# Patient Record
Sex: Female | Born: 1950 | Race: White | Hispanic: No | Marital: Single | Smoking: Never smoker
Health system: Southern US, Community
[De-identification: ages and names within clinical notes are randomized; demographics above are authoritative.]

## PROBLEM LIST (undated history)

## (undated) DIAGNOSIS — I2510 Atherosclerotic heart disease of native coronary artery without angina pectoris: Secondary | ICD-10-CM

## (undated) DIAGNOSIS — E782 Mixed hyperlipidemia: Secondary | ICD-10-CM

## (undated) DIAGNOSIS — I469 Cardiac arrest, cause unspecified: Secondary | ICD-10-CM

## (undated) DIAGNOSIS — M1990 Unspecified osteoarthritis, unspecified site: Secondary | ICD-10-CM

## (undated) DIAGNOSIS — IMO0001 Reserved for inherently not codable concepts without codable children: Secondary | ICD-10-CM

## (undated) DIAGNOSIS — J449 Chronic obstructive pulmonary disease, unspecified: Secondary | ICD-10-CM

## (undated) DIAGNOSIS — E039 Hypothyroidism, unspecified: Secondary | ICD-10-CM

## (undated) DIAGNOSIS — I219 Acute myocardial infarction, unspecified: Secondary | ICD-10-CM

## (undated) DIAGNOSIS — R0600 Dyspnea, unspecified: Secondary | ICD-10-CM

## (undated) DIAGNOSIS — I700 Atherosclerosis of aorta: Secondary | ICD-10-CM

## (undated) DIAGNOSIS — I10 Essential (primary) hypertension: Secondary | ICD-10-CM

## (undated) DIAGNOSIS — H9190 Unspecified hearing loss, unspecified ear: Secondary | ICD-10-CM

## (undated) DIAGNOSIS — K219 Gastro-esophageal reflux disease without esophagitis: Secondary | ICD-10-CM

## (undated) DIAGNOSIS — F319 Bipolar disorder, unspecified: Secondary | ICD-10-CM

## (undated) HISTORY — DX: Mixed hyperlipidemia: E78.2

## (undated) HISTORY — PX: TONSILLECTOMY: SUR1361

## (undated) HISTORY — PX: BACK SURGERY: SHX140

## (undated) HISTORY — DX: Chronic obstructive pulmonary disease, unspecified: J44.9

## (undated) HISTORY — DX: Atherosclerosis of aorta: I70.0

## (undated) HISTORY — PX: CERVICAL SPINE SURGERY: SHX589

## (undated) HISTORY — PX: CORONARY ANGIOPLASTY WITH STENT PLACEMENT: SHX49

## (undated) HISTORY — DX: Dyspnea, unspecified: R06.00

## (undated) HISTORY — PX: TOTAL ABDOMINAL HYSTERECTOMY W/ BILATERAL SALPINGOOPHORECTOMY: SHX83

## (undated) HISTORY — DX: Gastro-esophageal reflux disease without esophagitis: K21.9

## (undated) HISTORY — DX: Bipolar disorder, unspecified: F31.9

## (undated) HISTORY — DX: Essential (primary) hypertension: I10

---

## 2004-09-01 DIAGNOSIS — I469 Cardiac arrest, cause unspecified: Secondary | ICD-10-CM | POA: Insufficient documentation

## 2004-09-01 DIAGNOSIS — I219 Acute myocardial infarction, unspecified: Secondary | ICD-10-CM | POA: Insufficient documentation

## 2004-09-01 HISTORY — DX: Cardiac arrest, cause unspecified: I46.9

## 2004-09-01 HISTORY — DX: Acute myocardial infarction, unspecified: I21.9

## 2014-11-23 ENCOUNTER — Other Ambulatory Visit (HOSPITAL_COMMUNITY): Payer: Self-pay | Admitting: Neurological Surgery

## 2014-11-23 DIAGNOSIS — M810 Age-related osteoporosis without current pathological fracture: Secondary | ICD-10-CM

## 2014-12-05 ENCOUNTER — Ambulatory Visit (HOSPITAL_COMMUNITY)
Admission: RE | Admit: 2014-12-05 | Discharge: 2014-12-05 | Disposition: A | Discharge status: Home / Self Care | Payer: Medicare Other | Source: Ambulatory Visit | Attending: Internal Medicine | Admitting: Internal Medicine

## 2014-12-05 DIAGNOSIS — M810 Age-related osteoporosis without current pathological fracture: Secondary | ICD-10-CM | POA: Insufficient documentation

## 2015-03-07 ENCOUNTER — Other Ambulatory Visit: Payer: Self-pay | Admitting: Internal Medicine

## 2015-03-07 ENCOUNTER — Other Ambulatory Visit: Payer: Medicare Other

## 2015-03-07 DIAGNOSIS — M546 Pain in thoracic spine: Secondary | ICD-10-CM

## 2017-05-29 ENCOUNTER — Other Ambulatory Visit: Payer: Self-pay

## 2017-05-29 ENCOUNTER — Emergency Department (HOSPITAL_COMMUNITY): Payer: Medicare Other

## 2017-05-29 ENCOUNTER — Encounter (HOSPITAL_COMMUNITY): Payer: Self-pay | Admitting: *Deleted

## 2017-05-29 ENCOUNTER — Inpatient Hospital Stay (HOSPITAL_COMMUNITY)
Admission: EM | Admit: 2017-05-29 | Discharge: 2017-06-02 | DRG: 251 | Disposition: A | Discharge status: Home / Self Care | Payer: Medicare Other | Attending: Cardiology | Admitting: Cardiology

## 2017-05-29 DIAGNOSIS — G8929 Other chronic pain: Secondary | ICD-10-CM

## 2017-05-29 DIAGNOSIS — Z955 Presence of coronary angioplasty implant and graft: Secondary | ICD-10-CM

## 2017-05-29 DIAGNOSIS — I2510 Atherosclerotic heart disease of native coronary artery without angina pectoris: Secondary | ICD-10-CM | POA: Diagnosis present

## 2017-05-29 DIAGNOSIS — Z79899 Other long term (current) drug therapy: Secondary | ICD-10-CM | POA: Diagnosis not present

## 2017-05-29 DIAGNOSIS — G894 Chronic pain syndrome: Secondary | ICD-10-CM | POA: Diagnosis not present

## 2017-05-29 DIAGNOSIS — Z23 Encounter for immunization: Secondary | ICD-10-CM | POA: Diagnosis not present

## 2017-05-29 DIAGNOSIS — E039 Hypothyroidism, unspecified: Secondary | ICD-10-CM | POA: Diagnosis not present

## 2017-05-29 DIAGNOSIS — Z888 Allergy status to other drugs, medicaments and biological substances status: Secondary | ICD-10-CM

## 2017-05-29 DIAGNOSIS — Z91018 Allergy to other foods: Secondary | ICD-10-CM | POA: Diagnosis not present

## 2017-05-29 DIAGNOSIS — F319 Bipolar disorder, unspecified: Secondary | ICD-10-CM | POA: Diagnosis present

## 2017-05-29 DIAGNOSIS — I252 Old myocardial infarction: Secondary | ICD-10-CM

## 2017-05-29 DIAGNOSIS — Z8249 Family history of ischemic heart disease and other diseases of the circulatory system: Secondary | ICD-10-CM

## 2017-05-29 DIAGNOSIS — I200 Unstable angina: Secondary | ICD-10-CM

## 2017-05-29 DIAGNOSIS — E785 Hyperlipidemia, unspecified: Secondary | ICD-10-CM | POA: Diagnosis not present

## 2017-05-29 DIAGNOSIS — E782 Mixed hyperlipidemia: Secondary | ICD-10-CM | POA: Diagnosis present

## 2017-05-29 DIAGNOSIS — I25110 Atherosclerotic heart disease of native coronary artery with unstable angina pectoris: Principal | ICD-10-CM | POA: Diagnosis present

## 2017-05-29 DIAGNOSIS — Z9861 Coronary angioplasty status: Secondary | ICD-10-CM

## 2017-05-29 HISTORY — DX: Cardiac arrest, cause unspecified: I46.9

## 2017-05-29 HISTORY — DX: Hypothyroidism, unspecified: E03.9

## 2017-05-29 HISTORY — DX: Acute myocardial infarction, unspecified: I21.9

## 2017-05-29 HISTORY — DX: Reserved for inherently not codable concepts without codable children: IMO0001

## 2017-05-29 HISTORY — DX: Atherosclerotic heart disease of native coronary artery without angina pectoris: I25.10

## 2017-05-29 HISTORY — DX: Unspecified hearing loss, unspecified ear: H91.90

## 2017-05-29 LAB — I-STAT TROPONIN, ED: TROPONIN I, POC: 0 ng/mL (ref 0.00–0.08)

## 2017-05-29 LAB — BASIC METABOLIC PANEL
ANION GAP: 7 (ref 5–15)
BUN: 21 mg/dL — ABNORMAL HIGH (ref 6–20)
CO2: 25 mmol/L (ref 22–32)
Calcium: 9.2 mg/dL (ref 8.9–10.3)
Chloride: 104 mmol/L (ref 101–111)
Creatinine, Ser: 0.64 mg/dL (ref 0.44–1.00)
Glucose, Bld: 82 mg/dL (ref 65–99)
POTASSIUM: 3.9 mmol/L (ref 3.5–5.1)
SODIUM: 136 mmol/L (ref 135–145)

## 2017-05-29 LAB — CBC
HEMATOCRIT: 39.9 % (ref 36.0–46.0)
HEMOGLOBIN: 13 g/dL (ref 12.0–15.0)
MCH: 31.9 pg (ref 26.0–34.0)
MCHC: 32.6 g/dL (ref 30.0–36.0)
MCV: 98 fL (ref 78.0–100.0)
Platelets: 174 10*3/uL (ref 150–400)
RBC: 4.07 MIL/uL (ref 3.87–5.11)
RDW: 12.7 % (ref 11.5–15.5)
WBC: 3.8 10*3/uL — AB (ref 4.0–10.5)

## 2017-05-29 MED ORDER — ASPIRIN 81 MG PO CHEW: 324.0000 mg | CHEWABLE_TABLET | Freq: Once | ORAL | Status: DC

## 2017-05-29 MED ORDER — METHOCARBAMOL 750 MG PO TABS
375.0000 mg | ORAL_TABLET | Freq: Every day | ORAL | Status: DC
  Administered 2017-05-30 – 2017-06-02 (×21): 375 mg via ORAL
  Filled 2017-05-29 (×10): qty 1
  Filled 2017-05-29: qty 0.5
  Filled 2017-05-29 (×10): qty 1
  Filled 2017-05-29: qty 0.5
  Filled 2017-05-29 (×2): qty 1

## 2017-05-29 MED ORDER — ASPIRIN EC 81 MG PO TBEC
81.0000 mg | DELAYED_RELEASE_TABLET | Freq: Every day | ORAL | Status: DC
  Administered 2017-05-30 – 2017-06-02 (×4): 81 mg via ORAL
  Filled 2017-05-29 (×4): qty 1

## 2017-05-29 MED ORDER — CARVEDILOL 6.25 MG PO TABS
6.2500 mg | ORAL_TABLET | Freq: Two times a day (BID) | ORAL | Status: DC
  Administered 2017-05-30 – 2017-06-02 (×7): 6.25 mg via ORAL
  Filled 2017-05-29 (×7): qty 1

## 2017-05-29 MED ORDER — ESTRADIOL 1 MG PO TABS
0.5000 mg | ORAL_TABLET | Freq: Every morning | ORAL | Status: DC
  Administered 2017-05-30 – 2017-06-02 (×4): 0.5 mg via ORAL
  Filled 2017-05-29 (×4): qty 0.5

## 2017-05-29 MED ORDER — ONDANSETRON HCL 4 MG/2ML IJ SOLN: 4.0000 mg | Freq: Four times a day (QID) | INTRAMUSCULAR | Status: DC | PRN

## 2017-05-29 MED ORDER — HEPARIN (PORCINE) IN NACL 100-0.45 UNIT/ML-% IJ SOLN
850.0000 [IU]/h | INTRAMUSCULAR | Status: DC
  Administered 2017-05-29 – 2017-06-01 (×3): 850 [IU]/h via INTRAVENOUS
  Filled 2017-05-29 (×3): qty 250

## 2017-05-29 MED ORDER — NITROGLYCERIN IN D5W 200-5 MCG/ML-% IV SOLN
0.0000 ug/min | Freq: Once | INTRAVENOUS | Status: AC
Start: 2017-05-29 — End: 2017-05-29
  Administered 2017-05-29: 5 ug/min via INTRAVENOUS
  Filled 2017-05-29: qty 250

## 2017-05-29 MED ORDER — NITROGLYCERIN 0.4 MG SL SUBL
0.4000 mg | SUBLINGUAL_TABLET | SUBLINGUAL | Status: DC | PRN
  Administered 2017-05-31: 0.4 mg via SUBLINGUAL
  Filled 2017-05-29: qty 1

## 2017-05-29 MED ORDER — ATORVASTATIN CALCIUM 40 MG PO TABS
40.0000 mg | ORAL_TABLET | Freq: Every day | ORAL | Status: DC
  Administered 2017-05-30 – 2017-06-01 (×3): 40 mg via ORAL
  Filled 2017-05-29 (×2): qty 1

## 2017-05-29 MED ORDER — PNEUMOCOCCAL VAC POLYVALENT 25 MCG/0.5ML IJ INJ
0.5000 mL | INJECTION | INTRAMUSCULAR | Status: AC
  Administered 2017-05-30: 0.5 mL via INTRAMUSCULAR
  Filled 2017-05-29: qty 0.5

## 2017-05-29 MED ORDER — ACETAMINOPHEN 325 MG PO TABS
650.0000 mg | ORAL_TABLET | ORAL | Status: DC | PRN
  Administered 2017-05-29 – 2017-05-31 (×2): 650 mg via ORAL
  Filled 2017-05-29 (×2): qty 2

## 2017-05-29 MED ORDER — INFLUENZA VAC SPLIT HIGH-DOSE 0.5 ML IM SUSY
0.5000 mL | PREFILLED_SYRINGE | INTRAMUSCULAR | Status: AC
  Administered 2017-05-30: 0.5 mL via INTRAMUSCULAR
  Filled 2017-05-29: qty 0.5

## 2017-05-29 MED ORDER — HYDROCODONE-ACETAMINOPHEN 10-325 MG PO TABS: 1.0000 | ORAL_TABLET | ORAL | Status: DC | PRN

## 2017-05-29 MED ORDER — SERTRALINE HCL 100 MG PO TABS
100.0000 mg | ORAL_TABLET | Freq: Two times a day (BID) | ORAL | Status: DC
  Administered 2017-05-30 – 2017-06-02 (×7): 100 mg via ORAL
  Filled 2017-05-29 (×7): qty 1

## 2017-05-29 MED ORDER — DEXTROAMPHETAMINE SULFATE 5 MG PO TABS
10.0000 mg | ORAL_TABLET | Freq: Three times a day (TID) | ORAL | Status: DC
  Administered 2017-05-30 – 2017-06-02 (×10): 10 mg via ORAL
  Filled 2017-05-29 (×10): qty 2

## 2017-05-29 MED ORDER — HEPARIN BOLUS VIA INFUSION
4000.0000 [IU] | Freq: Once | INTRAVENOUS | Status: AC
  Administered 2017-05-29: 4000 [IU] via INTRAVENOUS
  Filled 2017-05-29: qty 4000
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
## 2017-05-30 ENCOUNTER — Encounter (HOSPITAL_COMMUNITY): Payer: Self-pay | Admitting: Cardiology

## 2017-05-30 DIAGNOSIS — E039 Hypothyroidism, unspecified: Secondary | ICD-10-CM | POA: Diagnosis present

## 2017-05-30 DIAGNOSIS — E782 Mixed hyperlipidemia: Secondary | ICD-10-CM | POA: Diagnosis present

## 2017-05-30 DIAGNOSIS — I2510 Atherosclerotic heart disease of native coronary artery without angina pectoris: Secondary | ICD-10-CM | POA: Insufficient documentation

## 2017-05-30 DIAGNOSIS — F319 Bipolar disorder, unspecified: Secondary | ICD-10-CM | POA: Diagnosis present

## 2017-05-30 DIAGNOSIS — E785 Hyperlipidemia, unspecified: Secondary | ICD-10-CM | POA: Diagnosis present

## 2017-05-30 HISTORY — DX: Atherosclerotic heart disease of native coronary artery without angina pectoris: I25.10

## 2017-05-30 LAB — CBC
HCT: 42 % (ref 36.0–46.0)
HEMATOCRIT: 39.8 % (ref 36.0–46.0)
HEMOGLOBIN: 13.2 g/dL (ref 12.0–15.0)
Hemoglobin: 13.8 g/dL (ref 12.0–15.0)
MCH: 32.7 pg (ref 26.0–34.0)
MCH: 32.2 pg (ref 26.0–34.0)
MCHC: 33.2 g/dL (ref 30.0–36.0)
MCHC: 32.9 g/dL (ref 30.0–36.0)
MCV: 98.5 fL (ref 78.0–100.0)
MCV: 98.1 fL (ref 78.0–100.0)
PLATELETS: 170 10*3/uL (ref 150–400)
Platelets: 150 10*3/uL (ref 150–400)
RBC: 4.04 MIL/uL (ref 3.87–5.11)
RBC: 4.28 MIL/uL (ref 3.87–5.11)
RDW: 12.5 % (ref 11.5–15.5)
RDW: 12.6 % (ref 11.5–15.5)
WBC: 3.9 10*3/uL — ABNORMAL LOW (ref 4.0–10.5)
WBC: 3.9 10*3/uL — ABNORMAL LOW (ref 4.0–10.5)

## 2017-05-30 LAB — BASIC METABOLIC PANEL
ANION GAP: 10 (ref 5–15)
BUN: 18 mg/dL (ref 6–20)
CHLORIDE: 104 mmol/L (ref 101–111)
CO2: 24 mmol/L (ref 22–32)
CREATININE: 0.6 mg/dL (ref 0.44–1.00)
Calcium: 8.9 mg/dL (ref 8.9–10.3)
GFR calc non Af Amer: >60 mL/min (ref >60)
Glucose, Bld: 101 mg/dL — ABNORMAL HIGH (ref 65–99)
Potassium: 3.9 mmol/L (ref 3.5–5.1)
SODIUM: 138 mmol/L (ref 135–145)

## 2017-05-30 LAB — TROPONIN I
Troponin I: <0.03 ng/mL (ref <0.03)
Troponin I: <0.03 ng/mL (ref <0.03)

## 2017-05-30 LAB — LIPID PANEL
Cholesterol: 195 mg/dL (ref 0–200)
HDL: 62 mg/dL (ref >40)
LDL Cholesterol: 123 mg/dL — ABNORMAL HIGH (ref 0–99)
Total CHOL/HDL Ratio: 3.1 RATIO
Triglycerides: 49 mg/dL (ref <150)
VLDL: 10 mg/dL (ref 0–40)

## 2017-05-30 LAB — PROTIME-INR
INR: 1.05
PROTHROMBIN TIME: 13.6 s (ref 11.4–15.2)

## 2017-05-30 LAB — HEPARIN LEVEL (UNFRACTIONATED)
HEPARIN UNFRACTIONATED: 0.62 [IU]/mL (ref 0.30–0.70)
Heparin Unfractionated: 0.52 IU/mL (ref 0.30–0.70)

## 2017-05-30 LAB — MRSA PCR SCREENING: MRSA by PCR: NEGATIVE

## 2017-05-30 MED ORDER — GABAPENTIN 100 MG PO CAPS
100.0000 mg | ORAL_CAPSULE | Freq: Three times a day (TID) | ORAL | Status: DC
  Administered 2017-05-30 – 2017-06-02 (×10): 100 mg via ORAL
  Filled 2017-05-30 (×11): qty 1

## 2017-05-30 MED ORDER — VALACYCLOVIR HCL 500 MG PO TABS
500.0000 mg | ORAL_TABLET | Freq: Two times a day (BID) | ORAL | Status: DC
  Administered 2017-05-30 – 2017-06-02 (×7): 500 mg via ORAL
  Filled 2017-05-30 (×8): qty 1

## 2017-05-30 MED ORDER — DIPHENHYDRAMINE HCL 25 MG PO CAPS
25.0000 mg | ORAL_CAPSULE | Freq: Once | ORAL | Status: AC
  Administered 2017-05-30: 25 mg via ORAL
  Filled 2017-05-30: qty 1

## 2017-05-30 NOTE — Progress Notes (Signed)
Md notified pt c/o itching at shot location on right arm.  Raised area.  Pt requesting Benadryl.  Will continue to monitor. Karena Addison T

## 2017-05-30 NOTE — Progress Notes (Signed)
Notified Md about pt's home medications that is not ordered.  Will continue to monitor Deborah [name]

## 2017-05-30 NOTE — Progress Notes (Signed)
Subjective:  Comes in yesterday with complaints of unstable angina.  She has a chronic pain syndrome and is very difficult to evaluate normally.  I have seen her sporadically in the office.  Currently pain-free but feels as if it might be starting back up again.  Objective:  Vital Signs in the last 24 hours: BP 121/71 (BP Location: Left Arm)   Pulse 73   Temp 97.8 F (36.6 C) (Oral)   Resp 19   Ht  (1.626 m)   Wt 63.9 kg (140 lb 14.4 oz)   SpO2 95%   BMI 24.19 kg/m   Physical Exam: Talkative white female lying in bed in no acute distress Lungs:  Clear Cardiac:  Regular rhythm, normal S1 and S2, no S3 Extremities:  No edema present, radial pulse strong on the right  Intake/Output from previous day: 09/28 0701 - 09/29 0700 In: 328.3 [P.O.:240; I.V.:88.3] Out: 402 [Urine:401; Stool:1]  Weight Filed Weights   05/29/17 1759 05/29/17 2254 05/30/17 0653  Weight: 65.3 kg (144 lb) 64.8 kg (142 lb 13.7 oz) 63.9 kg (140 lb 14.4 oz)    Lab Results: Basic Metabolic Panel:  Recent Labs  40/98/11 1420 05/30/17 0621  NA 136 138  K 3.9 3.9  CL 104 104  CO2 25 24  GLUCOSE 82 101*  BUN 21* 18  CREATININE 0.64 0.60   CBC:  Recent Labs  05/30/17 0236 05/30/17 0621  WBC 3.9* 3.9*  HGB 13.2 13.8  HCT 39.8 42.0  MCV 98.5 98.1  PLT 150 170   Cardiac Enzymes: Troponin (Point of Care Test)  Recent Labs  05/29/17 1448  TROPIPOC 0.00   Cardiac Panel (last 3 results)  Recent Labs  05/30/17 0024 05/30/17 0621 05/30/17 1014  TROPONINI <0.03 <0.03 <0.03    Telemetry: Personally reviewed, sinus rhythm  Assessment/Plan:  1. Chest pain suggestive of unstable angina 2.  Prior history of anterior infarction treated with drug-eluting stent to the LAD in 2006 in the setting of ventricular fibrillation 3. Bipolar disorder 4.  Chronic pain syndrome  Recommendations:  With or suggestive history of think she needs to have a repeat catheterization.  She had a normal  myocardial perfusion scan in March 2017 with no ischemia and EF of 62%.     Darden Palmer  MD [facility] Cardiology  05/30/2017, 5:18 PM

## 2017-05-30 NOTE — Progress Notes (Signed)
ANTICOAGULATION CONSULT NOTE  Pharmacy Consult for heparin Indication: chest pain/ACS  Allergies  Allergen Reactions  . Chocolate Anaphylaxis, Itching and Swelling  . Meloxicam Swelling    Face swells and flushes it also  . Demerol [Meperidine] Nausea Only  . Ibuprofen Other (See Comments)    Extreme heartburn results if not accompanied by an antacid    Patient Measurements: Height:  (162.6 cm) Weight: 142 lb 13.7 oz (64.8 kg) IBW/kg (Calculated) : 54.7 Heparin Dosing Weight: 65.3 kg  Vital Signs: Temp: 98.2 F (36.8 C) (09/29 0300) Temp Source: Oral (09/29 0300) BP: 137/84 (09/29 0300) Pulse Rate: 69 (09/29 0300)  Labs:  Recent Labs  05/29/17 1420 05/30/17 0024 05/30/17 0236  HGB 13.0  --  13.2  HCT 39.9  --  39.8  PLT 174  --  150  HEPARINUNFRC  --   --  0.52  CREATININE 0.64  --   --   TROPONINI  --  <0.03  --     Estimated Creatinine Clearance: 58.1 mL/min (by C-G formula based on SCr of 0.64 mg/dL).  Assessment: 66 y.o. female with chest pain for heparin   Goal of Therapy:  Heparin level 0.3-0.7 units/ml Monitor platelets by anticoagulation protocol: Yes     Plan:  Continue Heparin at current rate   Deborah [name], Deborah [name] 05/30/2017,3:55 AM

## 2017-05-30 NOTE — Progress Notes (Signed)
ANTICOAGULATION CONSULT NOTE  Pharmacy Consult for heparin Indication: chest pain/ACS  Allergies  Allergen Reactions  . Chocolate Anaphylaxis, Itching and Swelling  . Meloxicam Swelling    Face swells and flushes it also  . Demerol [Meperidine] Nausea Only  . Ibuprofen Other (See Comments)    Extreme heartburn results if not accompanied by an antacid    Patient Measurements: Height:  (162.6 cm) Weight: 140 lb 14.4 oz (63.9 kg) IBW/kg (Calculated) : 54.7 Heparin Dosing Weight: 65.3 kg  Vital Signs: Temp: 97.6 F (36.4 C) (09/29 0800) Temp Source: Axillary (09/29 0800) BP: 105/85 (09/29 0600) Pulse Rate: 68 (09/29 0600)  Labs:  Recent Labs  05/29/17 1420 05/30/17 0024 05/30/17 0236 05/30/17 0621 05/30/17 1014  HGB 13.0  --  13.2 13.8  --   HCT 39.9  --  39.8 42.0  --   PLT 174  --  150 170  --   LABPROT  --   --   --  13.6  --   INR  --   --   --  1.05  --   HEPARINUNFRC  --   --  0.52  --  0.62  CREATININE 0.64  --   --  0.60  --   TROPONINI  --  <0.03  --  <0.03 <0.03    Estimated Creatinine Clearance: 58.1 mL/min (by C-G formula based on SCr of 0.6 mg/dL).  Assessment: 66 y.o. female with chest pain for heparin.  Heparin level continues to be at goal this morning at 0.6. CBC stable, no bleeding noted.  Goal of Therapy:  Heparin level 0.3-0.7 units/ml Monitor platelets by anticoagulation protocol: Yes     Plan:  Continue Heparin at current rate   Sheppard Coil PharmD., BCPS Clinical Pharmacist Pager [phone] 05/30/2017 11:59 AM

## 2017-05-31 LAB — CBC
HEMATOCRIT: 41 % (ref 36.0–46.0)
HEMOGLOBIN: 13.3 g/dL (ref 12.0–15.0)
MCH: 31.8 pg (ref 26.0–34.0)
MCHC: 32.4 g/dL (ref 30.0–36.0)
MCV: 98.1 fL (ref 78.0–100.0)
Platelets: 154 10*3/uL (ref 150–400)
RBC: 4.18 MIL/uL (ref 3.87–5.11)
RDW: 12.6 % (ref 11.5–15.5)
WBC: 6.5 10*3/uL (ref 4.0–10.5)

## 2017-05-31 LAB — HEPARIN LEVEL (UNFRACTIONATED): HEPARIN UNFRACTIONATED: 0.57 [IU]/mL (ref 0.30–0.70)

## 2017-05-31 MED ORDER — SODIUM CHLORIDE 0.9 % WEIGHT BASED INFUSION
3.0000 mL/kg/h | INTRAVENOUS | Status: DC
  Administered 2017-06-01: 3 mL/kg/h via INTRAVENOUS

## 2017-05-31 MED ORDER — SODIUM CHLORIDE 0.9 % WEIGHT BASED INFUSION: 1.0000 mL/kg/h | INTRAVENOUS | Status: DC

## 2017-05-31 MED ORDER — ASPIRIN 81 MG PO CHEW
81.0000 mg | CHEWABLE_TABLET | ORAL | Status: AC
  Administered 2017-06-01: 81 mg via ORAL
  Filled 2017-05-31: qty 1

## 2017-05-31 MED ORDER — SODIUM CHLORIDE 0.9% FLUSH
3.0000 mL | Freq: Two times a day (BID) | INTRAVENOUS | Status: DC
  Administered 2017-05-31: 3 mL via INTRAVENOUS

## 2017-05-31 MED ORDER — SODIUM CHLORIDE 0.9 % IV SOLN: 250.0000 mL | INTRAVENOUS | Status: DC | PRN

## 2017-05-31 MED ORDER — SODIUM CHLORIDE 0.9% FLUSH: 3.0000 mL | INTRAVENOUS | Status: DC | PRN

## 2017-05-31 NOTE — Progress Notes (Signed)
ANTICOAGULATION CONSULT NOTE  Pharmacy Consult for heparin Indication: chest pain/ACS  Allergies  Allergen Reactions  . Chocolate Anaphylaxis, Itching and Swelling  . Meloxicam Swelling    Face swells and flushes it also  . Demerol [Meperidine] Nausea Only  . Ibuprofen Other (See Comments)    Extreme heartburn results if not accompanied by an antacid    Patient Measurements: Height:  (162.6 cm) Weight: 140 lb 14.4 oz (63.9 kg) IBW/kg (Calculated) : 54.7 Heparin Dosing Weight: 65.3 kg  Vital Signs: Temp: 98.4 F (36.9 C) (09/30 0814) Temp Source: Oral (09/30 0814) BP: 134/84 (09/30 0321) Pulse Rate: 87 (09/30 0321)  Labs:  Recent Labs  05/29/17 1420 05/30/17 0024 05/30/17 0236 05/30/17 0621 05/30/17 1014 05/31/17 0302  HGB 13.0  --  13.2 13.8  --  13.3  HCT 39.9  --  39.8 42.0  --  41.0  PLT 174  --  150 170  --  154  LABPROT  --   --   --  13.6  --   --   INR  --   --   --  1.05  --   --   HEPARINUNFRC  --   --  0.52  --  0.62 0.57  CREATININE 0.64  --   --  0.60  --   --   TROPONINI  --  <0.03  --  <0.03 <0.03  --     Estimated Creatinine Clearance: 58.1 mL/min (by C-G formula based on SCr of 0.6 mg/dL).  Assessment: 66 y.o. female with chest pain for heparin.  Heparin level continues to be at goal this morning at 0.5. CBC stable, no bleeding noted.  Goal of Therapy:  Heparin level 0.3-0.7 units/ml Monitor platelets by anticoagulation protocol: Yes     Plan:  Continue Heparin at current rate   Sheppard Coil PharmD., BCPS Clinical Pharmacist Pager [phone] 05/31/2017 9:29 AM

## 2017-05-31 NOTE — Progress Notes (Signed)
Subjective:  On heparin and IV nitroglycerin.  Had some chest pain earlier with left arm numbness that has resolved.  Awaiting catheterization tomorrow.  Objective:  Vital Signs in the last 24 hours: BP 134/84 (BP Location: Left Arm)   Pulse 87   Temp (!) 97.5 F (36.4 C) (Oral)   Resp 18   Ht  (1.626 m)   Wt 63.9 kg (140 lb 14.4 oz)   SpO2 99%   BMI 24.19 kg/m   Physical Exam: Talkative white female lying in bed in no acute distress Lungs:  Clear Cardiac:  Regular rhythm, normal S1 and S2, no S3 Extremities:  No edema present, radial pulse strong on the right  Intake/Output from previous day: 09/29 0701 - 09/30 0700 In: 675.5 [P.O.:480; I.V.:195.5] Out: 1200 [Urine:1200]  Weight Filed Weights   05/29/17 1759 05/29/17 2254 05/30/17 0653  Weight: 65.3 kg (144 lb) 64.8 kg (142 lb 13.7 oz) 63.9 kg (140 lb 14.4 oz)    Lab Results: Basic Metabolic Panel:  Recent Labs  16/10/96 1420 05/30/17 0621  NA 136 138  K 3.9 3.9  CL 104 104  CO2 25 24  GLUCOSE 82 101*  BUN 21* 18  CREATININE 0.64 0.60   CBC:  Recent Labs  05/30/17 0621 05/31/17 0302  WBC 3.9* 6.5  HGB 13.8 13.3  HCT 42.0 41.0  MCV 98.1 98.1  PLT 170 154   Cardiac Enzymes: Troponin (Point of Care Test)  Recent Labs  05/29/17 1448  TROPIPOC 0.00   Cardiac Panel (last 3 results)  Recent Labs  05/30/17 0024 05/30/17 0621 05/30/17 1014  TROPONINI <0.03 <0.03 <0.03    Telemetry: Personally reviewed, sinus rhythm  Assessment/Plan:  1. Chest pain suggestive of unstable angina 2.  Prior history of anterior infarction treated with drug-eluting stent to the LAD in 2006 in the setting of ventricular fibrillation 3. Bipolar disorder 4.  Chronic pain syndrome  Recommendations:  Cardiac catheterization was discussed with the patient fully including risks of myocardial infarction, death, stroke, bleeding, arrhythmia, dye allergy, renal insufficiency or bleeding.  The patient understands  and is willing to proceed.  Possibility of intervention at the same time also discussed with patient and she understands and is agreeable to proceed    W. Ashley Royalty  MD [facility]  05/31/2017, 12:54 PM

## 2017-06-01 ENCOUNTER — Other Ambulatory Visit: Payer: Self-pay

## 2017-06-01 ENCOUNTER — Encounter (HOSPITAL_COMMUNITY)
Admission: EM | Disposition: A | Discharge status: Home / Self Care | Payer: Self-pay | Source: Home / Self Care | Attending: Cardiology

## 2017-06-01 HISTORY — PX: CORONARY BALLOON ANGIOPLASTY: CATH118233

## 2017-06-01 HISTORY — PX: LEFT HEART CATH AND CORONARY ANGIOGRAPHY: CATH118249

## 2017-06-01 LAB — POCT ACTIVATED CLOTTING TIME: ACTIVATED CLOTTING TIME: 466 s

## 2017-06-01 LAB — CBC
HCT: 41.4 % (ref 36.0–46.0)
Hemoglobin: 13.6 g/dL (ref 12.0–15.0)
MCH: 32.3 pg (ref 26.0–34.0)
MCHC: 32.9 g/dL (ref 30.0–36.0)
MCV: 98.3 fL (ref 78.0–100.0)
Platelets: 166 10*3/uL (ref 150–400)
RBC: 4.21 MIL/uL (ref 3.87–5.11)
RDW: 12.7 % (ref 11.5–15.5)
WBC: 5.1 10*3/uL (ref 4.0–10.5)

## 2017-06-01 LAB — HEPARIN LEVEL (UNFRACTIONATED): HEPARIN UNFRACTIONATED: 0.47 [IU]/mL (ref 0.30–0.70)

## 2017-06-01 SURGERY — LEFT HEART CATH AND CORONARY ANGIOGRAPHY
Anesthesia: LOCAL

## 2017-06-01 MED ORDER — SODIUM CHLORIDE 0.9 % IV SOLN: 250.0000 mL | INTRAVENOUS | Status: DC | PRN

## 2017-06-01 MED ORDER — FENTANYL CITRATE (PF) 100 MCG/2ML IJ SOLN
INTRAMUSCULAR | Status: DC | PRN
  Administered 2017-06-01: 25 ug via INTRAVENOUS

## 2017-06-01 MED ORDER — FENTANYL CITRATE (PF) 100 MCG/2ML IJ SOLN
INTRAMUSCULAR | Status: AC
  Filled 2017-06-01: qty 2

## 2017-06-01 MED ORDER — HEPARIN (PORCINE) IN NACL 2-0.9 UNIT/ML-% IJ SOLN
INTRAMUSCULAR | Status: DC | PRN
  Administered 2017-06-01: 10 mL via INTRA_ARTERIAL

## 2017-06-01 MED ORDER — VERAPAMIL HCL 2.5 MG/ML IV SOLN
INTRAVENOUS | Status: AC
  Filled 2017-06-01: qty 2

## 2017-06-01 MED ORDER — ONDANSETRON HCL 4 MG/2ML IJ SOLN: 4.0000 mg | Freq: Four times a day (QID) | INTRAMUSCULAR | Status: DC | PRN

## 2017-06-01 MED ORDER — HEPARIN SODIUM (PORCINE) 1000 UNIT/ML IJ SOLN
INTRAMUSCULAR | Status: AC
  Filled 2017-06-01: qty 1

## 2017-06-01 MED ORDER — ACETAMINOPHEN 325 MG PO TABS
650.0000 mg | ORAL_TABLET | ORAL | Status: DC | PRN
Start: 2017-06-01 — End: 2017-06-02
  Administered 2017-06-01: 650 mg via ORAL
  Filled 2017-06-01: qty 2

## 2017-06-01 MED ORDER — BIVALIRUDIN BOLUS VIA INFUSION - CUPID
INTRAVENOUS | Status: DC | PRN
  Administered 2017-06-01: 46.95 mg via INTRAVENOUS

## 2017-06-01 MED ORDER — LABETALOL HCL 5 MG/ML IV SOLN: 10.0000 mg | INTRAVENOUS | Status: AC | PRN

## 2017-06-01 MED ORDER — DIAZEPAM 5 MG PO TABS: 5.0000 mg | ORAL_TABLET | Freq: Four times a day (QID) | ORAL | Status: DC | PRN

## 2017-06-01 MED ORDER — CLOPIDOGREL BISULFATE 75 MG PO TABS
75.0000 mg | ORAL_TABLET | Freq: Every day | ORAL | Status: DC
  Administered 2017-06-02: 75 mg via ORAL
  Filled 2017-06-01: qty 1

## 2017-06-01 MED ORDER — SODIUM CHLORIDE 0.9 % IV SOLN
INTRAVENOUS | Status: AC | PRN
  Administered 2017-06-01: 100 mL/h via INTRAVENOUS

## 2017-06-01 MED ORDER — SODIUM CHLORIDE 0.9 % IV SOLN
INTRAVENOUS | Status: DC
  Administered 2017-06-01: 18:00:00 via INTRAVENOUS

## 2017-06-01 MED ORDER — NITROGLYCERIN 1 MG/10 ML FOR IR/CATH LAB
INTRA_ARTERIAL | Status: AC
  Filled 2017-06-01: qty 10

## 2017-06-01 MED ORDER — SODIUM CHLORIDE 0.9% FLUSH: 3.0000 mL | INTRAVENOUS | Status: DC | PRN

## 2017-06-01 MED ORDER — IOPAMIDOL (ISOVUE-370) INJECTION 76%
INTRAVENOUS | Status: AC
  Filled 2017-06-01: qty 100

## 2017-06-01 MED ORDER — MIDAZOLAM HCL 2 MG/2ML IJ SOLN
INTRAMUSCULAR | Status: DC | PRN
  Administered 2017-06-01: 1 mg via INTRAVENOUS

## 2017-06-01 MED ORDER — SODIUM CHLORIDE 0.9% FLUSH
3.0000 mL | Freq: Two times a day (BID) | INTRAVENOUS | Status: DC
  Administered 2017-06-01: 3 mL via INTRAVENOUS

## 2017-06-01 MED ORDER — LIDOCAINE HCL 2 % IJ SOLN
INTRAMUSCULAR | Status: AC
  Filled 2017-06-01: qty 10

## 2017-06-01 MED ORDER — MIDAZOLAM HCL 2 MG/2ML IJ SOLN
INTRAMUSCULAR | Status: AC
  Filled 2017-06-01: qty 2

## 2017-06-01 MED ORDER — NITROGLYCERIN 1 MG/10 ML FOR IR/CATH LAB
INTRA_ARTERIAL | Status: DC | PRN
  Administered 2017-06-01 (×2): 200 ug via INTRACORONARY

## 2017-06-01 MED ORDER — BIVALIRUDIN TRIFLUOROACETATE 250 MG IV SOLR
INTRAVENOUS | Status: AC
  Filled 2017-06-01: qty 250

## 2017-06-01 MED ORDER — CLOPIDOGREL BISULFATE 300 MG PO TABS
ORAL_TABLET | ORAL | Status: AC
  Filled 2017-06-01: qty 2

## 2017-06-01 MED ORDER — HEPARIN (PORCINE) IN NACL 2-0.9 UNIT/ML-% IJ SOLN
INTRAMUSCULAR | Status: AC
  Filled 2017-06-01: qty 1000

## 2017-06-01 MED ORDER — CLOPIDOGREL BISULFATE 300 MG PO TABS
ORAL_TABLET | ORAL | Status: DC | PRN
  Administered 2017-06-01: 600 mg via ORAL

## 2017-06-01 MED ORDER — LIDOCAINE HCL (PF) 1 % IJ SOLN
INTRAMUSCULAR | Status: DC | PRN
  Administered 2017-06-01: 2 mL

## 2017-06-01 MED ORDER — HYDRALAZINE HCL 20 MG/ML IJ SOLN: 5.0000 mg | INTRAMUSCULAR | Status: AC | PRN

## 2017-06-01 MED ORDER — SODIUM CHLORIDE 0.9 % IV SOLN
INTRAVENOUS | Status: AC | PRN
  Administered 2017-06-01: 1.75 mg/kg/h via INTRAVENOUS

## 2017-06-01 MED ORDER — HEPARIN SODIUM (PORCINE) 1000 UNIT/ML IJ SOLN
INTRAMUSCULAR | Status: DC | PRN
  Administered 2017-06-01: 3000 [IU] via INTRAVENOUS
  Administered 2017-06-01: 1000 [IU] via INTRAVENOUS

## 2017-06-01 MED ORDER — ASPIRIN 81 MG PO CHEW: 81.0000 mg | CHEWABLE_TABLET | Freq: Every day | ORAL | Status: DC

## 2017-06-01 SURGICAL SUPPLY — 18 items
BALLN WOLVERINE 2.50X10 (BALLOONS) ×2
BALLN WOLVERINE 3.00X10 (BALLOONS) ×2
BALLOON WOLVERINE 2.50X10 (BALLOONS) ×1 IMPLANT
BALLOON WOLVERINE 3.00X10 (BALLOONS) ×1 IMPLANT
CATH INFINITI 5FR ANG PIGTAIL (CATHETERS) ×2 IMPLANT
CATH OPTITORQUE TIG 4.0 5F (CATHETERS) ×2 IMPLANT
CATH VISTA GUIDE 6FR XBLAD3.5 (CATHETERS) ×2 IMPLANT
DEVICE RAD COMP TR BAND LRG (VASCULAR PRODUCTS) ×2 IMPLANT
DEVICE RAD TR BAND REGULAR (VASCULAR PRODUCTS) ×2 IMPLANT
GLIDESHEATH SLEND SS 6F .021 (SHEATH) ×2 IMPLANT
GUIDEWIRE INQWIRE 1.5J.035X260 (WIRE) ×1 IMPLANT
INQWIRE 1.5J .035X260CM (WIRE) ×2
KIT ENCORE 26 ADVANTAGE (KITS) ×2 IMPLANT
KIT HEART LEFT (KITS) ×2 IMPLANT
PACK CARDIAC CATHETERIZATION (CUSTOM PROCEDURE TRAY) ×2 IMPLANT
TRANSDUCER W/STOPCOCK (MISCELLANEOUS) ×2 IMPLANT
TUBING CIL FLEX 10 FLL-RA (TUBING) ×2 IMPLANT
WIRE MARVEL STR TIP 190CM (WIRE) ×2 IMPLANT

## 2017-06-01 NOTE — Progress Notes (Addendum)
ANTICOAGULATION CONSULT NOTE  Pharmacy Consult for heparin Indication: chest pain/ACS  Allergies  Allergen Reactions  . Chocolate Anaphylaxis, Itching and Swelling  . Meloxicam Swelling    Face swells and flushes it also  . Demerol [Meperidine] Nausea Only  . Ibuprofen Other (See Comments)    Extreme heartburn results if not accompanied by an antacid    Patient Measurements: Height:  (162.6 cm) Weight: 137 lb 14.4 oz (62.6 kg) IBW/kg (Calculated) : 54.7 Heparin Dosing Weight: 65.3 kg  Vital Signs: Temp: 98.3 F (36.8 C) (10/01 0715) Temp Source: Oral (10/01 0715) BP: 115/77 (10/01 0715) Pulse Rate: 86 (10/01 0715)  Labs:  Recent Labs  05/29/17 1420 05/30/17 0024  05/30/17 0621 05/30/17 1014 05/31/17 0302 06/01/17 0139 06/01/17 0307  HGB 13.0  --   < > 13.8  --  13.3  --  13.6  HCT 39.9  --   < > 42.0  --  41.0  --  41.4  PLT 174  --   < > 170  --  154  --  166  LABPROT  --   --   --  13.6  --   --   --   --   INR  --   --   --  1.05  --   --   --   --   HEPARINUNFRC  --   --   < >  --  0.62 0.57 0.47  --   CREATININE 0.64  --   --  0.60  --   --   --   --   TROPONINI  --  <0.03  --  <0.03 <0.03  --   --   --   < > = values in this interval not displayed.  Estimated Creatinine Clearance: 58.1 mL/min (by C-G formula based on SCr of 0.6 mg/dL).  Assessment: 66 y.o. female with chest pain for heparin.  Heparin level remains therapeutic this am at 0.47. CBC stable. Noted plans for cath today.   Goal of Therapy:  Heparin level 0.3-0.7 units/ml Monitor platelets by anticoagulation protocol: Yes     Plan:  1. Continue heparin infusion at 850 units/hr  2. Daily heparin level  3. Noted plans for cath today   Pollyann Samples, PharmD, BCPS 06/01/2017, 8:05 AM

## 2017-06-02 ENCOUNTER — Encounter (HOSPITAL_COMMUNITY): Payer: Self-pay | Admitting: Cardiovascular Disease

## 2017-06-02 DIAGNOSIS — G894 Chronic pain syndrome: Secondary | ICD-10-CM

## 2017-06-02 HISTORY — DX: Chronic pain syndrome: G89.4

## 2017-06-02 LAB — CBC
HCT: 39.6 % (ref 36.0–46.0)
HEMOGLOBIN: 12.9 g/dL (ref 12.0–15.0)
MCH: 32 pg (ref 26.0–34.0)
MCHC: 32.6 g/dL (ref 30.0–36.0)
MCV: 98.3 fL (ref 78.0–100.0)
PLATELETS: 146 10*3/uL — AB (ref 150–400)
RBC: 4.03 MIL/uL (ref 3.87–5.11)
RDW: 12.6 % (ref 11.5–15.5)
WBC: 4.9 10*3/uL (ref 4.0–10.5)

## 2017-06-02 LAB — BASIC METABOLIC PANEL
ANION GAP: 5 (ref 5–15)
BUN: 16 mg/dL (ref 6–20)
CALCIUM: 8.8 mg/dL — AB (ref 8.9–10.3)
CHLORIDE: 107 mmol/L (ref 101–111)
CO2: 24 mmol/L (ref 22–32)
Creatinine, Ser: 0.6 mg/dL (ref 0.44–1.00)
GFR calc Af Amer: >60 mL/min (ref >60)
Glucose, Bld: 94 mg/dL (ref 65–99)
POTASSIUM: 4 mmol/L (ref 3.5–5.1)
Sodium: 136 mmol/L (ref 135–145)

## 2017-06-02 MED ORDER — ATORVASTATIN CALCIUM 40 MG PO TABS: 40.0000 mg | ORAL_TABLET | Freq: Every day | ORAL | 12 refills | Status: DC

## 2017-06-02 MED ORDER — ASPIRIN 81 MG PO TBEC: 81.0000 mg | DELAYED_RELEASE_TABLET | Freq: Every day | ORAL | Status: DC

## 2017-06-02 MED ORDER — CLOPIDOGREL BISULFATE 75 MG PO TABS: 75.0000 mg | ORAL_TABLET | Freq: Every day | ORAL | 12 refills | Status: DC

## 2017-06-02 MED ORDER — NITROGLYCERIN 0.4 MG SL SUBL: 0.4000 mg | SUBLINGUAL_TABLET | SUBLINGUAL | 12 refills | Status: DC | PRN

## 2017-06-02 MED FILL — Heparin Sodium (Porcine) 2 Unit/ML in Sodium Chloride 0.9%: INTRAMUSCULAR | Qty: 500 | Status: AC

## 2017-06-02 MED FILL — Lidocaine HCl Local Inj 2%: INTRAMUSCULAR | Qty: 10 | Status: AC

## 2017-06-02 NOTE — Progress Notes (Signed)
Pt discharged to home, all discharge instructions provided to patient with no questions at this time.

## 2017-06-02 NOTE — Progress Notes (Signed)
CARDIAC REHAB PHASE I   PRE:  Rate/Rhythm: 87 SR  BP:  Sitting: 108/75        SaO2: 95 RA  MODE:  Ambulation: 340 ft   POST:  Rate/Rhythm: 88 SR  BP:  Sitting: 120/74         SaO2: 95 RA  Pt ambulated 340 ft on RA, rollator, assist x1, steady gait, tolerated well.  Pt c/o of "feeling a little hot in her chest," denies any other complaints, declined rest stop, symptoms resolved with rest. Completed PCI education. Reviewed risk factors, anti-platelet therapy, activity restrictions, ntg, exercise, heart healthy diet and phase 2 cardiac rehab. Pt verbalized understanding. Pt agrees to phase 2 cardiac rehab referral, will send to [facility]. Pt to bed per pt request after walk, call bell within reach. Will follow.     [id number] Joylene Grapes, RN, BSN 06/02/2017 11:15 AM

## 2017-06-02 NOTE — Care Management Note (Signed)
Case Management Note  Patient Details  Name: Konya Fauble MRN: [id number] Date of Birth: 1951-07-23  Subjective/Objective:        Pt admitted with angina - s/p cath            Action/Plan:  PTA from home independent alone - pt has both rollator and cane in the home.  Pts friend Merdis Delay will transport pt back home today.  Pt has PCP and denied barriers to obtaining/paying for medications as prescribed.     Expected Discharge Date:  06/02/17               Expected Discharge Plan:  Home/Self Care  In-House Referral:     Discharge planning Services  CM Consult  Post Acute Care Choice:    Choice offered to:     DME Arranged:    DME Agency:     HH Arranged:    HH Agency:     Status of Service:  Completed, signed off  If discussed at [employer] of Stay Meetings, dates discussed:    Additional Comments:  Cherylann Parr, RN 06/02/2017, 12:17 PM
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
## 2017-06-03 ENCOUNTER — Telehealth (HOSPITAL_COMMUNITY): Payer: Self-pay

## 2017-06-03 NOTE — Telephone Encounter (Signed)
Patient insurance is active and benefits verified. Patient insurance is Medicare A/B - no co-payment, deductible $183.00/$183.00 has been met, 20% co-insurance, no out of pocket and no pre-authorization. Passport/reference [id number].

## 2017-06-10 ENCOUNTER — Telehealth (HOSPITAL_COMMUNITY): Payer: Self-pay

## 2017-06-10 NOTE — Telephone Encounter (Signed)
I called patient to discuss scheduling for cardiac rehab. Patient is very interested, patient is getting ready to go to [place] with family and is not sure of the dates. Patient stated she was going to contact her daughter and call me back to schedule. Patient given my contact information to return call.

## 2017-07-06 ENCOUNTER — Telehealth (HOSPITAL_COMMUNITY): Payer: Self-pay

## 2017-07-06 ENCOUNTER — Encounter (HOSPITAL_COMMUNITY): Payer: Self-pay

## 2017-07-06 NOTE — Telephone Encounter (Signed)
Attempted to call patient in regards to Cardiac Rehab - Lm on Vm. Will send letter.

## 2017-07-07 NOTE — Telephone Encounter (Signed)
Patient returned phone call this morning in regards to Cardiac Rehab - Called patient back and she is interested in the program. Scheduled orientation on 07/16/2017 at 1:30pm. Patient will be attending the 1:15pm exc class.

## 2017-07-14 ENCOUNTER — Telehealth (HOSPITAL_COMMUNITY): Payer: Self-pay | Admitting: Pharmacy Technician

## 2017-07-14 NOTE — Telephone Encounter (Signed)
Cardiac Rehab Medication Review by a Pharmacist  Does the patient  feel that his/her medications are working for him/her?  yes  Has the patient been experiencing any side effects to the medications prescribed?  no  Does the patient measure his/her own blood pressure or blood glucose at home?  no   Does the patient have any problems obtaining medications due to transportation or finances?   no  Understanding of regimen: good Understanding of indications: good Potential of compliance: good    Pharmacist comments: Patient has no complaints or changes with her medication regimen at this time, and was able to go through her medications without any prompting.     Dion BodyOriet, Parisa Pinela M 07/14/2017 6:13 PM

## 2017-07-16 ENCOUNTER — Ambulatory Visit (HOSPITAL_COMMUNITY): Payer: Medicare Other

## 2017-07-20 ENCOUNTER — Ambulatory Visit (HOSPITAL_COMMUNITY): Payer: Medicare Other

## 2017-07-22 ENCOUNTER — Ambulatory Visit (HOSPITAL_COMMUNITY): Payer: Medicare Other

## 2017-07-27 ENCOUNTER — Ambulatory Visit (HOSPITAL_COMMUNITY): Payer: Medicare Other

## 2017-07-29 ENCOUNTER — Ambulatory Visit (HOSPITAL_COMMUNITY): Payer: Medicare Other

## 2017-07-31 ENCOUNTER — Ambulatory Visit (HOSPITAL_COMMUNITY): Payer: Medicare Other

## 2017-08-03 ENCOUNTER — Ambulatory Visit (HOSPITAL_COMMUNITY): Payer: Medicare Other

## 2017-08-05 ENCOUNTER — Ambulatory Visit (HOSPITAL_COMMUNITY): Payer: Medicare Other

## 2017-08-07 ENCOUNTER — Ambulatory Visit (HOSPITAL_COMMUNITY): Payer: Medicare Other

## 2017-08-10 ENCOUNTER — Ambulatory Visit (HOSPITAL_COMMUNITY): Payer: Medicare Other

## 2017-08-12 ENCOUNTER — Ambulatory Visit (HOSPITAL_COMMUNITY): Payer: Medicare Other

## 2017-08-14 ENCOUNTER — Ambulatory Visit (HOSPITAL_COMMUNITY): Payer: Medicare Other

## 2017-08-17 ENCOUNTER — Ambulatory Visit (HOSPITAL_COMMUNITY): Payer: Medicare Other

## 2017-08-19 ENCOUNTER — Ambulatory Visit (HOSPITAL_COMMUNITY): Payer: Medicare Other

## 2017-08-21 ENCOUNTER — Ambulatory Visit (HOSPITAL_COMMUNITY): Payer: Medicare Other

## 2017-08-26 ENCOUNTER — Ambulatory Visit (HOSPITAL_COMMUNITY): Payer: Medicare Other

## 2017-08-28 ENCOUNTER — Ambulatory Visit (HOSPITAL_COMMUNITY): Payer: Medicare Other

## 2017-08-31 ENCOUNTER — Ambulatory Visit (HOSPITAL_COMMUNITY): Payer: Medicare Other

## 2017-09-02 ENCOUNTER — Ambulatory Visit (HOSPITAL_COMMUNITY): Payer: Medicare Other

## 2017-09-04 ENCOUNTER — Ambulatory Visit (HOSPITAL_COMMUNITY): Payer: Medicare Other

## 2017-09-07 ENCOUNTER — Ambulatory Visit (HOSPITAL_COMMUNITY): Payer: Medicare Other

## 2017-09-09 ENCOUNTER — Ambulatory Visit (HOSPITAL_COMMUNITY): Payer: Medicare Other

## 2017-09-11 ENCOUNTER — Ambulatory Visit (HOSPITAL_COMMUNITY): Payer: Medicare Other

## 2017-09-14 ENCOUNTER — Ambulatory Visit (HOSPITAL_COMMUNITY): Payer: Medicare Other

## 2017-09-16 ENCOUNTER — Ambulatory Visit (HOSPITAL_COMMUNITY): Payer: Medicare Other

## 2017-09-18 ENCOUNTER — Ambulatory Visit (HOSPITAL_COMMUNITY): Payer: Medicare Other

## 2017-09-21 ENCOUNTER — Ambulatory Visit (HOSPITAL_COMMUNITY): Payer: Medicare Other

## 2017-09-23 ENCOUNTER — Ambulatory Visit (HOSPITAL_COMMUNITY): Payer: Medicare Other

## 2017-09-25 ENCOUNTER — Ambulatory Visit (HOSPITAL_COMMUNITY): Payer: Medicare Other

## 2017-09-28 ENCOUNTER — Telehealth (HOSPITAL_COMMUNITY): Payer: Self-pay

## 2017-09-28 ENCOUNTER — Ambulatory Visit (HOSPITAL_COMMUNITY): Payer: Medicare Other

## 2017-09-28 NOTE — Telephone Encounter (Signed)
Attempted to call patient to reschedule orientation - lm on vm °

## 2017-09-29 ENCOUNTER — Ambulatory Visit (HOSPITAL_COMMUNITY): Payer: Medicare Other

## 2017-09-29 ENCOUNTER — Telehealth (HOSPITAL_COMMUNITY): Payer: Self-pay

## 2017-09-29 NOTE — Telephone Encounter (Signed)
Patient returned phone call to reschedule orientation. Orientation is now on 11/10/2017 at 1:30pm. Patient will attend the 11:15am exc class.

## 2017-09-30 ENCOUNTER — Ambulatory Visit (HOSPITAL_COMMUNITY): Payer: Medicare Other

## 2017-10-02 ENCOUNTER — Ambulatory Visit (HOSPITAL_COMMUNITY): Payer: Medicare Other

## 2017-10-05 ENCOUNTER — Ambulatory Visit (HOSPITAL_COMMUNITY): Payer: Medicare Other

## 2017-10-07 ENCOUNTER — Ambulatory Visit (HOSPITAL_COMMUNITY): Payer: Medicare Other

## 2017-10-09 ENCOUNTER — Ambulatory Visit (HOSPITAL_COMMUNITY): Payer: Medicare Other

## 2017-10-12 ENCOUNTER — Ambulatory Visit (HOSPITAL_COMMUNITY): Payer: Medicare Other

## 2017-10-14 ENCOUNTER — Ambulatory Visit (HOSPITAL_COMMUNITY): Payer: Medicare Other

## 2017-10-16 ENCOUNTER — Ambulatory Visit (HOSPITAL_COMMUNITY): Payer: Medicare Other

## 2017-10-19 ENCOUNTER — Ambulatory Visit (HOSPITAL_COMMUNITY): Payer: Medicare Other

## 2017-10-21 ENCOUNTER — Ambulatory Visit (HOSPITAL_COMMUNITY): Payer: Medicare Other

## 2017-10-23 ENCOUNTER — Ambulatory Visit (HOSPITAL_COMMUNITY): Payer: Medicare Other

## 2017-10-26 ENCOUNTER — Ambulatory Visit (HOSPITAL_COMMUNITY): Payer: Medicare Other

## 2017-10-28 ENCOUNTER — Ambulatory Visit (HOSPITAL_COMMUNITY): Payer: Medicare Other

## 2017-10-30 ENCOUNTER — Ambulatory Visit (HOSPITAL_COMMUNITY): Payer: Medicare Other

## 2017-11-02 ENCOUNTER — Ambulatory Visit (HOSPITAL_COMMUNITY): Payer: Medicare Other

## 2017-11-04 ENCOUNTER — Ambulatory Visit (HOSPITAL_COMMUNITY): Payer: Medicare Other

## 2017-11-06 ENCOUNTER — Ambulatory Visit (HOSPITAL_COMMUNITY): Payer: Medicare Other

## 2017-11-06 ENCOUNTER — Telehealth (HOSPITAL_COMMUNITY): Payer: Self-pay

## 2017-11-09 ENCOUNTER — Ambulatory Visit (HOSPITAL_COMMUNITY): Payer: Medicare Other

## 2017-11-09 NOTE — Telephone Encounter (Signed)
Unable to reach patient to conduction mediation reconciliation prior to cardiac rehab visit.   Deborah PotterSabrina Marilea [name], PharmD Pharmacy Resident Pager: [phone]

## 2017-11-10 ENCOUNTER — Encounter (HOSPITAL_COMMUNITY): Payer: Self-pay

## 2017-11-10 ENCOUNTER — Encounter (HOSPITAL_COMMUNITY)
Admission: RE | Admit: 2017-11-10 | Discharge: 2017-11-10 | Disposition: A | Discharge status: Home / Self Care | Payer: Medicare Other | Source: Ambulatory Visit | Attending: Cardiology | Admitting: Cardiology

## 2017-11-10 VITALS — Ht 62.5 in | Wt 144.2 lb

## 2017-11-10 DIAGNOSIS — Z9861 Coronary angioplasty status: Secondary | ICD-10-CM | POA: Diagnosis present

## 2017-11-10 DIAGNOSIS — Z48812 Encounter for surgical aftercare following surgery on the circulatory system: Secondary | ICD-10-CM | POA: Diagnosis not present
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
## 2017-11-10 NOTE — Progress Notes (Signed)
Cardiac Rehab Medication Review by a Nurse  Does the patient  feel that his/her medications are working for him/her?  yes  Has the patient been experiencing any side effects to the medications prescribed?  no  Does the patient measure his/her own blood pressure or blood glucose at home?  yes   Does the patient have any problems obtaining medications due to transportation or finances?   yes  Understanding of regimen: excellent Understanding of indications: excellent Potential of compliance: excellent    Nurse comments: Deborah AddisonKatie is taking her medications as prescribed.Deborah LighterMaria Arnette Driggs, RN,BSN 11/11/2017 5:14 PM    Deborah BruceMaria Walden Kushal [name] 11/10/2017 3:32 PM

## 2017-11-10 NOTE — Progress Notes (Signed)
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 Telemetry-sinus rhythm.  Asymptomatic.Deveron Furlong, RN, BSN Cardiac Pulmonary Rehab 11/10/17 4:29 PM

## 2017-11-11 ENCOUNTER — Ambulatory Visit (HOSPITAL_COMMUNITY): Payer: Medicare Other

## 2017-11-13 ENCOUNTER — Ambulatory Visit (HOSPITAL_COMMUNITY): Payer: Medicare Other

## 2017-11-16 ENCOUNTER — Encounter (HOSPITAL_COMMUNITY): Payer: Self-pay

## 2017-11-16 ENCOUNTER — Ambulatory Visit (HOSPITAL_COMMUNITY): Payer: Medicare Other

## 2017-11-16 ENCOUNTER — Encounter (HOSPITAL_COMMUNITY)
Admission: RE | Admit: 2017-11-16 | Discharge: 2017-11-16 | Disposition: A | Discharge status: Home / Self Care | Payer: Medicare Other | Source: Ambulatory Visit | Attending: Cardiology | Admitting: Cardiology

## 2017-11-16 DIAGNOSIS — Z9861 Coronary angioplasty status: Secondary | ICD-10-CM

## 2017-11-16 DIAGNOSIS — Z48812 Encounter for surgical aftercare following surgery on the circulatory system: Secondary | ICD-10-CM | POA: Diagnosis not present

## 2017-11-16 NOTE — Progress Notes (Signed)
Daily Session Note  Patient Details  Name: Deborah [name] MRN: [id number] Date of Birth: [birth year] Referring Provider:     CARDIAC REHAB PHASE II ORIENTATION from 11/10/2017 in Day  Referring Provider  Tollie Eth MD      Encounter Date: 11/16/2017  Check In: Session Check In - 11/16/17 1153      Check-In   Location  [facility]    Staff Present  Amber Fair, MS, ACSM RCEP, Exercise Physiologist;Tyara Nevels, MS,ACSM CEP, Exercise Physiologist;Other;Maria Venetia Maxon, RN, BSN    Supervising physician immediately available to respond to emergencies  [facility] immediately available    Physician(s)  Dr. Wendee Beavers     Medication changes reported      No    Fall or balance concerns reported     No    Tobacco Cessation  No Change    Warm-up and Cool-down  Performed as group-led instruction    Resistance Training Performed  Yes    VAD Patient?  No      Pain Assessment   Currently in Pain?  No/denies    Multiple Pain Sites  No       Capillary Blood Glucose: No results found for this or any previous visit (from the past 24 hour(s)).    Social History   Tobacco Use  Smoking Status Never Smoker  Smokeless Tobacco Never Used    Goals Met:  Exercise tolerated well  Goals Unmet:  Not Applicable  Comments: Pt started cardiac rehab today.  Pt tolerated light exercise without difficulty. VSS, telemetry-SR, asymptomatic.  Medication list reconciled. Pt denies barriers to medicaiton compliance.  PSYCHOSOCIAL ASSESSMENT:  PHQ-0. Pt exhibits positive coping skills, hopeful outlook with supportive family. No psychosocial needs identified at this time, no psychosocial interventions necessary.   Pt oriented to exercise equipment and routine.    Understanding verbalized.    Dr. Fransico Him is Medical Director for Cardiac Rehab at [facility].>

## 2017-11-17 NOTE — Progress Notes (Addendum)
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 goal;Weight Loss: Understanding of general recommendations for a balanced deficit meal plan, which promotes 1-2 lb weight loss per week and includes a negative energy balance of [phone] kcal/d;Understanding recommendations for meals to include 15-35% energy as protein, 25-35% energy from fat, 35-60% energy from carbohydrates, less than 200mg  of dietary cholesterol, 20-35 gm of total fiber daily    Hypertension  Yes    Intervention  Provide education on lifestyle modifcations including regular physical activity/exercise, weight management, moderate sodium restriction and increased consumption of fresh fruit, vegetables, and low fat dairy, alcohol moderation, and smoking cessation.;Monitor prescription use compliance.    Expected Outcomes  Short Term:  Continued assessment and intervention until BP is < 140/80mm HG in hypertensive participants. < 130/68mm HG in hypertensive participants with diabetes, heart failure or chronic kidney disease.;Long Term: Maintenance of blood pressure at goal levels.    Lipids  Yes    Intervention  Provide education and support for participant on nutrition & aerobic/resistive exercise along with prescribed medications to achieve LDL 70mg , HDL >40mg .    Expected Outcomes  Short Term: Participant states understanding of desired cholesterol values and is compliant with medications prescribed. Participant is following exercise prescription and nutrition guidelines.;Long Term: Cholesterol controlled with medications  as prescribed, with individualized exercise RX and with personalized nutrition plan. Value goals: LDL < 70mg , HDL > 40 mg.    Stress  Yes    Intervention  Offer individual and/or small group education and counseling on adjustment to heart disease, stress management and health-related lifestyle change. Teach and support self-help strategies.;Refer participants experiencing significant psychosocial distress to appropriate mental health specialists for further evaluation and treatment. When possible, include family members and significant others in education/counseling sessions.    Expected Outcomes  Short Term: Participant demonstrates changes in health-related behavior, relaxation and other stress management skills, ability to obtain effective social support, and compliance with psychotropic medications if prescribed.;Long Term: Emotional wellbeing is indicated by absence of clinically significant psychosocial distress or social isolation.       Core Components/Risk Factors/Patient Goals Review:  Goals and Risk Factor Review    Row Name 11/16/17 1638             Core Components/Risk Factors/Patient Goals Review   Personal Goals Review  Hypertension;Stress;Lipids       Review  Pt with multiple CAD RF.  Pt is  eager to participate in CR exercises.        Expected Outcomes  Pt will participate in CR exercises and educational opportunities including nutrition, lifestyle modifications, and stress education.           Core Components/Risk Factors/Patient Goals at Discharge (Final Review):  Goals and Risk Factor Review - 11/16/17 1638      Core Components/Risk Factors/Patient Goals Review   Personal Goals Review  Hypertension;Stress;Lipids    Review  Pt with multiple CAD RF.  Pt is eager to participate in CR exercises.     Expected Outcomes  Pt will participate in CR exercises and educational opportunities including nutrition, lifestyle modifications, and stress education.        ITP Comments: ITP Comments    Row Name 11/10/17 1416 11/17/17 1220         ITP Comments  Medical Director:  Dr. Armanda Magic  30 Day ITP review. Patient off to a good start to exercise after completing her first day of exercise.         Comments: .See ITP Comments.Gladstone Lighter, RN,BSN 11/19/2017 11:37 AM

## 2017-11-18 ENCOUNTER — Ambulatory Visit (HOSPITAL_COMMUNITY): Payer: Medicare Other

## 2017-11-18 ENCOUNTER — Encounter (HOSPITAL_COMMUNITY)
Admission: RE | Admit: 2017-11-18 | Discharge: 2017-11-18 | Disposition: A | Discharge status: Home / Self Care | Payer: Medicare Other | Source: Ambulatory Visit | Attending: Cardiology | Admitting: Cardiology

## 2017-11-18 DIAGNOSIS — Z9861 Coronary angioplasty status: Secondary | ICD-10-CM

## 2017-11-18 DIAGNOSIS — Z48812 Encounter for surgical aftercare following surgery on the circulatory system: Secondary | ICD-10-CM | POA: Diagnosis not present

## 2017-11-20 ENCOUNTER — Ambulatory Visit (HOSPITAL_COMMUNITY): Payer: Medicare Other

## 2017-11-20 ENCOUNTER — Encounter (HOSPITAL_COMMUNITY)
Admission: RE | Admit: 2017-11-20 | Discharge: 2017-11-20 | Disposition: A | Discharge status: Home / Self Care | Payer: Medicare Other | Source: Ambulatory Visit | Attending: Cardiology | Admitting: Cardiology

## 2017-11-20 DIAGNOSIS — Z9861 Coronary angioplasty status: Secondary | ICD-10-CM

## 2017-11-20 DIAGNOSIS — Z48812 Encounter for surgical aftercare following surgery on the circulatory system: Secondary | ICD-10-CM | POA: Diagnosis not present

## 2017-11-23 ENCOUNTER — Encounter (HOSPITAL_COMMUNITY)
Admission: RE | Admit: 2017-11-23 | Discharge: 2017-11-23 | Disposition: A | Discharge status: Home / Self Care | Payer: Medicare Other | Source: Ambulatory Visit | Attending: Cardiology | Admitting: Cardiology

## 2017-11-23 ENCOUNTER — Ambulatory Visit (HOSPITAL_COMMUNITY): Payer: Medicare Other

## 2017-11-23 DIAGNOSIS — Z48812 Encounter for surgical aftercare following surgery on the circulatory system: Secondary | ICD-10-CM | POA: Diagnosis not present

## 2017-11-23 DIAGNOSIS — Z9861 Coronary angioplasty status: Secondary | ICD-10-CM

## 2017-11-25 ENCOUNTER — Encounter (HOSPITAL_COMMUNITY)
Admission: RE | Admit: 2017-11-25 | Discharge: 2017-11-25 | Disposition: A | Discharge status: Home / Self Care | Payer: Medicare Other | Source: Ambulatory Visit | Attending: Cardiology | Admitting: Cardiology

## 2017-11-25 ENCOUNTER — Ambulatory Visit (HOSPITAL_COMMUNITY): Payer: Medicare Other

## 2017-11-25 DIAGNOSIS — Z48812 Encounter for surgical aftercare following surgery on the circulatory system: Secondary | ICD-10-CM | POA: Diagnosis not present

## 2017-11-27 ENCOUNTER — Encounter (HOSPITAL_COMMUNITY)
Admission: RE | Admit: 2017-11-27 | Discharge: 2017-11-27 | Disposition: A | Discharge status: Home / Self Care | Payer: Medicare Other | Source: Ambulatory Visit | Attending: Cardiology | Admitting: Cardiology

## 2017-11-27 ENCOUNTER — Telehealth (HOSPITAL_COMMUNITY): Payer: Self-pay | Admitting: *Deleted

## 2017-11-27 ENCOUNTER — Ambulatory Visit (HOSPITAL_COMMUNITY): Payer: Medicare Other

## 2017-11-27 DIAGNOSIS — Z48812 Encounter for surgical aftercare following surgery on the circulatory system: Secondary | ICD-10-CM | POA: Diagnosis not present

## 2017-11-27 DIAGNOSIS — Z9861 Coronary angioplasty status: Secondary | ICD-10-CM

## 2017-11-27 NOTE — Telephone Encounter (Signed)
Dr. York Spanielilley's office called regarding pt's self medication changes.  Talked with Dr. York Spanielilley's nurse.  After cardiac rehab on 3/27, the pt decided to not take her evening dose 6.25 mg of carvedilol d/t "feeling week."  The pt did not take her 3/28 morning dose of Carvedilol as well.  Her 3/28 PM dose and 3/29 AM dose she only took a 3.125 mg (0.5 tab) dose.  No complaints during exercise session today. VSS.  Will continue to monitor the patient throughout  the program.

## 2017-11-30 ENCOUNTER — Ambulatory Visit (HOSPITAL_COMMUNITY): Payer: Medicare Other

## 2017-11-30 ENCOUNTER — Encounter (HOSPITAL_COMMUNITY)
Admission: RE | Admit: 2017-11-30 | Discharge: 2017-11-30 | Disposition: A | Discharge status: Home / Self Care | Payer: Medicare Other | Source: Ambulatory Visit | Attending: Cardiology | Admitting: Cardiology

## 2017-11-30 DIAGNOSIS — Z48812 Encounter for surgical aftercare following surgery on the circulatory system: Secondary | ICD-10-CM | POA: Insufficient documentation

## 2017-11-30 DIAGNOSIS — Z9861 Coronary angioplasty status: Secondary | ICD-10-CM | POA: Diagnosis present

## 2017-12-02 ENCOUNTER — Encounter (HOSPITAL_COMMUNITY): Payer: Medicare Other

## 2017-12-02 ENCOUNTER — Ambulatory Visit (HOSPITAL_COMMUNITY): Payer: Medicare Other

## 2017-12-04 ENCOUNTER — Ambulatory Visit (HOSPITAL_COMMUNITY): Payer: Medicare Other

## 2017-12-04 ENCOUNTER — Encounter (HOSPITAL_COMMUNITY): Payer: Medicare Other

## 2017-12-04 ENCOUNTER — Telehealth (HOSPITAL_COMMUNITY): Payer: Self-pay

## 2017-12-04 NOTE — Telephone Encounter (Signed)
Patient called and stated she was running late and by the time she got here class would be over. She stated her body is hurting all over due to the weather. Patient will not be attending class today.

## 2017-12-07 ENCOUNTER — Encounter (HOSPITAL_COMMUNITY)
Admission: RE | Admit: 2017-12-07 | Discharge: 2017-12-07 | Disposition: A | Discharge status: Home / Self Care | Payer: Medicare Other | Source: Ambulatory Visit | Attending: Cardiology | Admitting: Cardiology

## 2017-12-07 ENCOUNTER — Ambulatory Visit (HOSPITAL_COMMUNITY): Payer: Medicare Other

## 2017-12-07 DIAGNOSIS — Z48812 Encounter for surgical aftercare following surgery on the circulatory system: Secondary | ICD-10-CM | POA: Diagnosis not present

## 2017-12-07 DIAGNOSIS — Z9861 Coronary angioplasty status: Secondary | ICD-10-CM

## 2017-12-07 NOTE — Progress Notes (Signed)
I have reviewed a Home Exercise Prescription with Deborah JesterMary K Lewis [name] . Deborah [name] is currently exercising at home.  The patient was advised to continue to walk 2-3 days a week for 15-30 minutes.  [name] and I discussed how to progress their exercise prescription. The patient stated that their goal was to build stamina to be able to attend [employer] classes again. The patient stated that they understand the exercise prescription. We reviewed exercise guidelines, target heart rate during exercise, weather, endpoints for exercise, and goals. Patient is encouraged to come to me with any questions. I will continue to follow up with the patient to assist them with progression and safety.    York Ceriseyara R Swan [place] MS, ACSM CEP 12/07/2017 2:02 PM

## 2017-12-09 ENCOUNTER — Encounter (HOSPITAL_COMMUNITY)
Admission: RE | Admit: 2017-12-09 | Discharge: 2017-12-09 | Disposition: A | Discharge status: Home / Self Care | Payer: Medicare Other | Source: Ambulatory Visit | Attending: Cardiology | Admitting: Cardiology

## 2017-12-09 ENCOUNTER — Ambulatory Visit (HOSPITAL_COMMUNITY): Payer: Medicare Other

## 2017-12-09 DIAGNOSIS — Z48812 Encounter for surgical aftercare following surgery on the circulatory system: Secondary | ICD-10-CM | POA: Diagnosis not present

## 2017-12-09 DIAGNOSIS — Z9861 Coronary angioplasty status: Secondary | ICD-10-CM

## 2017-12-11 ENCOUNTER — Ambulatory Visit (HOSPITAL_COMMUNITY): Payer: Medicare Other

## 2017-12-11 ENCOUNTER — Encounter (HOSPITAL_COMMUNITY): Payer: Medicare Other

## 2017-12-14 ENCOUNTER — Encounter (HOSPITAL_COMMUNITY): Payer: Medicare Other

## 2017-12-14 ENCOUNTER — Ambulatory Visit (HOSPITAL_COMMUNITY): Payer: Medicare Other

## 2017-12-14 ENCOUNTER — Telehealth (HOSPITAL_COMMUNITY): Payer: Self-pay | Admitting: Internal Medicine

## 2017-12-16 ENCOUNTER — Ambulatory Visit (HOSPITAL_COMMUNITY): Payer: Medicare Other

## 2017-12-16 ENCOUNTER — Encounter (HOSPITAL_COMMUNITY): Payer: Self-pay

## 2017-12-16 ENCOUNTER — Encounter (HOSPITAL_COMMUNITY)
Admission: RE | Admit: 2017-12-16 | Discharge: 2017-12-16 | Disposition: A | Discharge status: Home / Self Care | Payer: Medicare Other | Source: Ambulatory Visit | Attending: Cardiology | Admitting: Cardiology

## 2017-12-16 DIAGNOSIS — Z9861 Coronary angioplasty status: Secondary | ICD-10-CM

## 2017-12-16 DIAGNOSIS — Z48812 Encounter for surgical aftercare following surgery on the circulatory system: Secondary | ICD-10-CM | POA: Diagnosis not present

## 2017-12-16 NOTE — Progress Notes (Signed)
Deborah JesterMary K Lewis [name] 67 y.o. 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 Nutrition plan and survey reviewed with pt. Pt is following Step 1 of the Therapeutic Lifestyle Changes diet. Pt wants to lose 5 lbs. Wt loss tips reviewed.  Pt expressed understanding of the information reviewed. Pt aware of nutrition education classes offered.  Nutrition Diagnosis ? Food-and nutrition-related knowledge deficit related to lack of exposure to information as related to diagnosis of: ? CVD   Nutrition Intervention ? Pt's individual nutrition plan reviewed with pt. ? Benefits of adopting Heart Healthy diet discussed when [employer] reviewed.    Nutrition Goal(s):  ? Pt to identify and limit food sources of saturated fat, trans fat, and sodium ? Pt to identify food quantities necessary to achieve weight loss of 5 lb at graduation from cardiac rehab. Goal wt of 138 lb desired.   Plan:  Pt to attend nutrition classes ? Nutrition I ? Nutrition II ? Portion Distortion  Will provide client-centered nutrition education as part of interdisciplinary care.   Monitor and evaluate progress toward nutrition goal with team.  Deborah PlumbEdna Praneel [name], M.Ed, RD, LDN, CDE 12/16/2017 12:14 PM

## 2017-12-17 NOTE — Progress Notes (Signed)
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 signs, symptoms, and treatment of hypoglycemia and hyperglycemia, and foot care basics.   Diabetes Blitz:  -Group instruction provided by PowerPoint slides, verbal discussion, and written materials to support subject matter. The instructor gives an explanation and review of the physiology behind type 1 and type 2 diabetes, diabetes medications and rational behind using different medications, pre- and post-prandial blood glucose recommendations and Hemoglobin A1c goals, diabetes diet, and exercise including blood glucose guidelines for exercising safely.    Portion Distortion:  -  Group instruction provided by PowerPoint slides, verbal discussion, written materials, and food models to support subject matter. The instructor gives an explanation of serving  size versus portion size, changes in portions sizes over the last 20 years, and what consists of a serving from each food group.   Stress Management:  -Group instruction provided by verbal instruction, video, and written materials to support subject matter.  Instructors review role of stress in heart disease and how to cope with stress positively.     Exercising on Your Own:  -Group instruction provided by verbal instruction, power point, and written materials to support subject.  Instructors discuss benefits of exercise, components of exercise, frequency and intensity of exercise, and end points for exercise.  Also discuss use of nitroglycerin and activating EMS.  Review options of places to exercise outside of rehab.  Review guidelines for sex with heart disease.   Cardiac Drugs I:  -Group instruction provided by verbal instruction and written materials to support subject.  Instructor reviews cardiac drug classes: antiplatelets, anticoagulants, beta blockers, and statins.  Instructor discusses reasons, side effects, and lifestyle considerations for each drug class.   Cardiac Drugs II:  -Group instruction provided by verbal instruction and written materials to support subject.  Instructor reviews cardiac drug classes: angiotensin converting enzyme inhibitors (ACE-I), angiotensin II receptor blockers (ARBs), nitrates, and calcium channel blockers.  Instructor discusses reasons, side effects, and lifestyle considerations for each drug class.   Anatomy and Physiology of the Circulatory System:  Group verbal and written instruction and models provide basic cardiac anatomy and physiology, with the coronary electrical and arterial systems. Review of: AMI, Angina, Valve disease, Heart Failure, Peripheral Artery Disease, Cardiac Arrhythmia, Pacemakers, and the ICD.   Other Education:  -Group or individual verbal, written, or video instructions that support the educational goals of the cardiac rehab  program.   Holiday Eating Survival Tips:  -Group instruction provided by PowerPoint slides, verbal discussion, and written materials to support subject matter. The instructor gives patients tips, tricks, and techniques to help them not only survive but enjoy the holidays despite the onslaught of food that accompanies the holidays.   Knowledge Questionnaire Score: Knowledge Questionnaire Score - 11/10/17 1417      Knowledge Questionnaire Score   Pre Score  18/24        Core Components/Risk Factors/Patient Goals at Admission: Personal Goals and Risk Factors at Admission - 11/10/17 1522      Core Components/Risk Factors/Patient Goals on Admission   Admit Weight  144 lb 2.9 oz (65.4 kg)    Goal Weight: Short Term  140 lb (63.5 kg)    Goal Weight: Long Term  136 lb (61.7 kg)    Expected Outcomes  Short Term: Continue to assess and modify interventions until short term weight is achieved;Long Term: Adherence to nutrition and physical activity/exercise program aimed toward attainment of established weight goal;Weight Loss: Understanding of general recommendations for a balanced deficit meal plan, which promotes 1-2 lb weight loss per week and includes a negative energy balance of [phone] kcal/d;Understanding recommendations for meals to include 15-35% energy as protein, 25-35% energy from fat, 35-60% energy from carbohydrates, less than 200mg  of dietary cholesterol, 20-35 gm of total fiber daily    Hypertension  Yes    Intervention  Provide education on lifestyle modifcations including regular physical activity/exercise, weight management, moderate sodium restriction and increased consumption of fresh fruit, vegetables, and low fat dairy, alcohol moderation, and smoking cessation.;Monitor prescription use compliance.  Expected Outcomes  Short Term: Continued assessment and intervention until BP is < 140/90mm HG in hypertensive participants. < 130/7mm HG in hypertensive participants with  diabetes, heart failure or chronic kidney disease.;Long Term: Maintenance of blood pressure at goal levels.    Lipids  Yes    Intervention  Provide education and support for participant on nutrition & aerobic/resistive exercise along with prescribed medications to achieve LDL 70mg , HDL >40mg .    Expected Outcomes  Short Term: Participant states understanding of desired cholesterol values and is compliant with medications prescribed. Participant is following exercise prescription and nutrition guidelines.;Long Term: Cholesterol controlled with medications as prescribed, with individualized exercise RX and with personalized nutrition plan. Value goals: LDL < 70mg , HDL > 40 mg.    Stress  Yes    Intervention  Offer individual and/or small group education and counseling on adjustment to heart disease, stress management and health-related lifestyle change. Teach and support self-help strategies.;Refer participants experiencing significant psychosocial distress to appropriate mental health specialists for further evaluation and treatment. When possible, include family members and significant others in education/counseling sessions.    Expected Outcomes  Short Term: Participant demonstrates changes in health-related behavior, relaxation and other stress management skills, ability to obtain effective social support, and compliance with psychotropic medications if prescribed.;Long Term: Emotional wellbeing is indicated by absence of clinically significant psychosocial distress or social isolation.       Core Components/Risk Factors/Patient Goals Review:  Goals and Risk Factor Review    Row Name 11/16/17 1638 12/16/17 1717           Core Components/Risk Factors/Patient Goals Review   Personal Goals Review  Hypertension;Stress;Lipids  Hypertension;Stress;Lipids      Review  Pt with multiple CAD RF.  Pt is eager to participate in CR exercises.   Pt with multiple CAD RF.  Pt is eager to participate in CR  exercises. Pt is going to attempt to have a more regular attendance.       Expected Outcomes  Pt will participate in CR exercises and educational opportunities including nutrition, lifestyle modifications, and stress education.   Pt will participate in CR exercises and educational opportunities including nutrition, lifestyle modifications, and stress education.          Core Components/Risk Factors/Patient Goals at Discharge (Final Review):  Goals and Risk Factor Review - 12/16/17 1717      Core Components/Risk Factors/Patient Goals Review   Personal Goals Review  Hypertension;Stress;Lipids    Review  Pt with multiple CAD RF.  Pt is eager to participate in CR exercises. Pt is going to attempt to have a more regular attendance.     Expected Outcomes  Pt will participate in CR exercises and educational opportunities including nutrition, lifestyle modifications, and stress education.        ITP Comments: ITP Comments    Row Name 11/10/17 1416 11/17/17 1220 12/16/17 1716       ITP Comments  Medical Director:  Dr. Armanda Magic  30 Day ITP review. Patient off to a good start to exercise after completing her first day of exercise.  30 Day ITP review. Pt willing to attend CR Program despite inconsistent attendance.  Scattered absences d/t illness, chronic back pain, and time management.  Pt is frequently late when attending cardiac rehab.         Comments: See ITP comments.

## 2017-12-18 ENCOUNTER — Ambulatory Visit (HOSPITAL_COMMUNITY): Payer: Medicare Other

## 2017-12-18 ENCOUNTER — Encounter (HOSPITAL_COMMUNITY)
Admission: RE | Admit: 2017-12-18 | Discharge: 2017-12-18 | Disposition: A | Discharge status: Home / Self Care | Payer: Medicare Other | Source: Ambulatory Visit | Attending: Cardiology | Admitting: Cardiology

## 2017-12-18 DIAGNOSIS — Z9861 Coronary angioplasty status: Secondary | ICD-10-CM

## 2017-12-18 DIAGNOSIS — Z48812 Encounter for surgical aftercare following surgery on the circulatory system: Secondary | ICD-10-CM | POA: Diagnosis not present

## 2017-12-21 ENCOUNTER — Encounter (HOSPITAL_COMMUNITY)
Admission: RE | Admit: 2017-12-21 | Discharge: 2017-12-21 | Disposition: A | Discharge status: Home / Self Care | Payer: Medicare Other | Source: Ambulatory Visit | Attending: Cardiology | Admitting: Cardiology

## 2017-12-21 ENCOUNTER — Ambulatory Visit (HOSPITAL_COMMUNITY): Payer: Medicare Other

## 2017-12-21 DIAGNOSIS — Z48812 Encounter for surgical aftercare following surgery on the circulatory system: Secondary | ICD-10-CM | POA: Diagnosis not present

## 2017-12-21 DIAGNOSIS — Z9861 Coronary angioplasty status: Secondary | ICD-10-CM

## 2017-12-23 ENCOUNTER — Ambulatory Visit (HOSPITAL_COMMUNITY): Payer: Medicare Other

## 2017-12-23 ENCOUNTER — Encounter (HOSPITAL_COMMUNITY)
Admission: RE | Admit: 2017-12-23 | Discharge: 2017-12-23 | Disposition: A | Discharge status: Home / Self Care | Payer: Medicare Other | Source: Ambulatory Visit | Attending: Cardiology | Admitting: Cardiology

## 2017-12-23 DIAGNOSIS — Z9861 Coronary angioplasty status: Secondary | ICD-10-CM

## 2017-12-23 DIAGNOSIS — Z48812 Encounter for surgical aftercare following surgery on the circulatory system: Secondary | ICD-10-CM | POA: Diagnosis not present

## 2017-12-25 ENCOUNTER — Ambulatory Visit (HOSPITAL_COMMUNITY): Payer: Medicare Other

## 2017-12-25 ENCOUNTER — Encounter (HOSPITAL_COMMUNITY)
Admission: RE | Admit: 2017-12-25 | Discharge: 2017-12-25 | Disposition: A | Discharge status: Home / Self Care | Payer: Medicare Other | Source: Ambulatory Visit | Attending: Cardiology | Admitting: Cardiology

## 2017-12-25 DIAGNOSIS — Z9861 Coronary angioplasty status: Secondary | ICD-10-CM

## 2017-12-25 DIAGNOSIS — Z48812 Encounter for surgical aftercare following surgery on the circulatory system: Secondary | ICD-10-CM | POA: Diagnosis not present

## 2017-12-28 ENCOUNTER — Encounter (HOSPITAL_COMMUNITY)
Admission: RE | Admit: 2017-12-28 | Discharge: 2017-12-28 | Disposition: A | Discharge status: Home / Self Care | Payer: Medicare Other | Source: Ambulatory Visit | Attending: Cardiology | Admitting: Cardiology

## 2017-12-28 ENCOUNTER — Encounter (HOSPITAL_COMMUNITY): Payer: Medicare Other

## 2017-12-28 DIAGNOSIS — Z48812 Encounter for surgical aftercare following surgery on the circulatory system: Secondary | ICD-10-CM | POA: Diagnosis not present

## 2017-12-28 DIAGNOSIS — Z9861 Coronary angioplasty status: Secondary | ICD-10-CM

## 2017-12-30 ENCOUNTER — Encounter (HOSPITAL_COMMUNITY)
Admission: RE | Admit: 2017-12-30 | Discharge: 2017-12-30 | Disposition: A | Discharge status: Home / Self Care | Payer: Medicare Other | Source: Ambulatory Visit | Attending: Cardiology | Admitting: Cardiology

## 2017-12-30 DIAGNOSIS — Z9861 Coronary angioplasty status: Secondary | ICD-10-CM

## 2017-12-30 DIAGNOSIS — Z48812 Encounter for surgical aftercare following surgery on the circulatory system: Secondary | ICD-10-CM | POA: Diagnosis not present

## 2018-01-01 ENCOUNTER — Encounter (HOSPITAL_COMMUNITY): Payer: Medicare Other

## 2018-01-04 ENCOUNTER — Encounter (HOSPITAL_COMMUNITY)
Admission: RE | Admit: 2018-01-04 | Discharge: 2018-01-04 | Disposition: A | Discharge status: Home / Self Care | Payer: Medicare Other | Source: Ambulatory Visit | Attending: Cardiology | Admitting: Cardiology

## 2018-01-04 DIAGNOSIS — Z48812 Encounter for surgical aftercare following surgery on the circulatory system: Secondary | ICD-10-CM | POA: Diagnosis not present

## 2018-01-04 DIAGNOSIS — Z9861 Coronary angioplasty status: Secondary | ICD-10-CM

## 2018-01-06 ENCOUNTER — Encounter (HOSPITAL_COMMUNITY): Payer: Medicare Other

## 2018-01-08 ENCOUNTER — Encounter (HOSPITAL_COMMUNITY): Payer: Medicare Other

## 2018-01-11 ENCOUNTER — Encounter (HOSPITAL_COMMUNITY)
Admission: RE | Admit: 2018-01-11 | Discharge: 2018-01-11 | Disposition: A | Discharge status: Home / Self Care | Payer: Medicare Other | Source: Ambulatory Visit | Attending: Cardiology | Admitting: Cardiology

## 2018-01-11 DIAGNOSIS — Z9861 Coronary angioplasty status: Secondary | ICD-10-CM

## 2018-01-11 DIAGNOSIS — Z48812 Encounter for surgical aftercare following surgery on the circulatory system: Secondary | ICD-10-CM | POA: Diagnosis not present

## 2018-01-13 ENCOUNTER — Encounter (HOSPITAL_COMMUNITY)
Admission: RE | Admit: 2018-01-13 | Discharge: 2018-01-13 | Disposition: A | Discharge status: Home / Self Care | Payer: Medicare Other | Source: Ambulatory Visit | Attending: Cardiology | Admitting: Cardiology

## 2018-01-13 DIAGNOSIS — Z48812 Encounter for surgical aftercare following surgery on the circulatory system: Secondary | ICD-10-CM | POA: Diagnosis not present

## 2018-01-13 DIAGNOSIS — Z9861 Coronary angioplasty status: Secondary | ICD-10-CM

## 2018-01-14 NOTE — Progress Notes (Signed)
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  EP  Instruction Review Code  2- Demonstrated Understanding      Meditation and Mindfulness:  -Group instruction provided by verbal instruction, patient participation, and written materials to support subject.  Instructor addresses importance of mindfulness and meditation practice to help reduce stress and improve awareness.  Instructor also leads participants through a meditation exercise.    CARDIAC REHAB PHASE II EXERCISE from 01/13/2018 in [facility] CARDIAC REHAB  Date  01/13/18  Educator  Theda Belfast  Instruction Review Code  2- Demonstrated Understanding      Stretching for Flexibility and Mobility:  -Group instruction provided by verbal instruction, patient participation, and written materials to support subject.  Instructors lead  participants through series of stretches that are designed to increase flexibility thus improving mobility.  These stretches are additional exercise for major muscle groups that are typically performed during regular warm up and cool down.   Hands Only CPR:  -Group verbal, video, and participation provides a basic overview of AHA guidelines for community CPR. Role-play of emergencies allow participants the opportunity to practice calling for help and chest compression technique with discussion of AED use.   Hypertension: -Group verbal and written instruction that provides a basic overview of hypertension including the most recent diagnostic guidelines, risk factor reduction with self-care instructions and medication management.    Nutrition I class: Heart Healthy Eating:  -Group instruction provided by PowerPoint slides, verbal discussion, and written materials to support subject matter. The instructor gives an explanation and review of the Therapeutic Lifestyle Changes diet recommendations, which includes a discussion on lipid goals, dietary fat, sodium, fiber, plant stanol/sterol esters, sugar, and the components of a well-balanced, healthy diet.   Nutrition II class: Lifestyle Skills:  -Group instruction provided by PowerPoint slides, verbal discussion, and written materials to support subject matter. The instructor gives an explanation and review of label reading, grocery shopping for heart health, heart healthy recipe modifications, and ways to make healthier choices when eating out.   Diabetes Question & Answer:  -Group instruction provided by PowerPoint slides, verbal discussion, and written materials to support subject matter. The instructor gives an explanation and review of diabetes co-morbidities, pre- and post-prandial blood glucose goals, pre-exercise blood glucose goals, signs, symptoms, and treatment of hypoglycemia and hyperglycemia, and foot care basics.   Diabetes Blitz:   -Group instruction provided by PowerPoint slides, verbal discussion, and written materials to support subject matter. The instructor gives an explanation and review of the physiology behind type 1 and type 2 diabetes, diabetes medications and rational behind using different medications, pre- and post-prandial blood glucose recommendations and Hemoglobin A1c goals, diabetes diet, and exercise including blood glucose guidelines for exercising safely.    Portion Distortion:  -Group instruction provided by PowerPoint slides, verbal discussion, written materials, and food models to support subject matter. The instructor gives an explanation of serving size versus portion size, changes in portions sizes over the last 20 years, and what consists of a serving from each food group.   Stress Management:  -Group instruction provided by verbal instruction, video, and written materials to support subject matter.  Instructors review role of stress in heart disease and how to cope with stress positively.     Exercising on Your Own:  -Group instruction provided by verbal instruction, power point, and written materials to support subject.  Instructors discuss benefits of exercise, components of exercise, frequency and intensity of exercise, and end points for exercise.  Also discuss use of nitroglycerin and activating EMS.  Review options of places to exercise outside of rehab.  Review guidelines for sex with heart disease.   Cardiac Drugs I:  -Group instruction provided by verbal instruction and written materials to support subject.  Instructor reviews cardiac drug classes: antiplatelets, anticoagulants, beta blockers, and statins.  Instructor discusses reasons, side effects, and lifestyle considerations for each drug class.   Cardiac Drugs II:  -Group instruction provided by verbal instruction and written materials to support subject.  Instructor reviews cardiac drug classes: angiotensin converting enzyme  inhibitors (ACE-I), angiotensin II receptor blockers (ARBs), nitrates, and calcium channel blockers.  Instructor discusses reasons, side effects, and lifestyle considerations for each drug class.   Anatomy and Physiology of the Circulatory System:  Group verbal and written instruction and models provide basic cardiac anatomy and physiology, with the coronary electrical and arterial systems. Review of: AMI, Angina, Valve disease, Heart Failure, Peripheral Artery Disease, Cardiac Arrhythmia, Pacemakers, and the ICD.   Other Education:  -Group or individual verbal, written, or video instructions that support the educational goals of the cardiac rehab program.   Holiday Eating Survival Tips:  -Group instruction provided by PowerPoint slides, verbal discussion, and written materials to support subject matter. The instructor gives patients tips, tricks, and techniques to help them not only survive but enjoy the holidays despite the onslaught of food that accompanies the holidays.   Knowledge Questionnaire Score: Knowledge Questionnaire Score - 11/10/17 1417      Knowledge Questionnaire Score   Pre Score  18/24        Core Components/Risk Factors/Patient Goals at Admission: Personal Goals and Risk Factors at Admission - 11/10/17 1522      Core Components/Risk Factors/Patient Goals on Admission   Admit Weight  144 lb 2.9 oz (65.4 kg)    Goal Weight: Short Term  140 lb (63.5 kg)    Goal Weight: Long Term  136 lb (61.7 kg)    Expected Outcomes  Short Term: Continue to assess and modify interventions until short term weight is achieved;Long Term: Adherence to nutrition and physical activity/exercise program aimed toward attainment of established weight goal;Weight Loss: Understanding of general recommendations for a balanced deficit meal plan, which promotes 1-2 lb weight loss per week and includes a negative energy balance of [phone] kcal/d;Understanding recommendations for meals to include  15-35% energy as protein, 25-35% energy from fat, 35-60% energy from carbohydrates, less than 200mg  of dietary cholesterol, 20-35 gm of total fiber daily    Hypertension  Yes    Intervention  Provide education on lifestyle modifcations including regular physical activity/exercise, weight management, moderate sodium restriction and increased consumption of fresh fruit, vegetables, and low fat dairy, alcohol moderation, and smoking cessation.;Monitor prescription use compliance.    Expected Outcomes  Short Term: Continued assessment and intervention until BP is < 140/66mm HG in hypertensive participants. < 130/33mm HG in hypertensive participants with diabetes, heart failure or chronic kidney disease.;Long Term: Maintenance of blood pressure at goal levels.    Lipids  Yes    Intervention  Provide education and support for participant on nutrition & aerobic/resistive exercise along with prescribed medications to achieve LDL 70mg , HDL >40mg .    Expected Outcomes  Short Term: Participant states understanding of desired cholesterol values and is compliant with medications prescribed. Participant is following exercise prescription and nutrition guidelines.;Long Term: Cholesterol controlled with medications as prescribed, with individualized exercise RX and with personalized nutrition plan. Value goals: LDL < 70mg , HDL > 40  mg.    Stress  Yes    Intervention  Offer individual and/or small group education and counseling on adjustment to heart disease, stress management and health-related lifestyle change. Teach and support self-help strategies.;Refer participants experiencing significant psychosocial distress to appropriate mental health specialists for further evaluation and treatment. When possible, include family members and significant others in education/counseling sessions.    Expected Outcomes  Short Term: Participant demonstrates changes in health-related behavior, relaxation and other stress management  skills, ability to obtain effective social support, and compliance with psychotropic medications if prescribed.;Long Term: Emotional wellbeing is indicated by absence of clinically significant psychosocial distress or social isolation.       Core Components/Risk Factors/Patient Goals Review:  Goals and Risk Factor Review    Row Name 11/16/17 1638 12/16/17 1717 01/14/18 1230         Core Components/Risk Factors/Patient Goals Review   Personal Goals Review  Hypertension;Stress;Lipids  Hypertension;Stress;Lipids  Hypertension;Stress;Lipids     Review  Pt with multiple CAD RF.  Pt is eager to participate in CR exercises.   Pt with multiple CAD RF.  Pt is eager to participate in CR exercises. Pt is going to attempt to have a more regular attendance.   Pt with multiple CAD RF.  Pt is eager to participate in CR exercises. Deborah [name] still struggles with attendance.  She has attended a later session a couple of times though at this time she does not want to switch permanently.  Deborah [name] does report increasing her walking at home due to her participation in exercise.     Expected Outcomes  Pt will participate in CR exercises and educational opportunities including nutrition, lifestyle modifications, and stress education.   Pt will participate in CR exercises and educational opportunities including nutrition, lifestyle modifications, and stress education.   Pt will participate in CR exercises and educational opportunities including nutrition, lifestyle modifications, and stress education.         Core Components/Risk Factors/Patient Goals at Discharge (Final Review):  Goals and Risk Factor Review - 01/14/18 1230      Core Components/Risk Factors/Patient Goals Review   Personal Goals Review  Hypertension;Stress;Lipids    Review  Pt with multiple CAD RF.  Pt is eager to participate in CR exercises. Deborah [name] still struggles with attendance.  She has attended a later session a couple of times though at this time she  does not want to switch permanently.  Deborah [name] does report increasing her walking at home due to her participation in exercise.    Expected Outcomes  Pt will participate in CR exercises and educational opportunities including nutrition, lifestyle modifications, and stress education.        ITP Comments: ITP Comments    Row Name 11/10/17 1416 11/17/17 1220 12/16/17 1716 01/14/18 1446     ITP Comments  Medical Director:  Dr. Armanda Magic  30 Day ITP review. Patient off to a good start to exercise after completing her first day of exercise.  30 Day ITP review. Pt willing to attend CR Program despite inconsistent attendance.  Scattered absences d/t illness, chronic back pain, and time management.  Pt is frequently late when attending cardiac rehab.   30 Day ITP Review. Pt continues to participate in exercise.  She has increased her walking at home and enjoys the socialization that she gets from the CR Program.       Comments: See ITP Comments.

## 2018-01-15 ENCOUNTER — Encounter (HOSPITAL_COMMUNITY): Admission: RE | Admit: 2018-01-15 | Payer: Medicare Other | Source: Ambulatory Visit

## 2018-01-18 ENCOUNTER — Telehealth (HOSPITAL_COMMUNITY): Payer: Self-pay | Admitting: *Deleted

## 2018-01-18 ENCOUNTER — Encounter (HOSPITAL_COMMUNITY)
Admission: RE | Admit: 2018-01-18 | Discharge: 2018-01-18 | Disposition: A | Discharge status: Home / Self Care | Payer: Medicare Other | Source: Ambulatory Visit | Attending: Cardiology | Admitting: Cardiology

## 2018-01-18 DIAGNOSIS — Z9861 Coronary angioplasty status: Secondary | ICD-10-CM

## 2018-01-18 DIAGNOSIS — Z48812 Encounter for surgical aftercare following surgery on the circulatory system: Secondary | ICD-10-CM | POA: Diagnosis not present

## 2018-01-20 ENCOUNTER — Encounter (HOSPITAL_COMMUNITY)
Admission: RE | Admit: 2018-01-20 | Discharge: 2018-01-20 | Disposition: A | Discharge status: Home / Self Care | Payer: Medicare Other | Source: Ambulatory Visit | Attending: Cardiology | Admitting: Cardiology

## 2018-01-20 DIAGNOSIS — Z9861 Coronary angioplasty status: Secondary | ICD-10-CM

## 2018-01-20 DIAGNOSIS — Z48812 Encounter for surgical aftercare following surgery on the circulatory system: Secondary | ICD-10-CM | POA: Diagnosis not present

## 2018-01-22 ENCOUNTER — Encounter (HOSPITAL_COMMUNITY): Payer: Medicare Other

## 2018-01-27 ENCOUNTER — Encounter (HOSPITAL_COMMUNITY): Payer: Medicare Other

## 2018-01-29 ENCOUNTER — Encounter (HOSPITAL_COMMUNITY): Payer: Medicare Other

## 2018-02-01 ENCOUNTER — Telehealth (HOSPITAL_COMMUNITY): Payer: Self-pay | Admitting: Internal Medicine

## 2018-02-01 ENCOUNTER — Encounter (HOSPITAL_COMMUNITY): Payer: Medicare Other

## 2018-02-03 ENCOUNTER — Encounter (HOSPITAL_COMMUNITY): Payer: Medicare Other

## 2018-02-05 ENCOUNTER — Encounter (HOSPITAL_COMMUNITY): Payer: Medicare Other

## 2018-02-05 DIAGNOSIS — Z9861 Coronary angioplasty status: Secondary | ICD-10-CM | POA: Insufficient documentation

## 2018-02-05 DIAGNOSIS — Z48812 Encounter for surgical aftercare following surgery on the circulatory system: Secondary | ICD-10-CM | POA: Insufficient documentation

## 2018-02-08 ENCOUNTER — Encounter (HOSPITAL_COMMUNITY)
Admission: RE | Admit: 2018-02-08 | Discharge: 2018-02-08 | Disposition: A | Discharge status: Home / Self Care | Payer: Medicare Other | Source: Ambulatory Visit | Attending: Cardiology | Admitting: Cardiology

## 2018-02-08 DIAGNOSIS — Z48812 Encounter for surgical aftercare following surgery on the circulatory system: Secondary | ICD-10-CM | POA: Diagnosis not present

## 2018-02-08 DIAGNOSIS — Z9861 Coronary angioplasty status: Secondary | ICD-10-CM | POA: Diagnosis present

## 2018-02-10 ENCOUNTER — Encounter (HOSPITAL_COMMUNITY): Payer: Medicare Other

## 2018-02-11 ENCOUNTER — Encounter (HOSPITAL_COMMUNITY): Payer: Self-pay

## 2018-02-11 NOTE — Progress Notes (Signed)
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 vocational rehab assistance to qualifying candidates.   Vocational Rehab Evaluation & Intervention: Vocational Rehab - 11/10/17 1418      Initial Vocational Rehab Evaluation & Intervention   Assessment shows need for Vocational Rehabilitation  No retired        Education: Education Goals: Education classes will be provided on a weekly basis, covering required topics. Participant will state understanding/return demonstration of topics presented.  Learning Barriers/Preferences: Learning Barriers/Preferences - 11/10/17 1459      Learning Barriers/Preferences   Learning Barriers  Hearing;Sight    Learning Preferences  Verbal Instruction;Skilled Demonstration;Individual Instruction;Group Instruction;Written Material       Education Topics: Count Your Pulse:   -Group instruction provided by verbal instruction, demonstration, patient participation and written materials to support subject.  Instructors address importance of being able to find your pulse and how to count your pulse when at home without a heart monitor.  Patients get hands on experience counting their pulse with staff help and individually.   Heart Attack, Angina, and Risk Factor Modification:  -Group instruction provided by verbal instruction, video, and written materials to support subject.  Instructors address signs and symptoms of angina and heart attacks.    Also discuss risk factors for heart disease and how to make changes to improve heart health risk factors.   Functional Fitness:  -Group instruction provided by verbal instruction, demonstration, patient participation, and written materials to support subject.  Instructors address safety measures for doing things around the house.  Discuss how to get up and down off the floor, how to pick things up properly, how to safely get out of a chair without assistance, and balance training.   CARDIAC REHAB PHASE II EXERCISE from 01/13/2018 in [facility] CARDIAC REHAB  Date  12/25/17  Educator  EP  Instruction Review Code  2- Demonstrated Understanding      Meditation and Mindfulness:  -Group instruction provided by verbal instruction, patient participation, and written materials to support subject.  Instructor addresses importance of mindfulness and meditation practice to help reduce stress and improve awareness.  Instructor also leads participants through a meditation exercise.    CARDIAC REHAB PHASE II EXERCISE from 01/13/2018 in [facility] CARDIAC [facility]  Date  01/13/18  Educator  Theda Belfast  Instruction Review Code  2- Demonstrated Understanding      Stretching for Flexibility and Mobility:  -Group instruction provided by verbal instruction, patient participation, and written materials to  support subject.  Instructors lead participants through series of stretches that are designed to increase flexibility thus improving mobility.  These stretches are additional exercise for major muscle groups that are typically performed during regular warm up and cool down.   Hands Only CPR:  -Group verbal, video, and participation provides a basic overview of AHA guidelines for community CPR. Role-play of emergencies allow participants the opportunity to practice calling for help and chest compression technique with discussion of AED use.   Hypertension: -Group verbal and written instruction that provides a basic overview of hypertension including the most recent diagnostic guidelines, risk factor reduction with self-care instructions and medication management.    Nutrition I class: Heart Healthy Eating:  -Group instruction provided by PowerPoint slides, verbal discussion, and written materials to support subject matter. The instructor gives an explanation and review of the Therapeutic Lifestyle Changes diet recommendations, which includes a discussion on lipid goals, dietary fat, sodium, fiber, plant stanol/sterol esters, sugar, and the components of a well-balanced, healthy diet.   Nutrition II class: Lifestyle Skills:  -Group instruction provided by PowerPoint slides, verbal discussion, and written materials to support subject matter. The instructor gives an explanation and review of label reading, grocery shopping for heart health, heart healthy recipe modifications, and ways to make healthier choices when eating out.   Diabetes Question & Answer:  -Group instruction provided by PowerPoint slides, verbal discussion, and written materials to support subject matter. The instructor gives an explanation and review of diabetes co-morbidities, pre- and post-prandial blood glucose goals, pre-exercise blood glucose goals, signs, symptoms, and treatment of hypoglycemia and hyperglycemia, and foot  care basics.   Diabetes Blitz:  -Group instruction provided by PowerPoint slides, verbal discussion, and written materials to support subject matter. The instructor gives an explanation and review of the physiology behind type 1 and type 2 diabetes, diabetes medications and rational behind using different medications, pre- and post-prandial blood glucose recommendations and Hemoglobin A1c goals, diabetes diet, and exercise including blood glucose guidelines for exercising safely.    Portion Distortion:  -Group instruction provided by PowerPoint slides, verbal discussion, written materials, and food models to support subject matter. The instructor gives an explanation of serving size versus portion size, changes in portions sizes over the last 20 years, and what consists of a serving from each food group.   Stress Management:  -Group instruction provided by verbal instruction, video, and written materials to support subject matter.  Instructors review role of stress in heart disease and how to cope with stress positively.     Exercising on Your Own:  -Group instruction provided by verbal instruction, power point, and written materials to support subject.  Instructors discuss benefits of exercise, components of exercise, frequency and intensity of exercise, and end points for exercise.  Also discuss use of nitroglycerin and activating EMS.  Review options of places to exercise outside of rehab.  Review guidelines for sex with heart disease.   Cardiac Drugs I:  -Group instruction provided by verbal instruction and written materials to support subject.  Instructor reviews cardiac drug classes: antiplatelets, anticoagulants, beta blockers, and statins.  Instructor discusses reasons, side effects, and lifestyle considerations for each drug class.   Cardiac Drugs II:  -Group instruction provided by verbal instruction and written  materials to support subject.  Instructor reviews cardiac drug classes:  angiotensin converting enzyme inhibitors (ACE-I), angiotensin II receptor blockers (ARBs), nitrates, and calcium channel blockers.  Instructor discusses reasons, side effects, and lifestyle considerations for each drug class.   Anatomy and Physiology of the Circulatory System:  Group verbal and written instruction and models provide basic cardiac anatomy and physiology, with the coronary electrical and arterial systems. Review of: AMI, Angina, Valve disease, Heart Failure, Peripheral Artery Disease, Cardiac Arrhythmia, Pacemakers, and the ICD.   Other Education:  -Group or individual verbal, written, or video instructions that support the educational goals of the cardiac rehab program.   Holiday Eating Survival Tips:  -Group instruction provided by PowerPoint slides, verbal discussion, and written materials to support subject matter. The instructor gives patients tips, tricks, and techniques to help them not only survive but enjoy the holidays despite the onslaught of food that accompanies the holidays.   Knowledge Questionnaire Score: Knowledge Questionnaire Score - 11/10/17 1417      Knowledge Questionnaire Score   Pre Score  18/24        Core Components/Risk Factors/Patient Goals at Admission: Personal Goals and Risk Factors at Admission - 11/10/17 1522      Core Components/Risk Factors/Patient Goals on Admission   Admit Weight  144 lb 2.9 oz (65.4 kg)    Goal Weight: Short Term  140 lb (63.5 kg)    Goal Weight: Long Term  136 lb (61.7 kg)    Expected Outcomes  Short Term: Continue to assess and modify interventions until short term weight is achieved;Long Term: Adherence to nutrition and physical activity/exercise program aimed toward attainment of established weight goal;Weight Loss: Understanding of general recommendations for a balanced deficit meal plan, which promotes 1-2 lb weight loss per week and includes a negative energy balance of [phone] kcal/d;Understanding  recommendations for meals to include 15-35% energy as protein, 25-35% energy from fat, 35-60% energy from carbohydrates, less than 200mg  of dietary cholesterol, 20-35 gm of total fiber daily    Hypertension  Yes    Intervention  Provide education on lifestyle modifcations including regular physical activity/exercise, weight management, moderate sodium restriction and increased consumption of fresh fruit, vegetables, and low fat dairy, alcohol moderation, and smoking cessation.;Monitor prescription use compliance.    Expected Outcomes  Short Term: Continued assessment and intervention until BP is < 140/42mm HG in hypertensive participants. < 130/2mm HG in hypertensive participants with diabetes, heart failure or chronic kidney disease.;Long Term: Maintenance of blood pressure at goal levels.    Lipids  Yes    Intervention  Provide education and support for participant on nutrition & aerobic/resistive exercise along with prescribed medications to achieve LDL 70mg , HDL >40mg .    Expected Outcomes  Short Term: Participant states understanding of desired cholesterol values and is compliant with medications prescribed. Participant is following exercise prescription and nutrition guidelines.;Long Term: Cholesterol controlled with medications as prescribed, with individualized exercise RX and with personalized nutrition plan. Value goals: LDL < 70mg , HDL > 40 mg.    Stress  Yes    Intervention  Offer individual and/or small group education and counseling on adjustment to heart disease, stress management and health-related lifestyle change. Teach and support self-help strategies.;Refer participants experiencing significant psychosocial distress to appropriate mental health specialists for further evaluation and treatment. When possible, include family members and significant others in education/counseling sessions.    Expected Outcomes  Short Term: Participant demonstrates changes in health-related behavior,  relaxation and other stress management skills,  ability to obtain effective social support, and compliance with psychotropic medications if prescribed.;Long Term: Emotional wellbeing is indicated by absence of clinically significant psychosocial distress or social isolation.       Core Components/Risk Factors/Patient Goals Review:  Goals and Risk Factor Review    Row Name 11/16/17 1638 12/16/17 1717 01/14/18 1230 02/11/18 1119       Core Components/Risk Factors/Patient Goals Review   Personal Goals Review  Hypertension;Stress;Lipids  Hypertension;Stress;Lipids  Hypertension;Stress;Lipids  Hypertension;Stress;Lipids    Review  Pt with multiple CAD RF.  Pt is eager to participate in CR exercises.   Pt with multiple CAD RF.  Pt is eager to participate in CR exercises. Pt is going to attempt to have a more regular attendance.   Pt with multiple CAD RF.  Pt is eager to participate in CR exercises. Florentina Addison still struggles with attendance.  She has attended a later session a couple of times though at this time she does not want to switch permanently.  Florentina Addison does report increasing her walking at home due to her participation in exercise.  Pt with multiple CAD RF.  Florentina Addison still struggles with attendance due to prior obligations or pain.  Encouraged pt to come to remaining sessions or if pain is in issue in the morning to come to later sessions.    Expected Outcomes  Pt will participate in CR exercises and educational opportunities including nutrition, lifestyle modifications, and stress education.   Pt will participate in CR exercises and educational opportunities including nutrition, lifestyle modifications, and stress education.   Pt will participate in CR exercises and educational opportunities including nutrition, lifestyle modifications, and stress education.   Pt will participate in CR exercises and educational opportunities including nutrition, lifestyle modifications, and stress education.        Core  Components/Risk Factors/Patient Goals at Discharge (Final Review):  Goals and Risk Factor Review - 02/11/18 1119      Core Components/Risk Factors/Patient Goals Review   Personal Goals Review  Hypertension;Stress;Lipids    Review  Pt with multiple CAD RF.  Florentina Addison still struggles with attendance due to prior obligations or pain.  Encouraged pt to come to remaining sessions or if pain is in issue in the morning to come to later sessions.    Expected Outcomes  Pt will participate in CR exercises and educational opportunities including nutrition, lifestyle modifications, and stress education.        ITP Comments: ITP Comments    Row Name 11/10/17 1416 11/17/17 1220 12/16/17 1716 01/14/18 1446 02/11/18 1118   ITP Comments  Medical Director:  Dr. Armanda Magic  30 Day ITP review. Patient off to a good start to exercise after completing her first day of exercise.  30 Day ITP review. Pt willing to attend CR Program despite inconsistent attendance.  Scattered absences d/t illness, chronic back pain, and time management.  Pt is frequently late when attending cardiac rehab.   30 Day ITP Review. Pt continues to participate in exercise.  She has increased her walking at home and enjoys the socialization that she gets from the CR Program.  30 Day ITP Review. Pt continues to have poor attendance due to prior obligations or pain. Offered patient option of later class times as her pain is worse in the morning.      Comments: See ITP Comments

## 2018-02-12 ENCOUNTER — Encounter (HOSPITAL_COMMUNITY): Payer: Medicare Other

## 2018-02-15 ENCOUNTER — Encounter (HOSPITAL_COMMUNITY)
Admission: RE | Admit: 2018-02-15 | Discharge: 2018-02-15 | Disposition: A | Discharge status: Home / Self Care | Payer: Medicare Other | Source: Ambulatory Visit | Attending: Cardiology | Admitting: Cardiology

## 2018-02-15 ENCOUNTER — Encounter (HOSPITAL_COMMUNITY): Payer: Medicare Other

## 2018-02-15 VITALS — Ht 62.5 in | Wt 144.2 lb

## 2018-02-15 DIAGNOSIS — Z9861 Coronary angioplasty status: Secondary | ICD-10-CM

## 2018-02-15 DIAGNOSIS — Z48812 Encounter for surgical aftercare following surgery on the circulatory system: Secondary | ICD-10-CM | POA: Diagnosis not present

## 2018-02-17 ENCOUNTER — Encounter (HOSPITAL_COMMUNITY): Payer: Medicare Other

## 2018-02-22 ENCOUNTER — Encounter (HOSPITAL_COMMUNITY)
Admission: RE | Admit: 2018-02-22 | Discharge: 2018-02-22 | Disposition: A | Discharge status: Home / Self Care | Payer: Medicare Other | Source: Ambulatory Visit | Attending: Cardiology | Admitting: Cardiology

## 2018-02-22 DIAGNOSIS — Z9861 Coronary angioplasty status: Secondary | ICD-10-CM

## 2018-02-22 DIAGNOSIS — Z48812 Encounter for surgical aftercare following surgery on the circulatory system: Secondary | ICD-10-CM | POA: Diagnosis not present

## 2018-02-24 ENCOUNTER — Encounter (HOSPITAL_COMMUNITY)
Admission: RE | Admit: 2018-02-24 | Discharge: 2018-02-24 | Disposition: A | Discharge status: Home / Self Care | Payer: Medicare Other | Source: Ambulatory Visit | Attending: Cardiology | Admitting: Cardiology

## 2018-02-24 DIAGNOSIS — Z48812 Encounter for surgical aftercare following surgery on the circulatory system: Secondary | ICD-10-CM | POA: Diagnosis not present

## 2018-02-24 DIAGNOSIS — Z9861 Coronary angioplasty status: Secondary | ICD-10-CM

## 2018-02-26 ENCOUNTER — Encounter (HOSPITAL_COMMUNITY): Payer: Self-pay

## 2018-02-26 ENCOUNTER — Encounter (HOSPITAL_COMMUNITY)
Admission: RE | Admit: 2018-02-26 | Discharge: 2018-02-26 | Disposition: A | Discharge status: Home / Self Care | Payer: Medicare Other | Source: Ambulatory Visit | Attending: Cardiology | Admitting: Cardiology

## 2018-02-26 DIAGNOSIS — Z9861 Coronary angioplasty status: Secondary | ICD-10-CM

## 2018-02-26 DIAGNOSIS — Z48812 Encounter for surgical aftercare following surgery on the circulatory system: Secondary | ICD-10-CM | POA: Diagnosis not present

## 2018-03-05 NOTE — Progress Notes (Signed)
Discharge Progress Report  Patient Details  Name: Deborah [name] MRN: [id number] Date of Birth: 08/18/1951 Referring Provider:     CARDIAC REHAB PHASE II ORIENTATION from 11/10/2017 in [facility] CARDI[facility]  Referring Provider  Viann Fish MD       Number of Visits: 27  Reason for Discharge:  Patient reached a stable level of exercise. Patient independent in their exercise.  Smoking History:  Social History   Tobacco Use  Smoking Status Never Smoker  Smokeless Tobacco Never Used    Diagnosis:  S/P PTCA (percutaneous transluminal coronary angioplasty)  ADL UCSD:   Initial Exercise Prescription: Initial Exercise Prescription - 11/10/17 1500      Date of Initial Exercise RX and Referring Provider   Date  11/10/17    Referring Provider  Viann Fish MD      Recumbant Bike   Level  2    Minutes  10    METs  1.7      NuStep   Level  2    SPM  70    Minutes  10    METs  1.5      Track   Laps  8    Minutes  10    METs  2.39      Prescription Details   Frequency (times per week)  3    Duration  Progress to 30 minutes of continuous aerobic without signs/symptoms of physical distress      Intensity   THRR 40-80% of Max Heartrate  61-122    Ratings of Perceived Exertion  11-13    Perceived Dyspnea  0-4      Progression   Progression  Continue to progress workloads to maintain intensity without signs/symptoms of physical distress.      Resistance Training   Training Prescription  Yes    Weight  2lbs    Reps  10-15       Discharge Exercise Prescription (Final Exercise Prescription Changes): Exercise Prescription Changes - 02/26/18 1410      Response to Exercise   Blood Pressure (Admit)  112/72    Blood Pressure (Exercise)  128/68    Blood Pressure (Exit)  102/68    Heart Rate (Admit)  87 bpm    Heart Rate (Exercise)  92 bpm    Heart Rate (Exit)  77 bpm    Rating of Perceived Exertion (Exercise)  14    Perceived  Dyspnea (Exercise)  0    Symptoms  None     Duration  Continue with 30 min of aerobic exercise without signs/symptoms of physical distress.    Intensity  THRR unchanged      Progression   Progression  Continue to progress workloads to maintain intensity without signs/symptoms of physical distress.    Average METs  2.6      Resistance Training   Training Prescription  Yes    Weight  3lbs    Reps  10-15    Time  10 Minutes      Interval Training   Interval Training  No      Recumbant Bike   Level  2.2    Minutes  10    METs  2.2      NuStep   Level  2    SPM  95    Minutes  10    METs  2.3      Arm Ergometer   Level  1.5    Watts  27    Minutes  10    METs  3      Home Exercise Plan   Plans to continue exercise at  Home (comment) Walking     Frequency  Add 2 additional days to program exercise sessions.    Initial Home Exercises Provided  12/07/17       Functional Capacity: 6 Minute Walk    Row Name 11/10/17 1500 02/15/18 1636 02/15/18 1637     6 Minute Walk   Phase  Initial  -  Discharge   Distance  1483 feet  16 feet  1619 feet   Distance % Change  -  -  9.17 %   Distance Feet Change  -  -  136 ft   Walk Time  6 minutes  -  6 minutes   # of Rest Breaks  0  -  0   MPH  2.8  -  3.1   METS  3.47  -  3.74   RPE  11  -  11   Perceived Dyspnea   -  -  0   VO2 Peak  12.15  -  13.1   Symptoms  No  -  No   Resting HR  68 bpm  -  92 bpm   Resting BP  122/68  -  102/60   Resting Oxygen Saturation   98 %  -  -   Exercise Oxygen Saturation  during 6 min walk  100 %  -  -   Max Ex. HR  113 bpm  -  118 bpm   Max Ex. BP  134/64  -  132/70   2 Minute Post BP  -  -  112/69      Psychological, QOL, Others - Outcomes: PHQ 2/9: Depression screen [facility] 2/9 02/26/2018 11/16/2017  Decreased Interest 0 0  Down, Depressed, Hopeless 0 0  PHQ - 2 Score 0 0    Quality of Life: Quality of Life - 03/01/18 1409      Quality of Life   Select  Quality of Life      Quality  of Life Scores   Health/Function Pre  25.97 %    Health/Function Post  24.54 %    Health/Function % Change  -5.51 %    Socioeconomic Pre  27.56 %    Socioeconomic Post  24.71 %    Socioeconomic % Change   -10.34 %    Psych/Spiritual Pre  30 %    Psych/Spiritual Post  25.25 %    Psych/Spiritual % Change  -15.83 %    Family Pre  30 %    Family Post  27 %    Family % Change  -10 %    GLOBAL Pre  27.6 %    GLOBAL Post  25.07 %    GLOBAL % Change  -9.17 %       Personal Goals: Goals established at orientation with interventions provided to work toward goal. Personal Goals and Risk Factors at Admission - 11/10/17 1522      Core Components/Risk Factors/Patient Goals on Admission   Admit Weight  144 lb 2.9 oz (65.4 kg)    Goal Weight: Short Term  140 lb (63.5 kg)    Goal Weight: Long Term  136 lb (61.7 kg)    Expected Outcomes  Short Term: Continue to assess and modify interventions until short term weight is achieved;Long Term: Adherence to nutrition and physical activity/exercise program aimed  toward attainment of established weight goal;Weight Loss: Understanding of general recommendations for a balanced deficit meal plan, which promotes 1-2 lb weight loss per week and includes a negative energy balance of [phone] kcal/d;Understanding recommendations for meals to include 15-35% energy as protein, 25-35% energy from fat, 35-60% energy from carbohydrates, less than 200mg  of dietary cholesterol, 20-35 gm of total fiber daily    Hypertension  Yes    Intervention  Provide education on lifestyle modifcations including regular physical activity/exercise, weight management, moderate sodium restriction and increased consumption of fresh fruit, vegetables, and low fat dairy, alcohol moderation, and smoking cessation.;Monitor prescription use compliance.    Expected Outcomes  Short Term: Continued assessment and intervention until BP is < 140/79mm HG in hypertensive participants. < 130/33mm HG in  hypertensive participants with diabetes, heart failure or chronic kidney disease.;Long Term: Maintenance of blood pressure at goal levels.    Lipids  Yes    Intervention  Provide education and support for participant on nutrition & aerobic/resistive exercise along with prescribed medications to achieve LDL 70mg , HDL >40mg .    Expected Outcomes  Short Term: Participant states understanding of desired cholesterol values and is compliant with medications prescribed. Participant is following exercise prescription and nutrition guidelines.;Long Term: Cholesterol controlled with medications as prescribed, with individualized exercise RX and with personalized nutrition plan. Value goals: LDL < 70mg , HDL > 40 mg.    Stress  Yes    Intervention  Offer individual and/or small group education and counseling on adjustment to heart disease, stress management and health-related lifestyle change. Teach and support self-help strategies.;Refer participants experiencing significant psychosocial distress to appropriate mental health specialists for further evaluation and treatment. When possible, include family members and significant others in education/counseling sessions.    Expected Outcomes  Short Term: Participant demonstrates changes in health-related behavior, relaxation and other stress management skills, ability to obtain effective social support, and compliance with psychotropic medications if prescribed.;Long Term: Emotional wellbeing is indicated by absence of clinically significant psychosocial distress or social isolation.        Personal Goals Discharge: Goals and Risk Factor Review    Row Name 11/16/17 1638 12/16/17 1717 01/14/18 1230 02/11/18 1119 03/01/18 1635     Core Components/Risk Factors/Patient Goals Review   Personal Goals Review  Hypertension;Stress;Lipids  Hypertension;Stress;Lipids  Hypertension;Stress;Lipids  Hypertension;Stress;Lipids  Hypertension;Stress;Lipids   Review  Pt with  multiple CAD RF.  Pt is eager to participate in CR exercises.   Pt with multiple CAD RF.  Pt is eager to participate in CR exercises. Pt is going to attempt to have a more regular attendance.   Pt with multiple CAD RF.  Pt is eager to participate in CR exercises. Florentina Addison still struggles with attendance.  She has attended a later session a couple of times though at this time she does not want to switch permanently.  Florentina Addison does report increasing her walking at home due to her participation in exercise.  Pt with multiple CAD RF.  Florentina Addison still struggles with attendance due to prior obligations or pain.  Encouraged pt to come to remaining sessions or if pain is in issue in the morning to come to later sessions.  Pt with multiple CAD RF has completed CR Program with 27 sessions. [name]'s vitals remained stable.  Her attendance continued to be a source of struggle for Florentina Addison.    Expected Outcomes  Pt will participate in CR exercises and educational opportunities including nutrition, lifestyle modifications, and stress education.   Pt will participate  in CR exercises and educational opportunities including nutrition, lifestyle modifications, and stress education.   Pt will participate in CR exercises and educational opportunities including nutrition, lifestyle modifications, and stress education.   Pt will participate in CR exercises and educational opportunities including nutrition, lifestyle modifications, and stress education.   Jae Dire will continue to participate in CR exercise, nutrition, and lifestlye modification opportunities.  [facility] [employer] and classes.  She is going to continue to walk.       Exercise Goals and Review: Exercise Goals    Row Name 11/10/17 1518             Exercise Goals   Increase Physical Activity  Yes       Intervention  Provide advice, education, support and counseling about physical activity/exercise needs.;Develop an  individualized exercise prescription for aerobic and resistive training based on initial evaluation findings, risk stratification, comorbidities and participant's personal goals.       Expected Outcomes  Short Term: Attend rehab on a regular basis to increase amount of physical activity.;Long Term: Add in home exercise to make exercise part of routine and to increase amount of physical activity.;Long Term: Exercising regularly at least 3-5 days a week.       Increase Strength and Stamina  Yes       Intervention  Provide advice, education, support and counseling about physical activity/exercise needs.;Develop an individualized exercise prescription for aerobic and resistive training based on initial evaluation findings, risk stratification, comorbidities and participant's personal goals.       Expected Outcomes  Short Term: Increase workloads from initial exercise prescription for resistance, speed, and METs.;Short Term: Perform resistance training exercises routinely during rehab and add in resistance training at home;Long Term: Improve cardiorespiratory fitness, muscular endurance and strength as measured by increased METs and functional capacity ( )       Able to understand and use rate of perceived exertion (RPE) scale  Yes       Intervention  Provide education and explanation on how to use RPE scale       Expected Outcomes  Short Term: Able to use RPE daily in rehab to express subjective intensity level;Long Term:  Able to use RPE to guide intensity level when exercising independently       Knowledge and understanding of Target Heart Rate Range (THRR)  Yes       Intervention  Provide education and explanation of THRR including how the numbers were predicted and where they are located for reference       Expected Outcomes  Short Term: Able to state/look up THRR;Long Term: Able to use THRR to govern intensity when exercising independently;Short Term: Able to use daily as guideline for intensity in  rehab       Able to check pulse independently  Yes       Intervention  Provide education and demonstration on how to check pulse in carotid and radial arteries.;Review the importance of being able to check your own pulse for safety during independent exercise       Expected Outcomes  Short Term: Able to explain why pulse checking is important during independent exercise;Long Term: Able to check pulse independently and accurately       Understanding of Exercise Prescription  Yes       Intervention  Provide education, explanation, and written materials on patient's individual exercise prescription       Expected Outcomes  Short  Term: Able to explain program exercise prescription;Long Term: Able to explain home exercise prescription to exercise independently          Nutrition & Weight - Outcomes: Pre Biometrics - 11/10/17 1522      Pre Biometrics   Height  5' 2.5" (1.588 m)    Weight  144 lb 2.9 oz (65.4 kg)    Waist Circumference  34 inches    Hip Circumference  37.75 inches    Waist to Hip Ratio  0.9 %    BMI (Calculated)  25.93    Triceps Skinfold  18 mm    % Body Fat  35.6 %    Grip Strength  27 kg    Flexibility  0 in    Single Leg Stand  20 seconds      Post Biometrics - 02/15/18 1640       Post  Biometrics   Height  5' 2.5" (1.588 m)    Weight  144 lb 2.9 oz (65.4 kg)    Waist Circumference  34 inches    Hip Circumference  38 inches    Waist to Hip Ratio  0.89 %    BMI (Calculated)  25.93    Triceps Skinfold  18 mm    % Body Fat  35.6 %    Grip Strength  25 kg    Flexibility  0 in    Single Leg Stand  27.87 seconds       Nutrition: Nutrition Therapy & Goals - 11/10/17 1518      Nutrition Therapy   Diet  Heart Healthy      Personal Nutrition Goals   Nutrition Goal  Pt to identify and limit food sources of saturated fat, trans fat, and sodium.    Personal Goal #2  Wt loss of 1-2 lb/week to a wt loss goal of 5 lb at graduation from Cardiac Rehab. Goal wt of 138  lb desired.       Intervention Plan   Intervention  Prescribe, educate and counsel regarding individualized specific dietary modifications aiming towards targeted core components such as weight, hypertension, lipid management, diabetes, heart failure and other comorbidities.    Expected Outcomes  Short Term Goal: Understand basic principles of dietary content, such as calories, fat, sodium, cholesterol and nutrients.;Long Term Goal: Adherence to prescribed nutrition plan.       Nutrition Discharge: Nutrition Assessments - 02/26/18 1543      MEDFICTS Scores   Pre Score  54    Post Score  33    Score Difference  -21       Education Questionnaire Score: Knowledge Questionnaire Score - 03/01/18 1408      Knowledge Questionnaire Score   Pre Score  18/24     Post Score  24/24       Goals reviewed with patient; copy given to patient.

## 2018-06-11 ENCOUNTER — Telehealth: Payer: Self-pay

## 2018-06-11 ENCOUNTER — Other Ambulatory Visit: Payer: Self-pay | Admitting: Cardiology

## 2018-06-11 MED ORDER — CLOPIDOGREL BISULFATE 75 MG PO TABS: 75.0000 mg | ORAL_TABLET | Freq: Every day | ORAL | 0 refills | Status: DC

## 2018-06-11 MED ORDER — ATORVASTATIN CALCIUM 40 MG PO TABS: 40.0000 mg | ORAL_TABLET | Freq: Every day | ORAL | 0 refills | Status: DC

## 2018-06-11 MED ORDER — CARVEDILOL 6.25 MG PO TABS: 6.2500 mg | ORAL_TABLET | Freq: Two times a day (BID) | ORAL | 0 refills | Status: DC

## 2018-06-11 NOTE — Telephone Encounter (Signed)
Refills requested for Dr. Donnie Aho patient on Atorvastatin and Carvedilol 30 day refill sent in to [facility].

## 2018-06-11 NOTE — Telephone Encounter (Signed)
Refill for Dr. Donnie Aho patient for Plavix. Okayed per Dr. Bing Matter

## 2018-06-13 ENCOUNTER — Other Ambulatory Visit: Payer: Self-pay | Admitting: Cardiology
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
## 2018-07-02 ENCOUNTER — Ambulatory Visit (INDEPENDENT_AMBULATORY_CARE_PROVIDER_SITE_OTHER): Payer: Medicare Other | Admitting: Cardiology

## 2018-07-02 ENCOUNTER — Encounter: Payer: Self-pay | Admitting: *Deleted

## 2018-07-02 VITALS — BP 110/74 | HR 69 | Ht 62.0 in | Wt 143.8 lb

## 2018-07-02 DIAGNOSIS — E785 Hyperlipidemia, unspecified: Secondary | ICD-10-CM

## 2018-07-02 DIAGNOSIS — I25118 Atherosclerotic heart disease of native coronary artery with other forms of angina pectoris: Secondary | ICD-10-CM

## 2018-07-02 DIAGNOSIS — R0600 Dyspnea, unspecified: Secondary | ICD-10-CM

## 2018-07-02 DIAGNOSIS — J449 Chronic obstructive pulmonary disease, unspecified: Secondary | ICD-10-CM | POA: Insufficient documentation

## 2018-07-02 DIAGNOSIS — K219 Gastro-esophageal reflux disease without esophagitis: Secondary | ICD-10-CM

## 2018-07-02 DIAGNOSIS — I700 Atherosclerosis of aorta: Secondary | ICD-10-CM | POA: Insufficient documentation

## 2018-07-02 HISTORY — DX: Dyspnea, unspecified: R06.00

## 2018-07-02 HISTORY — DX: Gastro-esophageal reflux disease without esophagitis: K21.9

## 2018-07-02 HISTORY — DX: Atherosclerosis of aorta: I70.0

## 2018-07-02 HISTORY — DX: Chronic obstructive pulmonary disease, unspecified: J44.9

## 2018-07-02 MED ORDER — NITROGLYCERIN 0.4 MG SL SUBL: 0.4000 mg | SUBLINGUAL_TABLET | SUBLINGUAL | 11 refills | Status: DC | PRN

## 2018-07-02 NOTE — Addendum Note (Signed)
Addended by: Crist Fat on: 07/02/2018 11:55 AM   Modules accepted: Orders

## 2018-07-02 NOTE — Patient Instructions (Signed)
Medication Instructions:  Your physician recommends that you continue on your current medications as directed. Please refer to the Current Medication list given to you today.  If you need a refill on your cardiac medications before your next appointment, please call your pharmacy.   Lab work: None  If you have labs (blood work) drawn today and your tests are completely normal, you will receive your results only by: Marland Kitchen MyChart Message (if you have MyChart) OR . A paper copy in the mail If you have any lab test that is abnormal or we need to change your treatment, we will call you to review the results.  Testing/Procedures: You had an EKG today.   Follow-Up: At [facility], you and your health needs are our priority.  As part of our continuing mission to provide you with exceptional heart care, we have created designated Provider Care Teams.  These Care Teams include your primary Cardiologist (physician) and Advanced Practice Providers (APPs -  Physician Assistants and Nurse Practitioners) who all work together to provide you with the care you need, when you need it.  Your physician recommends that you schedule a follow-up appointment in 1 year with Dr. Donnie Aho.

## 2018-07-13 ENCOUNTER — Other Ambulatory Visit: Payer: Self-pay | Admitting: Cardiology

## 2018-08-17 ENCOUNTER — Other Ambulatory Visit: Payer: Self-pay | Admitting: Internal Medicine

## 2018-08-17 DIAGNOSIS — I739 Peripheral vascular disease, unspecified: Secondary | ICD-10-CM

## 2018-08-20 ENCOUNTER — Inpatient Hospital Stay: Admission: RE | Admit: 2018-08-20 | Payer: Medicare Other | Source: Ambulatory Visit

## 2018-08-30 ENCOUNTER — Ambulatory Visit
Admission: RE | Admit: 2018-08-30 | Discharge: 2018-08-30 | Disposition: A | Discharge status: Home / Self Care | Payer: Medicare Other | Source: Ambulatory Visit | Attending: Internal Medicine | Admitting: Internal Medicine

## 2018-08-30 DIAGNOSIS — I739 Peripheral vascular disease, unspecified: Secondary | ICD-10-CM

## 2019-01-12 ENCOUNTER — Telehealth: Payer: Self-pay | Admitting: Cardiology

## 2019-01-12 ENCOUNTER — Other Ambulatory Visit: Payer: Self-pay | Admitting: *Deleted

## 2019-01-12 MED ORDER — ATORVASTATIN CALCIUM 40 MG PO TABS: ORAL_TABLET | ORAL | 1 refills | Status: DC

## 2019-01-12 NOTE — Telephone Encounter (Signed)
°*  STAT* If patient is at the pharmacy, call can be transferred to refill team.   1. Which medications need to be refilled? (please list name of each medication and dose if known) atorvastatin (LIPITOR) 40 MG tablet   2. Which pharmacy/location (including street and city if local pharmacy) is medication to be sent to?  [id number] [place], [place] - [address] AT [facility] [address] & [facility] [address] [phone] (Phone) [phone] (Fax)    3. Do they need a 30 day or 90 day supply? 90 day

## 2019-04-08 ENCOUNTER — Other Ambulatory Visit: Payer: Self-pay | Admitting: Internal Medicine

## 2019-04-08 ENCOUNTER — Ambulatory Visit
Admission: RE | Admit: 2019-04-08 | Discharge: 2019-04-08 | Disposition: A | Discharge status: Home / Self Care | Payer: Medicare Other | Source: Ambulatory Visit | Attending: Internal Medicine | Admitting: Internal Medicine

## 2019-04-08 DIAGNOSIS — R059 Cough, unspecified: Secondary | ICD-10-CM

## 2019-04-08 DIAGNOSIS — R05 Cough: Secondary | ICD-10-CM

## 2019-05-03 ENCOUNTER — Ambulatory Visit
Admission: RE | Admit: 2019-05-03 | Discharge: 2019-05-03 | Disposition: A | Discharge status: Home / Self Care | Payer: Medicare Other | Source: Ambulatory Visit | Attending: Internal Medicine | Admitting: Internal Medicine

## 2019-05-03 ENCOUNTER — Other Ambulatory Visit: Payer: Self-pay | Admitting: Internal Medicine

## 2019-05-03 DIAGNOSIS — J189 Pneumonia, unspecified organism: Secondary | ICD-10-CM

## 2019-07-06 ENCOUNTER — Other Ambulatory Visit: Payer: Self-pay

## 2019-07-06 ENCOUNTER — Ambulatory Visit (INDEPENDENT_AMBULATORY_CARE_PROVIDER_SITE_OTHER): Payer: Medicare Other | Admitting: Family

## 2019-07-06 ENCOUNTER — Encounter: Payer: Self-pay | Admitting: Family

## 2019-07-06 VITALS — BP 110/62 | HR 82 | Ht 63.0 in | Wt 141.4 lb

## 2019-07-06 DIAGNOSIS — E785 Hyperlipidemia, unspecified: Secondary | ICD-10-CM | POA: Diagnosis not present

## 2019-07-06 DIAGNOSIS — I25118 Atherosclerotic heart disease of native coronary artery with other forms of angina pectoris: Secondary | ICD-10-CM | POA: Diagnosis not present

## 2019-07-06 DIAGNOSIS — Z79899 Other long term (current) drug therapy: Secondary | ICD-10-CM

## 2019-07-06 MED ORDER — CARVEDILOL 3.125 MG PO TABS: 3.1250 mg | ORAL_TABLET | Freq: Two times a day (BID) | ORAL | 1 refills | Status: DC

## 2019-07-06 MED ORDER — NITROGLYCERIN 0.4 MG SL SUBL: 0.4000 mg | SUBLINGUAL_TABLET | SUBLINGUAL | 11 refills | Status: DC | PRN
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
## 2019-07-06 NOTE — Patient Instructions (Signed)
Medication Instructions:  Your physician has recommended you make the following change in your medication:   CHANGE Carvedilol (Coreg) to 3.125 mg twice daily.  You may use up your 6.25mg  tablets by taking a half tablet twice per day.   *If you need a refill on your cardiac medications before your next appointment, please call your pharmacy*  Lab Work: Your physician recommends that you return for lab work today: CMET, Lipid profile, direct LDL  If you have labs (blood work) drawn today and your tests are completely normal, you will receive your results only by: Marland Kitchen MyChart Message (if you have MyChart) OR . A paper copy in the mail If you have any lab test that is abnormal or we need to change your treatment, we will call you to review the results.  Testing/Procedures: None today  Follow-Up: At [facility], you and your health needs are our priority.  As part of our continuing mission to provide you with exceptional heart care, we have created designated Provider Care Teams.  These Care Teams include your primary Cardiologist (physician) and Advanced Practice Providers (APPs -  Physician Assistants and Nurse Practitioners) who all work together to provide you with the care you need, when you need it.  Your next appointment:   12 months  The format for your next appointment:   In Person  Provider:   Shirlee More, MD  Other Instructions  We have checked your cholesterol numbers today. This will help [place] determine how to change your cholesterol medications. We will decide how to change them once we get the lab reults back.

## 2019-07-07 ENCOUNTER — Encounter: Payer: Self-pay | Admitting: Family

## 2019-07-07 ENCOUNTER — Telehealth: Payer: Self-pay | Admitting: Family

## 2019-07-07 LAB — COMPREHENSIVE METABOLIC PANEL
ALT: 16 IU/L (ref 0–32)
AST: 25 IU/L (ref 0–40)
Albumin/Globulin Ratio: 1.9 (ref 1.2–2.2)
Albumin: 4.5 g/dL (ref 3.8–4.8)
Alkaline Phosphatase: 98 IU/L (ref 39–117)
BUN/Creatinine Ratio: 20 (ref 12–28)
BUN: 15 mg/dL (ref 8–27)
Bilirubin Total: 0.5 mg/dL (ref 0.0–1.2)
CO2: 24 mmol/L (ref 20–29)
Calcium: 9.3 mg/dL (ref 8.7–10.3)
Chloride: 99 mmol/L (ref 96–106)
Creatinine, Ser: 0.74 mg/dL (ref 0.57–1.00)
GFR calc Af Amer: 95 mL/min/{1.73_m2} (ref >59)
GFR calc non Af Amer: 82 mL/min/{1.73_m2} (ref >59)
Globulin, Total: 2.4 g/dL (ref 1.5–4.5)
Glucose: 82 mg/dL (ref 65–99)
Potassium: 4.5 mmol/L (ref 3.5–5.2)
Sodium: 136 mmol/L (ref 134–144)
Total Protein: 6.9 g/dL (ref 6.0–8.5)

## 2019-07-07 LAB — LIPID PANEL
Chol/HDL Ratio: 2.5 ratio (ref 0.0–4.4)
Cholesterol, Total: 178 mg/dL (ref 100–199)
HDL: 70 mg/dL (ref >39)
LDL Chol Calc (NIH): 96 mg/dL (ref 0–99)
Triglycerides: 61 mg/dL (ref 0–149)
VLDL Cholesterol Cal: 12 mg/dL (ref 5–40)

## 2019-07-07 LAB — LDL CHOLESTEROL, DIRECT: LDL Direct: 96 mg/dL (ref 0–99)

## 2019-07-07 NOTE — Telephone Encounter (Signed)
Telephone call to review lab results.   Normal kidney, liver function. Electrolytes normal.   With hx of CAD LDL goal <70. Her LDL was 96. Discussed option of switching from Atorvastatin 40 mg to Crestor 10mg  for improved LDL control. Could also consider adding Zetia 10mg  daily. At this time she prefers to follow with her primary care and make dietary changes.   A copy of her lab results will be mailed to her per her request. Will also include note for her PCP regarding our recommendations if she chooses to change her lipid lowering therapy.  Loel Dubonnet, NP

## 2019-08-08 ENCOUNTER — Other Ambulatory Visit: Payer: Self-pay | Admitting: Cardiology

## 2019-08-08 MED ORDER — CLOPIDOGREL BISULFATE 75 MG PO TABS: ORAL_TABLET | ORAL | 0 refills | Status: DC

## 2019-08-08 NOTE — Telephone Encounter (Signed)
°*  STAT* If patient is at the pharmacy, call can be transferred to refill team.   1. Which medications need to be refilled? (please list name of each medication and dose if known) clopidogrel 75mg   2. Which pharmacy/location (including street and city if local pharmacy) is medication to be sent to? [facility] on [address]   3. Do they need a 30 day or 90 day supply? [place]

## 2019-08-08 NOTE — Telephone Encounter (Signed)
Plavix refill sent to [facility] on [address].

## 2019-09-13 ENCOUNTER — Other Ambulatory Visit: Payer: Self-pay | Admitting: Internal Medicine

## 2019-09-13 DIAGNOSIS — M810 Age-related osteoporosis without current pathological fracture: Secondary | ICD-10-CM

## 2019-09-30 ENCOUNTER — Ambulatory Visit
Admission: RE | Admit: 2019-09-30 | Discharge: 2019-09-30 | Disposition: A | Discharge status: Home / Self Care | Payer: Medicare Other | Source: Ambulatory Visit | Attending: Internal Medicine | Admitting: Internal Medicine

## 2019-09-30 ENCOUNTER — Other Ambulatory Visit: Payer: Self-pay | Admitting: Internal Medicine

## 2019-09-30 DIAGNOSIS — R918 Other nonspecific abnormal finding of lung field: Secondary | ICD-10-CM

## 2019-10-01 ENCOUNTER — Other Ambulatory Visit: Payer: Self-pay | Admitting: Family

## 2019-10-01 DIAGNOSIS — I25118 Atherosclerotic heart disease of native coronary artery with other forms of angina pectoris: Secondary | ICD-10-CM

## 2019-12-13 ENCOUNTER — Telehealth: Payer: Self-pay

## 2019-12-13 MED ORDER — CLOPIDOGREL BISULFATE 75 MG PO TABS: ORAL_TABLET | ORAL | 0 refills | Status: DC

## 2019-12-13 NOTE — Telephone Encounter (Signed)
Spoke to patient in regards to refilling her Plavix. A refill was sent in to the [facility] pharmacy.    Encouraged patient to call back with any questions or concerns.

## 2020-04-18 ENCOUNTER — Other Ambulatory Visit: Payer: Self-pay | Admitting: Family

## 2020-04-18 DIAGNOSIS — I25118 Atherosclerotic heart disease of native coronary artery with other forms of angina pectoris: Secondary | ICD-10-CM

## 2020-05-03 ENCOUNTER — Other Ambulatory Visit: Payer: Self-pay | Admitting: Cardiology

## 2020-06-19 ENCOUNTER — Other Ambulatory Visit: Payer: Self-pay | Admitting: Cardiology

## 2020-06-19 MED ORDER — CLOPIDOGREL BISULFATE 75 MG PO TABS: ORAL_TABLET | ORAL | 0 refills | Status: DC

## 2020-06-19 NOTE — Telephone Encounter (Signed)
Spoke to the patient just now and sent in a refill on her Plavix to get her through to her appointment in November. She verbalizes understanding and thanks me for the call back.

## 2020-06-19 NOTE — Telephone Encounter (Signed)
*  STAT* If patient is at the pharmacy, call can be transferred to refill team.   1. Which medications need to be refilled? (please list name of each medication and dose if known)  2. Which pharmacy/location (including street and city if local pharmacy) is medication to be sent to?[facility] [phone]  3. Do they need a 30 day or 90 day supply? She needs enough to make it to her appt in Nov.

## 2020-07-11 DIAGNOSIS — H9190 Unspecified hearing loss, unspecified ear: Secondary | ICD-10-CM | POA: Insufficient documentation
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
## 2020-07-17 ENCOUNTER — Other Ambulatory Visit: Payer: Self-pay

## 2020-07-17 ENCOUNTER — Ambulatory Visit (INDEPENDENT_AMBULATORY_CARE_PROVIDER_SITE_OTHER): Payer: Medicare Other | Admitting: Cardiology

## 2020-07-17 ENCOUNTER — Encounter: Payer: Self-pay | Admitting: Cardiology

## 2020-07-17 VITALS — BP 112/70 | HR 68 | Ht 63.0 in | Wt 145.0 lb

## 2020-07-17 DIAGNOSIS — I25118 Atherosclerotic heart disease of native coronary artery with other forms of angina pectoris: Secondary | ICD-10-CM | POA: Diagnosis not present

## 2020-07-17 DIAGNOSIS — E782 Mixed hyperlipidemia: Secondary | ICD-10-CM | POA: Diagnosis not present

## 2020-07-17 MED ORDER — NITROGLYCERIN 0.4 MG SL SUBL: 0.4000 mg | SUBLINGUAL_TABLET | SUBLINGUAL | 11 refills | Status: DC | PRN

## 2020-07-17 MED ORDER — CARVEDILOL 3.125 MG PO TABS: ORAL_TABLET | ORAL | 3 refills | Status: DC

## 2020-07-17 NOTE — Patient Instructions (Signed)

## 2020-07-24 ENCOUNTER — Other Ambulatory Visit: Payer: Self-pay | Admitting: Cardiology

## 2020-10-17 ENCOUNTER — Other Ambulatory Visit: Payer: Self-pay | Admitting: Internal Medicine

## 2020-10-17 DIAGNOSIS — Z9181 History of falling: Secondary | ICD-10-CM

## 2020-10-17 DIAGNOSIS — M542 Cervicalgia: Secondary | ICD-10-CM

## 2020-10-27 ENCOUNTER — Other Ambulatory Visit: Payer: Self-pay

## 2020-10-27 ENCOUNTER — Ambulatory Visit
Admission: RE | Admit: 2020-10-27 | Discharge: 2020-10-27 | Disposition: A | Discharge status: Home / Self Care | Payer: Medicare Other | Source: Ambulatory Visit | Attending: Internal Medicine | Admitting: Internal Medicine

## 2020-10-27 DIAGNOSIS — Z9181 History of falling: Secondary | ICD-10-CM

## 2020-10-27 DIAGNOSIS — M542 Cervicalgia: Secondary | ICD-10-CM

## 2020-11-20 ENCOUNTER — Ambulatory Visit: Payer: Medicare Other | Admitting: Podiatry

## 2020-11-23 ENCOUNTER — Ambulatory Visit (INDEPENDENT_AMBULATORY_CARE_PROVIDER_SITE_OTHER): Payer: Medicare Other | Admitting: Podiatry

## 2020-11-23 ENCOUNTER — Other Ambulatory Visit: Payer: Self-pay

## 2020-11-23 DIAGNOSIS — B351 Tinea unguium: Secondary | ICD-10-CM

## 2020-11-23 DIAGNOSIS — M2012 Hallux valgus (acquired), left foot: Secondary | ICD-10-CM

## 2020-11-23 DIAGNOSIS — M21612 Bunion of left foot: Secondary | ICD-10-CM

## 2020-11-23 DIAGNOSIS — M2042 Other hammer toe(s) (acquired), left foot: Secondary | ICD-10-CM

## 2020-11-30 NOTE — Progress Notes (Signed)
  Subjective:  Patient ID: Deborah [name], female    DOB: [birth year],  MRN: [id number]  Chief Complaint  Patient presents with  . Nail Problem    Right 1st toenail growing painfully  . Numbness    Pt states numbness due to nerve damage  . Hammer Toe    Pt states curved toes due to wearing high heels for many years.    70 y.o. female presents with the above complaint. History confirmed with patient.   Objective:  Physical Exam: warm, good capillary refill, no trophic changes or ulcerative lesions, normal DP and PT pulses and normal sensory exam.  Hallux valgus left with hammertoes of the left foot.  Pain to palpation about the first and second toes.  Pain to palpation about the right great toenail. Assessment:   1. Hammer toe of left foot   2. 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 Onychomycosis      Plan:  Patient was evaluated and treated and all questions answered.  Onychomycosis -Nails palliative debrided  Hallux valgus left, hammertoes -Educated on the etiology of these deformities -Dispensed silicone toe spacer and toe cap for the great toe.   No follow-ups on file.

## 2020-12-07 ENCOUNTER — Ambulatory Visit
Admission: RE | Admit: 2020-12-07 | Discharge: 2020-12-07 | Disposition: A | Discharge status: Home / Self Care | Payer: Medicare Other | Source: Ambulatory Visit | Attending: Internal Medicine | Admitting: Internal Medicine

## 2020-12-07 ENCOUNTER — Other Ambulatory Visit: Payer: Self-pay | Admitting: Internal Medicine

## 2020-12-07 ENCOUNTER — Other Ambulatory Visit: Payer: Self-pay

## 2020-12-07 DIAGNOSIS — W19XXXA Unspecified fall, initial encounter: Secondary | ICD-10-CM

## 2021-01-11 ENCOUNTER — Other Ambulatory Visit: Payer: Self-pay | Admitting: Internal Medicine

## 2021-01-11 DIAGNOSIS — H0530 Unspecified deformity of orbit: Secondary | ICD-10-CM

## 2021-01-30 ENCOUNTER — Ambulatory Visit
Admission: RE | Admit: 2021-01-30 | Discharge: 2021-01-30 | Disposition: A | Discharge status: Home / Self Care | Payer: Medicare Other | Source: Ambulatory Visit | Attending: Internal Medicine | Admitting: Internal Medicine

## 2021-01-30 DIAGNOSIS — H0530 Unspecified deformity of orbit: Secondary | ICD-10-CM

## 2021-07-24 ENCOUNTER — Other Ambulatory Visit: Payer: Self-pay | Admitting: Cardiology

## 2021-07-24 DIAGNOSIS — I25118 Atherosclerotic heart disease of native coronary artery with other forms of angina pectoris: Secondary | ICD-10-CM

## 2021-09-25 ENCOUNTER — Encounter: Payer: Self-pay | Admitting: Cardiology

## 2021-09-25 ENCOUNTER — Ambulatory Visit (INDEPENDENT_AMBULATORY_CARE_PROVIDER_SITE_OTHER): Payer: Medicare Other | Admitting: Cardiology

## 2021-09-25 ENCOUNTER — Other Ambulatory Visit: Payer: Self-pay

## 2021-09-25 VITALS — BP 116/78 | HR 71 | Ht 63.0 in | Wt 138.0 lb

## 2021-09-25 DIAGNOSIS — E782 Mixed hyperlipidemia: Secondary | ICD-10-CM | POA: Diagnosis not present

## 2021-09-25 DIAGNOSIS — I25118 Atherosclerotic heart disease of native coronary artery with other forms of angina pectoris: Secondary | ICD-10-CM
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
## 2021-09-25 NOTE — Patient Instructions (Signed)

## 2021-09-26 LAB — COMPREHENSIVE METABOLIC PANEL
ALT: 19 IU/L (ref 0–32)
AST: 25 IU/L (ref 0–40)
Albumin/Globulin Ratio: 2.1 (ref 1.2–2.2)
Albumin: 4.5 g/dL (ref 3.7–4.7)
Alkaline Phosphatase: 89 IU/L (ref 44–121)
BUN/Creatinine Ratio: 20 (ref 12–28)
BUN: 14 mg/dL (ref 8–27)
Bilirubin Total: 0.5 mg/dL (ref 0.0–1.2)
CO2: 27 mmol/L (ref 20–29)
Calcium: 9.2 mg/dL (ref 8.7–10.3)
Chloride: 98 mmol/L (ref 96–106)
Creatinine, Ser: 0.7 mg/dL (ref 0.57–1.00)
Globulin, Total: 2.1 g/dL (ref 1.5–4.5)
Glucose: 82 mg/dL (ref 70–99)
Potassium: 4.4 mmol/L (ref 3.5–5.2)
Sodium: 135 mmol/L (ref 134–144)
Total Protein: 6.6 g/dL (ref 6.0–8.5)
eGFR: 92 mL/min/{1.73_m2} (ref >59)

## 2021-09-26 LAB — LIPID PANEL
Chol/HDL Ratio: 2.9 ratio (ref 0.0–4.4)
Cholesterol, Total: 187 mg/dL (ref 100–199)
HDL: 65 mg/dL (ref >39)
LDL Chol Calc (NIH): 108 mg/dL — ABNORMAL HIGH (ref 0–99)
Triglycerides: 79 mg/dL (ref 0–149)
VLDL Cholesterol Cal: 14 mg/dL (ref 5–40)

## 2021-12-02 ENCOUNTER — Other Ambulatory Visit: Payer: Self-pay | Admitting: Internal Medicine

## 2021-12-02 DIAGNOSIS — Z9889 Other specified postprocedural states: Secondary | ICD-10-CM

## 2021-12-02 DIAGNOSIS — M25551 Pain in right hip: Secondary | ICD-10-CM

## 2021-12-13 ENCOUNTER — Other Ambulatory Visit: Payer: Self-pay | Admitting: Internal Medicine

## 2021-12-13 DIAGNOSIS — Z9889 Other specified postprocedural states: Secondary | ICD-10-CM

## 2021-12-13 DIAGNOSIS — M25551 Pain in right hip: Secondary | ICD-10-CM

## 2021-12-14 ENCOUNTER — Other Ambulatory Visit: Payer: Medicare Other

## 2021-12-14 ENCOUNTER — Ambulatory Visit
Admission: RE | Admit: 2021-12-14 | Discharge: 2021-12-14 | Disposition: A | Discharge status: Home / Self Care | Payer: Medicare Other | Source: Ambulatory Visit | Attending: Internal Medicine | Admitting: Internal Medicine

## 2021-12-14 DIAGNOSIS — Z9889 Other specified postprocedural states: Secondary | ICD-10-CM

## 2021-12-14 DIAGNOSIS — M25551 Pain in right hip: Secondary | ICD-10-CM

## 2021-12-17 ENCOUNTER — Other Ambulatory Visit: Payer: Self-pay | Admitting: Neurological Surgery

## 2021-12-17 ENCOUNTER — Other Ambulatory Visit (HOSPITAL_COMMUNITY): Payer: Self-pay | Admitting: Neurological Surgery

## 2021-12-17 DIAGNOSIS — M25551 Pain in right hip: Secondary | ICD-10-CM

## 2021-12-17 DIAGNOSIS — M5416 Radiculopathy, lumbar region: Secondary | ICD-10-CM

## 2021-12-23 ENCOUNTER — Ambulatory Visit (HOSPITAL_COMMUNITY)
Admission: RE | Admit: 2021-12-23 | Discharge: 2021-12-23 | Disposition: A | Discharge status: Home / Self Care | Payer: Medicare Other | Source: Ambulatory Visit | Attending: Neurological Surgery | Admitting: Neurological Surgery

## 2021-12-23 DIAGNOSIS — M879 Osteonecrosis, unspecified: Secondary | ICD-10-CM | POA: Diagnosis not present

## 2021-12-23 DIAGNOSIS — M5416 Radiculopathy, lumbar region: Secondary | ICD-10-CM

## 2021-12-23 DIAGNOSIS — M25551 Pain in right hip: Secondary | ICD-10-CM

## 2021-12-24 ENCOUNTER — Other Ambulatory Visit: Payer: Self-pay

## 2021-12-24 ENCOUNTER — Inpatient Hospital Stay (HOSPITAL_COMMUNITY)
Admission: EM | Admit: 2021-12-24 | Discharge: 2021-12-29 | DRG: 470 | Disposition: A | Discharge status: Rehabilitation Facility | Payer: Medicare Other | Attending: Internal Medicine | Admitting: Internal Medicine

## 2021-12-24 ENCOUNTER — Encounter (HOSPITAL_COMMUNITY): Payer: Self-pay

## 2021-12-24 DIAGNOSIS — N390 Urinary tract infection, site not specified: Secondary | ICD-10-CM | POA: Diagnosis present

## 2021-12-24 DIAGNOSIS — Z90710 Acquired absence of both cervix and uterus: Secondary | ICD-10-CM

## 2021-12-24 DIAGNOSIS — J449 Chronic obstructive pulmonary disease, unspecified: Secondary | ICD-10-CM | POA: Diagnosis present

## 2021-12-24 DIAGNOSIS — Z8674 Personal history of sudden cardiac arrest: Secondary | ICD-10-CM

## 2021-12-24 DIAGNOSIS — Z7902 Long term (current) use of antithrombotics/antiplatelets: Secondary | ICD-10-CM

## 2021-12-24 DIAGNOSIS — Z8261 Family history of arthritis: Secondary | ICD-10-CM

## 2021-12-24 DIAGNOSIS — I10 Essential (primary) hypertension: Secondary | ICD-10-CM | POA: Diagnosis present

## 2021-12-24 DIAGNOSIS — M879 Osteonecrosis, unspecified: Principal | ICD-10-CM | POA: Diagnosis present

## 2021-12-24 DIAGNOSIS — Z79899 Other long term (current) drug therapy: Secondary | ICD-10-CM

## 2021-12-24 DIAGNOSIS — M1611 Unilateral primary osteoarthritis, right hip: Secondary | ICD-10-CM | POA: Diagnosis present

## 2021-12-24 DIAGNOSIS — F319 Bipolar disorder, unspecified: Secondary | ICD-10-CM | POA: Diagnosis present

## 2021-12-24 DIAGNOSIS — Z955 Presence of coronary angioplasty implant and graft: Secondary | ICD-10-CM

## 2021-12-24 DIAGNOSIS — B9620 Unspecified Escherichia coli [E. coli] as the cause of diseases classified elsewhere: Secondary | ICD-10-CM | POA: Diagnosis present

## 2021-12-24 DIAGNOSIS — F419 Anxiety disorder, unspecified: Secondary | ICD-10-CM | POA: Diagnosis present

## 2021-12-24 DIAGNOSIS — Z885 Allergy status to narcotic agent status: Secondary | ICD-10-CM

## 2021-12-24 DIAGNOSIS — Z7982 Long term (current) use of aspirin: Secondary | ICD-10-CM

## 2021-12-24 DIAGNOSIS — M25551 Pain in right hip: Principal | ICD-10-CM | POA: Diagnosis present

## 2021-12-24 DIAGNOSIS — Z8249 Family history of ischemic heart disease and other diseases of the circulatory system: Secondary | ICD-10-CM

## 2021-12-24 DIAGNOSIS — Z20822 Contact with and (suspected) exposure to covid-19: Secondary | ICD-10-CM | POA: Diagnosis present

## 2021-12-24 DIAGNOSIS — K219 Gastro-esophageal reflux disease without esophagitis: Secondary | ICD-10-CM | POA: Diagnosis present

## 2021-12-24 DIAGNOSIS — E782 Mixed hyperlipidemia: Secondary | ICD-10-CM | POA: Diagnosis present

## 2021-12-24 DIAGNOSIS — Z79890 Hormone replacement therapy (postmenopausal): Secondary | ICD-10-CM

## 2021-12-24 DIAGNOSIS — I252 Old myocardial infarction: Secondary | ICD-10-CM

## 2021-12-24 DIAGNOSIS — Z886 Allergy status to analgesic agent status: Secondary | ICD-10-CM

## 2021-12-24 DIAGNOSIS — H9190 Unspecified hearing loss, unspecified ear: Secondary | ICD-10-CM | POA: Diagnosis present

## 2021-12-24 DIAGNOSIS — E039 Hypothyroidism, unspecified: Secondary | ICD-10-CM | POA: Diagnosis present

## 2021-12-24 DIAGNOSIS — F909 Attention-deficit hyperactivity disorder, unspecified type: Secondary | ICD-10-CM | POA: Diagnosis present

## 2021-12-24 DIAGNOSIS — Z981 Arthrodesis status: Secondary | ICD-10-CM

## 2021-12-24 DIAGNOSIS — Z91018 Allergy to other foods: Secondary | ICD-10-CM

## 2021-12-24 DIAGNOSIS — Z888 Allergy status to other drugs, medicaments and biological substances status: Secondary | ICD-10-CM

## 2021-12-24 DIAGNOSIS — I2510 Atherosclerotic heart disease of native coronary artery without angina pectoris: Secondary | ICD-10-CM | POA: Diagnosis present

## 2021-12-24 MED ORDER — OXYCODONE HCL 5 MG PO TABS
5.0000 mg | ORAL_TABLET | Freq: Once | ORAL | Status: AC
  Administered 2021-12-25: 5 mg via ORAL
  Filled 2021-12-24: qty 1

## 2021-12-24 NOTE — ED Provider Triage Note (Signed)
Emergency Medicine Provider Triage Evaluation Note ? ?Deborah [name] , a 71 y.o. female  was evaluated in triage.  Pt complains of known right femur fracture.  Follows with Dewaine Conger and was told to come to the [facility] emergency department because they were on-call and hopefully she will be admitted and taken to the OR tomorrow ? ?Review of Systems  ?Positive: Right leg pain ?Negative: Numbness or tingling ? ?Physical Exam  ?BP (!) 150/72 (BP Location: Right Arm)   Pulse 80   Temp 98.3 ?F (36.8 ?C) (Oral)   Resp 18   Ht 5\' 3"  (1.6 m)   Wt 62.6 kg   SpO2 96%   BMI 24.45 kg/m?  ?Gen:   Awake, no distress   ?Resp:  Normal effort  ?MSK:   Moves extremities without difficulty  ?Other:  Patient moving right lower extremity ? ?Medical Decision Making  ?Medically screening exam initiated at 11:16 PM.  Appropriate orders placed.  was informed that the remainder of the evaluation will be completed by another provider, this initial triage assessment does not replace that evaluation, and the importance of remaining in the ED until their evaluation is complete. ? ? ?IMPRESSION: ?Moderate lateral and mild anterior subluxation of the proximal femur ?with respect to the right acetabulum with high-grade proximal ?femoral erosion and moderate diffuse acetabular erosion. 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 ?  ?No acute fracture is seen. ?  ?Mild left femoroacetabular osteoarthritis. ?  ?Deborah Jester, PA-C ?12/24/21 2318 ? ?

## 2021-12-24 NOTE — ED Provider Notes (Signed)
?[facility]-EMERGENCY DEPT ?Provider Note ? ? ?CSN: [id number] ?Arrival date & time: 12/24/21  2106 ? ?  ? ?History ? ?Chief Complaint  ?Patient presents with  ? Hip Injury  ? ? ?Deborah [name] is a 71 y.o. female. ? ?71 yo F with a cc of R hip pain.  This been going on for a while.  She has been nonweightbearing for about a month now.  She had seen her spinal surgeon in the office who obtained a CT scan and found that she had findings of possible avascular necrosis.  With worsening pain the case was discussed with her orthopedist who suggested she come here for admission and possible hip replacement.  Patient denies cough congestion or fever denies chest pain denies abdominal pain. ? ? ? ?  ? ?Home Medications ?Prior to Admission medications   ?Medication Sig Start Date End Date Taking? Authorizing Provider  ?aspirin EC 81 MG EC tablet Take 1 tablet (81 mg total) by mouth daily. ?Patient taking differently: Take 81 mg by mouth once a week. 06/03/17   Othella Boyer, MD  ?Calcium Carb-Cholecalciferol (CALCIUM + D3 PO) Take 1 tablet by mouth 2 (two) times daily.    [provider]  ?carvedilol (COREG) 3.125 MG tablet TAKE ONE TABLET BY MOUTH TWICE A DAY WITH A MEAL 07/24/21   Baldo Daub, MD  ?cetirizine (ZYRTEC) 10 MG tablet Take 10 mg by mouth daily as needed for allergies.    [provider]  ?clobetasol cream (TEMOVATE) 0.05 % Apply topically. 05/04/20   [provider]  ?clopidogrel (PLAVIX) 75 MG tablet TAKE ONE TABLET BY MOUTH DAILY WITH BREAKFAST 07/24/21   Baldo Daub, MD  ?dextroamphetamine (DEXEDRINE SPANSULE) 15 MG 24 hr capsule Take 15 mg by mouth 2 (two) times daily. MORNING and NOON(TIME)    [provider]  ?diphenhydrAMINE (BENADRYL) 25 MG tablet Take 25 mg by mouth 3 (three) times daily as needed for itching or allergies.    [provider]  ?estradiol (ESTRACE) 0.5 MG tablet Take 0.5 mg by mouth every morning.    [provider]  ?fluticasone (FLONASE) 50 MCG/ACT nasal spray Place 2 sprays into both nostrils daily.    [provider]  ?gabapentin (NEURONTIN) 400 MG capsule Take 400 mg by mouth 4 (four) times daily.    [provider]  ?guaiFENesin (MUCINEX) 600 MG 12 hr tablet Take 600 mg by mouth 2 (two) times daily as needed for to loosen phlegm.    [provider]  ?HYDROcodone-acetaminophen (NORCO) 10-325 MG tablet Take 1 tablet by mouth every 6 (six) hours as needed for moderate pain.    [provider]  ?levothyroxine (SYNTHROID, LEVOTHROID) 75 MCG tablet Take 75 mcg by mouth daily before breakfast.    [provider]  ?Magnesium Oxide 200 MG TABS Take 200 mg by mouth 2 (two) times daily.    [provider]  ?methocarbamol (ROBAXIN) 750 MG tablet Take 375 mg by mouth 6 (six) times daily.    [provider]  ?Multiple Vitamins-Minerals (ONE-A-DAY WOMENS 50+ ADVANTAGE) TABS Take 1 tablet by mouth daily.     [provider]  ?nitroGLYCERIN (NITROSTAT) 0.4 MG SL tablet Place 1 tablet (0.4 mg total) under the tongue every 5 (five) minutes as needed for chest pain. 07/17/20   Baldo Daub, MD  ?PROLIA 60 MG/ML SOSY injection INJECT 1 ML SUBCUTANEOUSLY Q 6 MONTHS 04/26/18   [provider]  ?  rosuvastatin (CRESTOR) 10 MG tablet Take 10 mg by mouth every other day.    [provider]  ?senna (SENOKOT) 8.6 MG TABS tablet Take 17.2 mg by mouth at bedtime.    [provider]  ?sertraline (ZOLOFT) 100 MG tablet Take 100 mg by mouth 2 (two) times daily.    [provider]  ?TURMERIC PO Take 1 capsule by mouth daily.    [provider]  ?valACYclovir (VALTREX) 1000 MG tablet Take 1,000 mg by mouth daily. 05/23/20   [provider]  ?   ? ?Allergies    ?Chocolate, Meloxicam, Demerol [meperidine], and Ibuprofen   ? ?Review of Systems   ?Review of Systems ? ?Physical Exam ?Updated Vital Signs ?BP (!) 137/107    Pulse 70   Temp 98.3 ?F (36.8 ?C) (Oral)   Resp 14   Ht 5\' 3"  (1.6 m)   Wt 62.6 kg   SpO2 99%   BMI 24.45 kg/m?  ?Physical Exam ?Vitals and nursing note reviewed.  ?Constitutional:   ?   General: She is not in acute distress. ?   Appearance: She is well-developed. She is not diaphoretic.  ?HENT:  ?   Head: Normocephalic and atraumatic.  ?Eyes:  ?   Pupils: Pupils are equal, round, and reactive to light.  ?Cardiovascular:  ?   Rate and Rhythm: Normal rate and regular rhythm.  ?   Heart sounds: No murmur heard. ?  No friction rub. No gallop.  ?Pulmonary:  ?   Effort: Pulmonary effort is normal.  ?   Breath sounds: No wheezing or rales.  ?Abdominal:  ?   General: There is no distension.  ?   Palpations: Abdomen is soft.  ?   Tenderness: There is no abdominal tenderness.  ?Musculoskeletal:     ?   General: No tenderness.  ?   Cervical back: Normal range of motion and neck supple.  ?Skin: ?   General: Skin is warm and dry.  ?Neurological:  ?   Mental Status: She is alert and oriented to person, place, and time.  ?Psychiatric:     ?   Behavior: Behavior normal.  ? ? ?ED Results / Procedures / Treatments   ?Labs ?(all labs ordered are listed, but only abnormal results are displayed) ?Labs Reviewed  ?CBC WITH DIFFERENTIAL/PLATELET - Abnormal; Notable for the following components:  ?    Result Value  ? RBC 3.82 (*)   ? MCV 101.3 (*)   ? MCH 34.3 (*)   ? All other components within normal limits  ?COMPREHENSIVE METABOLIC PANEL - Abnormal; Notable for the following components:  ? Glucose, Bld 116 (*)   ? All other components within normal limits  ?RESP PANEL BY RT-PCR (FLU A&B, COVID) [id number]  ?SURGICAL PCR SCREEN  ? ? ?EKG ?EKG Interpretation ? ?Date/Time:  Tuesday December 24 2021 23:55:55 EDT ?Ventricular Rate:  77 ?PR Interval:  139 ?QRS Duration: 87 ?QT Interval:  385 ?QTC Calculation: 436 ?R Axis:   23 ?Text Interpretation: Sinus rhythm Ventricular premature complex Probable left atrial enlargement Anteroseptal  infarct, old No significant change since last tracing Confirmed by [id number] [phone]) on 12/25/2021 12:00:38 AM ? ?Radiology ?CT LUMBAR SPINE WO CONTRAST ? ?Result Date: 12/23/2021 ?CLINICAL DATA:  Right hip pain EXAM: CT LUMBAR SPINE WITHOUT CONTRAST TECHNIQUE: Multidetector CT imaging of the lumbar spine was performed without intravenous contrast administration. Multiplanar CT image reconstructions were also generated. RADIATION DOSE REDUCTION: This exam was performed according to the  departmental dose-optimization program which includes automated exposure control, adjustment of the mA and/or kV according to patient size and/or use of iterative reconstruction technique. COMPARISON:  MR lumbar spine 12/14/2021 FINDINGS: Extensive posterior spinal fusion hardware resulting in beam hardening artifact partially obscuring the adjacent soft tissue and osseous structures. Segmentation: 5 lumbar type vertebrae. Alignment: Minimal grade 1 anterolisthesis of L5 on S1. Vertebrae: No acute fracture or aggressive osseous lesion. Paraspinal and other soft tissues: Negative. Other: None Disc levels: Disc spaces: Posterior lumbar fusion from T12 through S1 with bilateral iliac screws and interbody fusion from L1-2 through L5-S1. no hardware failure or complication. T12-L1: Partial interbody fusion. No definite foraminal or central canal stenosis. L1-L2: Interbody and posterior element fusion. Neural foramen and central canal are obscured by beam hardening artifact. L2-L3: Interbody and posterior element fusion. Neural foramen and central canal are obscured by beam hardening artifact. L3-L4: Interbody and posterior element fusion. Central canal is obscured by beam hardening artifact. No foraminal stenosis. L4-L5: Interbody and posterior element fusion. Central canal is obscured by beam hardening artifact. No foraminal stenosis. L5-S1: Interbody and posterior element fusion. Central canal is obscured by beam hardening artifact. Left  foraminal narrowing. IMPRESSION: 1. Extensive posterior spinal fusion hardware resulting in beam hardening artifact partially obscuring the adjacent soft tissue and osseous structures. No hardware failure or complic

## 2021-12-24 NOTE — ED Triage Notes (Signed)
Pt states that she broke her right hip (6-8 weeks ago) and is to be a direct admit for surgery in the morning.  ?

## 2021-12-25 ENCOUNTER — Observation Stay (HOSPITAL_COMMUNITY): Payer: Medicare Other

## 2021-12-25 ENCOUNTER — Encounter (HOSPITAL_COMMUNITY): Payer: Self-pay | Admitting: Internal Medicine

## 2021-12-25 DIAGNOSIS — M25551 Pain in right hip: Principal | ICD-10-CM

## 2021-12-25 DIAGNOSIS — I2510 Atherosclerotic heart disease of native coronary artery without angina pectoris: Secondary | ICD-10-CM

## 2021-12-25 DIAGNOSIS — E039 Hypothyroidism, unspecified: Secondary | ICD-10-CM

## 2021-12-25 DIAGNOSIS — J449 Chronic obstructive pulmonary disease, unspecified: Secondary | ICD-10-CM | POA: Diagnosis not present

## 2021-12-25 DIAGNOSIS — E782 Mixed hyperlipidemia: Secondary | ICD-10-CM

## 2021-12-25 HISTORY — DX: Pain in right hip: M25.551

## 2021-12-25 LAB — COMPREHENSIVE METABOLIC PANEL
ALT: 21 U/L (ref 0–44)
ALT: 22 U/L (ref 0–44)
AST: 25 U/L (ref 15–41)
AST: 29 U/L (ref 15–41)
Albumin: 3.8 g/dL (ref 3.5–5.0)
Albumin: 4 g/dL (ref 3.5–5.0)
Alkaline Phosphatase: 105 U/L (ref 38–126)
Alkaline Phosphatase: 111 U/L (ref 38–126)
Anion gap: 6 (ref 5–15)
Anion gap: 6 (ref 5–15)
BUN: 15 mg/dL (ref 8–23)
BUN: 18 mg/dL (ref 8–23)
CO2: 28 mmol/L (ref 22–32)
CO2: 29 mmol/L (ref 22–32)
Calcium: 9.2 mg/dL (ref 8.9–10.3)
Calcium: 9.3 mg/dL (ref 8.9–10.3)
Chloride: 102 mmol/L (ref 98–111)
Chloride: 104 mmol/L (ref 98–111)
Creatinine, Ser: 0.5 mg/dL (ref 0.44–1.00)
Creatinine, Ser: 0.56 mg/dL (ref 0.44–1.00)
GFR, Estimated: >60 mL/min (ref >60)
GFR, Estimated: >60 mL/min (ref >60)
Glucose, Bld: 101 mg/dL — ABNORMAL HIGH (ref 70–99)
Glucose, Bld: 116 mg/dL — ABNORMAL HIGH (ref 70–99)
Potassium: 3.6 mmol/L (ref 3.5–5.1)
Potassium: 3.8 mmol/L (ref 3.5–5.1)
Sodium: 137 mmol/L (ref 135–145)
Sodium: 138 mmol/L (ref 135–145)
Total Bilirubin: 0.5 mg/dL (ref 0.3–1.2)
Total Bilirubin: 0.6 mg/dL (ref 0.3–1.2)
Total Protein: 6.7 g/dL (ref 6.5–8.1)
Total Protein: 6.8 g/dL (ref 6.5–8.1)

## 2021-12-25 LAB — URINALYSIS, ROUTINE W REFLEX MICROSCOPIC
Bilirubin Urine: NEGATIVE
Glucose, UA: NEGATIVE mg/dL
Hgb urine dipstick: NEGATIVE
Ketones, ur: NEGATIVE mg/dL
Nitrite: NEGATIVE
Protein, ur: NEGATIVE mg/dL
Specific Gravity, Urine: <1.005 — ABNORMAL LOW (ref 1.005–1.030)
pH: 7.5 (ref 5.0–8.0)

## 2021-12-25 LAB — CBC WITH DIFFERENTIAL/PLATELET
Abs Immature Granulocytes: 0.03 10*3/uL (ref 0.00–0.07)
Abs Immature Granulocytes: 0.03 10*3/uL (ref 0.00–0.07)
Basophils Absolute: 0 10*3/uL (ref 0.0–0.1)
Basophils Absolute: 0.1 10*3/uL (ref 0.0–0.1)
Basophils Relative: 1 %
Basophils Relative: 1 %
Eosinophils Absolute: 0.1 10*3/uL (ref 0.0–0.5)
Eosinophils Absolute: 0.1 10*3/uL (ref 0.0–0.5)
Eosinophils Relative: 2 %
Eosinophils Relative: 2 %
HCT: 38.7 % (ref 36.0–46.0)
HCT: 40.8 % (ref 36.0–46.0)
Hemoglobin: 13.1 g/dL (ref 12.0–15.0)
Hemoglobin: 13.5 g/dL (ref 12.0–15.0)
Immature Granulocytes: 1 %
Immature Granulocytes: 1 %
Lymphocytes Relative: 21 %
Lymphocytes Relative: 23 %
Lymphs Abs: 1.3 10*3/uL (ref 0.7–4.0)
Lymphs Abs: 1.3 10*3/uL (ref 0.7–4.0)
MCH: 33.3 pg (ref 26.0–34.0)
MCH: 34.3 pg — ABNORMAL HIGH (ref 26.0–34.0)
MCHC: 33.1 g/dL (ref 30.0–36.0)
MCHC: 33.9 g/dL (ref 30.0–36.0)
MCV: 100.5 fL — ABNORMAL HIGH (ref 80.0–100.0)
MCV: 101.3 fL — ABNORMAL HIGH (ref 80.0–100.0)
Monocytes Absolute: 0.6 10*3/uL (ref 0.1–1.0)
Monocytes Absolute: 0.7 10*3/uL (ref 0.1–1.0)
Monocytes Relative: 11 %
Monocytes Relative: 11 %
Neutro Abs: 3.7 10*3/uL (ref 1.7–7.7)
Neutro Abs: 4.1 10*3/uL (ref 1.7–7.7)
Neutrophils Relative %: 62 %
Neutrophils Relative %: 64 %
Platelets: 266 10*3/uL (ref 150–400)
Platelets: 282 10*3/uL (ref 150–400)
RBC: 3.82 MIL/uL — ABNORMAL LOW (ref 3.87–5.11)
RBC: 4.06 MIL/uL (ref 3.87–5.11)
RDW: 12.7 % (ref 11.5–15.5)
RDW: 12.8 % (ref 11.5–15.5)
WBC: 5.8 10*3/uL (ref 4.0–10.5)
WBC: 6.2 10*3/uL (ref 4.0–10.5)
nRBC: 0 % (ref 0.0–0.2)
nRBC: 0 % (ref 0.0–0.2)

## 2021-12-25 LAB — SURGICAL PCR SCREEN
MRSA, PCR: NEGATIVE
Staphylococcus aureus: POSITIVE — AB

## 2021-12-25 LAB — SYNOVIAL CELL COUNT + DIFF, W/ CRYSTALS
Crystals, Fluid: NONE SEEN
WBC, Synovial: 2 /mm3 (ref 0–200)

## 2021-12-25 LAB — PROTIME-INR
INR: 1 (ref 0.8–1.2)
Prothrombin Time: 13.1 seconds (ref 11.4–15.2)

## 2021-12-25 LAB — RESP PANEL BY RT-PCR (FLU A&B, COVID) ARPGX2
Influenza A by PCR: NEGATIVE
Influenza B by PCR: NEGATIVE
SARS Coronavirus 2 by RT PCR: NEGATIVE

## 2021-12-25 LAB — URINALYSIS, MICROSCOPIC (REFLEX): RBC / HPF: NONE SEEN RBC/hpf (ref 0–5)

## 2021-12-25 LAB — C-REACTIVE PROTEIN: CRP: 0.6 mg/dL (ref <1.0)

## 2021-12-25 LAB — APTT: aPTT: 25 seconds (ref 24–36)

## 2021-12-25 LAB — SEDIMENTATION RATE: Sed Rate: 15 mm/hr (ref 0–22)

## 2021-12-25 MED ORDER — ENOXAPARIN SODIUM 40 MG/0.4ML IJ SOSY
40.0000 mg | PREFILLED_SYRINGE | INTRAMUSCULAR | Status: DC
  Filled 2021-12-25: qty 0.4

## 2021-12-25 MED ORDER — CLOPIDOGREL BISULFATE 75 MG PO TABS: 75.0000 mg | ORAL_TABLET | Freq: Every day | ORAL | Status: DC

## 2021-12-25 MED ORDER — SENNOSIDES-DOCUSATE SODIUM 8.6-50 MG PO TABS
1.0000 | ORAL_TABLET | Freq: Two times a day (BID) | ORAL | Status: DC
  Administered 2021-12-25 – 2021-12-29 (×7): 1 via ORAL
  Filled 2021-12-25 (×7): qty 1

## 2021-12-25 MED ORDER — SERTRALINE HCL 100 MG PO TABS
100.0000 mg | ORAL_TABLET | Freq: Every day | ORAL | Status: DC
  Administered 2021-12-25 – 2021-12-28 (×4): 100 mg via ORAL
  Filled 2021-12-25 (×4): qty 1

## 2021-12-25 MED ORDER — SENNA 8.6 MG PO TABS: 17.2000 mg | ORAL_TABLET | Freq: Every day | ORAL | Status: DC

## 2021-12-25 MED ORDER — POLYETHYLENE GLYCOL 3350 17 G PO PACK: 17.0000 g | PACK | Freq: Every day | ORAL | Status: DC | PRN

## 2021-12-25 MED ORDER — METHOCARBAMOL 500 MG PO TABS
500.0000 mg | ORAL_TABLET | Freq: Four times a day (QID) | ORAL | Status: DC
  Administered 2021-12-25 – 2021-12-29 (×14): 500 mg via ORAL
  Filled 2021-12-25 (×15): qty 1

## 2021-12-25 MED ORDER — IPRATROPIUM-ALBUTEROL 0.5-2.5 (3) MG/3ML IN SOLN
3.0000 mL | RESPIRATORY_TRACT | Status: DC | PRN
Start: 2021-12-25 — End: 2021-12-29

## 2021-12-25 MED ORDER — HYDROCODONE-ACETAMINOPHEN 5-325 MG PO TABS
1.0000 | ORAL_TABLET | ORAL | Status: DC | PRN
  Administered 2021-12-25: 1 via ORAL
  Filled 2021-12-25: qty 1

## 2021-12-25 MED ORDER — AMPHETAMINE-DEXTROAMPHET ER 5 MG PO CP24
25.0000 mg | ORAL_CAPSULE | Freq: Every morning | ORAL | Status: DC
  Filled 2021-12-25: qty 2

## 2021-12-25 MED ORDER — VALACYCLOVIR HCL 500 MG PO TABS
1000.0000 mg | ORAL_TABLET | Freq: Every day | ORAL | Status: DC
  Administered 2021-12-25 – 2021-12-29 (×4): 1000 mg via ORAL
  Filled 2021-12-25 (×5): qty 2

## 2021-12-25 MED ORDER — FLUTICASONE PROPIONATE 50 MCG/ACT NA SUSP
2.0000 | Freq: Every day | NASAL | Status: DC
  Administered 2021-12-25 – 2021-12-29 (×5): 2 via NASAL
  Filled 2021-12-25: qty 16

## 2021-12-25 MED ORDER — ONDANSETRON HCL 4 MG/2ML IJ SOLN: 4.0000 mg | Freq: Four times a day (QID) | INTRAMUSCULAR | Status: DC | PRN

## 2021-12-25 MED ORDER — CHLORHEXIDINE GLUCONATE CLOTH 2 % EX PADS
6.0000 | MEDICATED_PAD | Freq: Every day | CUTANEOUS | Status: DC
  Administered 2021-12-26 – 2021-12-29 (×4): 6 via TOPICAL

## 2021-12-25 MED ORDER — MORPHINE SULFATE (PF) 2 MG/ML IV SOLN
2.0000 mg | INTRAVENOUS | Status: DC | PRN
  Administered 2021-12-25 – 2021-12-27 (×3): 2 mg via INTRAVENOUS
  Filled 2021-12-25 (×4): qty 1

## 2021-12-25 MED ORDER — LORATADINE 10 MG PO TABS
10.0000 mg | ORAL_TABLET | Freq: Every day | ORAL | Status: DC | PRN
  Administered 2021-12-25 – 2021-12-28 (×3): 10 mg via ORAL
  Filled 2021-12-25 (×3): qty 1

## 2021-12-25 MED ORDER — ROSUVASTATIN CALCIUM 5 MG PO TABS
10.0000 mg | ORAL_TABLET | ORAL | Status: DC
Start: 2021-12-25 — End: 2021-12-29
  Administered 2021-12-25 – 2021-12-27 (×2): 10 mg via ORAL
  Filled 2021-12-25: qty 2
  Filled 2021-12-25 (×2): qty 1

## 2021-12-25 MED ORDER — LEVOTHYROXINE SODIUM 75 MCG PO TABS
75.0000 ug | ORAL_TABLET | Freq: Every day | ORAL | Status: DC
  Administered 2021-12-25 – 2021-12-29 (×4): 75 ug via ORAL
  Filled 2021-12-25 (×5): qty 1

## 2021-12-25 MED ORDER — SENNOSIDES-DOCUSATE SODIUM 8.6-50 MG PO TABS
2.0000 | ORAL_TABLET | Freq: Two times a day (BID) | ORAL | Status: DC
Start: 2021-12-25 — End: 2021-12-25
  Filled 2021-12-25: qty 2

## 2021-12-25 MED ORDER — NITROGLYCERIN 0.4 MG SL SUBL: 0.4000 mg | SUBLINGUAL_TABLET | SUBLINGUAL | Status: DC | PRN

## 2021-12-25 MED ORDER — ACETAMINOPHEN 650 MG RE SUPP: 650.0000 mg | Freq: Four times a day (QID) | RECTAL | Status: DC | PRN

## 2021-12-25 MED ORDER — ONDANSETRON HCL 4 MG PO TABS: 4.0000 mg | ORAL_TABLET | Freq: Four times a day (QID) | ORAL | Status: DC | PRN

## 2021-12-25 MED ORDER — OXYCODONE-ACETAMINOPHEN 5-325 MG PO TABS
1.0000 | ORAL_TABLET | ORAL | Status: DC | PRN
  Administered 2021-12-25 – 2021-12-28 (×9): 1 via ORAL
  Filled 2021-12-25 (×9): qty 1

## 2021-12-25 MED ORDER — CARVEDILOL 3.125 MG PO TABS
3.1250 mg | ORAL_TABLET | Freq: Two times a day (BID) | ORAL | Status: DC
  Administered 2021-12-25 – 2021-12-26 (×3): 3.125 mg via ORAL
  Filled 2021-12-25 (×3): qty 1

## 2021-12-25 MED ORDER — GABAPENTIN 400 MG PO CAPS
400.0000 mg | ORAL_CAPSULE | Freq: Four times a day (QID) | ORAL | Status: DC
  Administered 2021-12-25 – 2021-12-29 (×14): 400 mg via ORAL
  Filled 2021-12-25 (×14): qty 1

## 2021-12-25 MED ORDER — ACETAMINOPHEN 325 MG PO TABS
650.0000 mg | ORAL_TABLET | Freq: Four times a day (QID) | ORAL | Status: DC | PRN
  Administered 2021-12-26: 650 mg via ORAL
  Filled 2021-12-25: qty 2

## 2021-12-25 MED ORDER — SERTRALINE HCL 100 MG PO TABS
100.0000 mg | ORAL_TABLET | Freq: Two times a day (BID) | ORAL | Status: DC
Start: 2021-12-25 — End: 2021-12-25

## 2021-12-25 MED ORDER — LACTATED RINGERS IV SOLN
INTRAVENOUS | Status: AC
Start: 2021-12-25 — End: 2021-12-25

## 2021-12-25 MED ORDER — MUPIROCIN 2 % EX OINT
1.0000 "application " | TOPICAL_OINTMENT | Freq: Two times a day (BID) | CUTANEOUS | Status: DC
  Administered 2021-12-25 – 2021-12-29 (×9): 1 via NASAL
  Filled 2021-12-25 (×3): qty 22

## 2021-12-25 MED ORDER — ESTRADIOL 1 MG PO TABS
0.5000 mg | ORAL_TABLET | Freq: Every morning | ORAL | Status: DC
  Administered 2021-12-25 – 2021-12-29 (×4): 0.5 mg via ORAL
  Filled 2021-12-25 (×5): qty 0.5
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
## 2021-12-25 NOTE — Assessment & Plan Note (Signed)
.   Resume home regimen of Synthroid 

## 2021-12-25 NOTE — Progress Notes (Addendum)
Patient admitted early this morning, detail please see HPI ?Her vital signs are stable, she is to have IR drain to right hip today, hold anticoagulation. ?Urine appears cloudy, will get UA and urine culture. ?Daughter at bedside, questions answered ?

## 2021-12-25 NOTE — Progress Notes (Signed)
?  Transition of Care (TOC) Screening Note ? ? ?Patient Details  ?Name: Deborah [name] ?Date of Birth: [birth year] ? ? ?Transition of Care [facility]) CM/SW Contact:    ?Lanier Clam, RN ?Phone Number: ?12/25/2021, 1:40 PM ? ? ? ?Transition of Care Department [facility]) has reviewed patient and no TOC needs have been identified at this time. We will continue to monitor patient advancement through interdisciplinary progression rounds. If new patient transition needs arise, please place a TOC consult. ?  ?

## 2021-12-25 NOTE — Care Management Obs Status (Signed)
MEDICARE OBSERVATION STATUS NOTIFICATION ? ? ?Patient Details  ?Name: Deborah [name] ?MRN: [id number] ?Date of Birth: 1950/11/10 ? ? ?Medicare Observation Status Notification Given:  Yes ? ? ? ?Lanier Clam, RN ?12/25/2021, 1:39 PM ?

## 2021-12-25 NOTE — Procedures (Signed)
Technically successful US guided aspiration of right hip joint. ?Sample sent for labs as ordered. ?No complications. ?See full report for details. ? ?.kbsig ?

## 2021-12-25 NOTE — ED Notes (Signed)
Pt placed on purewick 

## 2021-12-25 NOTE — Assessment & Plan Note (Deleted)
Continuing home regimen of daily PPI therapy.  
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
## 2021-12-25 NOTE — Assessment & Plan Note (Signed)
?   Patient is currently chest pain free ?? Monitoring patient on telemetry ?? Holding home regimen of Plavix temporarily  ?? Continuing lipid lowering therapy and AV nodal blocking therapy ? ?

## 2021-12-25 NOTE — Assessment & Plan Note (Signed)
.   Continuing home regimen of lipid lowering therapy.  

## 2021-12-25 NOTE — Assessment & Plan Note (Signed)
   No evidence of COPD exacerbation this time  Continue home regimen of maintenance inhalers  As needed bronchodilator therapy for episodic shortness of breath and wheezing.  

## 2021-12-25 NOTE — Assessment & Plan Note (Addendum)
?   Patient presenting with long standing right hip pain, following with Dr. Jerrye Bushy in setting ?? Patient sent to the hospital due to rapidly progressive deterioration of the right hip joint of unclear etiology with evidence of femoral erosion and acetabular erosion..  ?? Orthopedic surgery is concerned for possible infectious process within the right hip, requesting hospitalist admission for work-up and IR consultation for joint aspiration. ?? ER provider discussed plan of care with Dr. Mardelle Matte to confirm plan ?? IR consultation order placed for joint aspiration and culture ?? Orthopedic surgery additionally will follow along in consultation, their input is appreciated. ?? Avoiding venous antibiotics for now until an infection is confirmed via joint aspiration. ?? Patient will be kept n.p.o. for now and placed on gentle intravenous hydration ?? Opiate-based analgesics for substantial pain in the meantime ?? Blood cultures, inflammatory markers ordered ?

## 2021-12-26 DIAGNOSIS — I252 Old myocardial infarction: Secondary | ICD-10-CM | POA: Diagnosis not present

## 2021-12-26 DIAGNOSIS — Z8249 Family history of ischemic heart disease and other diseases of the circulatory system: Secondary | ICD-10-CM | POA: Diagnosis not present

## 2021-12-26 DIAGNOSIS — Z886 Allergy status to analgesic agent status: Secondary | ICD-10-CM | POA: Diagnosis not present

## 2021-12-26 DIAGNOSIS — K219 Gastro-esophageal reflux disease without esophagitis: Secondary | ICD-10-CM | POA: Diagnosis present

## 2021-12-26 DIAGNOSIS — E782 Mixed hyperlipidemia: Secondary | ICD-10-CM | POA: Diagnosis present

## 2021-12-26 DIAGNOSIS — Z8674 Personal history of sudden cardiac arrest: Secondary | ICD-10-CM | POA: Diagnosis not present

## 2021-12-26 DIAGNOSIS — H9190 Unspecified hearing loss, unspecified ear: Secondary | ICD-10-CM | POA: Diagnosis present

## 2021-12-26 DIAGNOSIS — B9620 Unspecified Escherichia coli [E. coli] as the cause of diseases classified elsewhere: Secondary | ICD-10-CM | POA: Diagnosis present

## 2021-12-26 DIAGNOSIS — Z20822 Contact with and (suspected) exposure to covid-19: Secondary | ICD-10-CM | POA: Diagnosis present

## 2021-12-26 DIAGNOSIS — I10 Essential (primary) hypertension: Secondary | ICD-10-CM | POA: Diagnosis present

## 2021-12-26 DIAGNOSIS — Z955 Presence of coronary angioplasty implant and graft: Secondary | ICD-10-CM | POA: Diagnosis not present

## 2021-12-26 DIAGNOSIS — R5381 Other malaise: Secondary | ICD-10-CM | POA: Diagnosis not present

## 2021-12-26 DIAGNOSIS — I2510 Atherosclerotic heart disease of native coronary artery without angina pectoris: Secondary | ICD-10-CM | POA: Diagnosis present

## 2021-12-26 DIAGNOSIS — Z8261 Family history of arthritis: Secondary | ICD-10-CM | POA: Diagnosis not present

## 2021-12-26 DIAGNOSIS — M1611 Unilateral primary osteoarthritis, right hip: Secondary | ICD-10-CM | POA: Diagnosis present

## 2021-12-26 DIAGNOSIS — Z471 Aftercare following joint replacement surgery: Secondary | ICD-10-CM | POA: Diagnosis not present

## 2021-12-26 DIAGNOSIS — Z90710 Acquired absence of both cervix and uterus: Secondary | ICD-10-CM | POA: Diagnosis not present

## 2021-12-26 DIAGNOSIS — M25551 Pain in right hip: Secondary | ICD-10-CM | POA: Diagnosis present

## 2021-12-26 DIAGNOSIS — J449 Chronic obstructive pulmonary disease, unspecified: Secondary | ICD-10-CM | POA: Diagnosis present

## 2021-12-26 DIAGNOSIS — F319 Bipolar disorder, unspecified: Secondary | ICD-10-CM | POA: Diagnosis present

## 2021-12-26 DIAGNOSIS — E039 Hypothyroidism, unspecified: Secondary | ICD-10-CM | POA: Diagnosis present

## 2021-12-26 DIAGNOSIS — M879 Osteonecrosis, unspecified: Secondary | ICD-10-CM | POA: Diagnosis present

## 2021-12-26 DIAGNOSIS — Z885 Allergy status to narcotic agent status: Secondary | ICD-10-CM | POA: Diagnosis not present

## 2021-12-26 DIAGNOSIS — Z981 Arthrodesis status: Secondary | ICD-10-CM | POA: Diagnosis not present

## 2021-12-26 DIAGNOSIS — M87051 Idiopathic aseptic necrosis of right femur: Secondary | ICD-10-CM | POA: Diagnosis not present

## 2021-12-26 DIAGNOSIS — N390 Urinary tract infection, site not specified: Secondary | ICD-10-CM | POA: Diagnosis present

## 2021-12-26 DIAGNOSIS — I4891 Unspecified atrial fibrillation: Secondary | ICD-10-CM | POA: Diagnosis not present

## 2021-12-26 DIAGNOSIS — F419 Anxiety disorder, unspecified: Secondary | ICD-10-CM | POA: Diagnosis present

## 2021-12-26 DIAGNOSIS — F909 Attention-deficit hyperactivity disorder, unspecified type: Secondary | ICD-10-CM | POA: Diagnosis present

## 2021-12-26 LAB — BASIC METABOLIC PANEL
Anion gap: 7 (ref 5–15)
BUN: 11 mg/dL (ref 8–23)
CO2: 26 mmol/L (ref 22–32)
Calcium: 9.4 mg/dL (ref 8.9–10.3)
Chloride: 105 mmol/L (ref 98–111)
Creatinine, Ser: 0.51 mg/dL (ref 0.44–1.00)
GFR, Estimated: >60 mL/min (ref >60)
Glucose, Bld: 100 mg/dL — ABNORMAL HIGH (ref 70–99)
Potassium: 3.6 mmol/L (ref 3.5–5.1)
Sodium: 138 mmol/L (ref 135–145)

## 2021-12-26 LAB — CBC
HCT: 42.3 % (ref 36.0–46.0)
Hemoglobin: 14.4 g/dL (ref 12.0–15.0)
MCH: 33.9 pg (ref 26.0–34.0)
MCHC: 34 g/dL (ref 30.0–36.0)
MCV: 99.5 fL (ref 80.0–100.0)
Platelets: 263 10*3/uL (ref 150–400)
RBC: 4.25 MIL/uL (ref 3.87–5.11)
RDW: 12.8 % (ref 11.5–15.5)
WBC: 6.3 10*3/uL (ref 4.0–10.5)
nRBC: 0 % (ref 0.0–0.2)

## 2021-12-26 LAB — MAGNESIUM: Magnesium: 2.3 mg/dL (ref 1.7–2.4)

## 2021-12-26 MED ORDER — SODIUM CHLORIDE 0.9 % IV SOLN
1.0000 g | Freq: Every day | INTRAVENOUS | Status: DC
  Administered 2021-12-26 – 2021-12-27 (×2): 1 g via INTRAVENOUS
  Filled 2021-12-26 (×3): qty 10

## 2021-12-26 MED ORDER — ENOXAPARIN SODIUM 40 MG/0.4ML IJ SOSY
40.0000 mg | PREFILLED_SYRINGE | INTRAMUSCULAR | Status: AC
  Administered 2021-12-26: 40 mg via SUBCUTANEOUS

## 2021-12-26 MED ORDER — AMPHETAMINE-DEXTROAMPHET ER 5 MG PO CP24: 25.0000 mg | ORAL_CAPSULE | Freq: Every day | ORAL | Status: DC

## 2021-12-26 MED ORDER — AMPHETAMINE-DEXTROAMPHET ER 10 MG PO CP24
20.0000 mg | ORAL_CAPSULE | Freq: Every day | ORAL | Status: DC
  Administered 2021-12-26 – 2021-12-29 (×3): 20 mg via ORAL
  Filled 2021-12-26 (×3): qty 2

## 2021-12-26 MED ORDER — CARVEDILOL 6.25 MG PO TABS
6.2500 mg | ORAL_TABLET | Freq: Two times a day (BID) | ORAL | Status: DC
  Administered 2021-12-26 – 2021-12-29 (×6): 6.25 mg via ORAL
  Filled 2021-12-26 (×6): qty 1

## 2021-12-26 MED ORDER — AMPHETAMINE-DEXTROAMPHET ER 5 MG PO CP24
5.0000 mg | ORAL_CAPSULE | Freq: Every day | ORAL | Status: DC
  Administered 2021-12-26 – 2021-12-29 (×3): 5 mg via ORAL
  Filled 2021-12-26 (×3): qty 1

## 2021-12-26 MED ORDER — HYDRALAZINE HCL 10 MG PO TABS: 10.0000 mg | ORAL_TABLET | Freq: Three times a day (TID) | ORAL | Status: DC | PRN

## 2021-12-26 NOTE — Progress Notes (Addendum)
?PROGRESS NOTE ? ? ? ?Deborah [name]  WSF:[id number] DOB: Sep 18, 1950 DOA: 12/24/2021 ?PCP: Rogers Blocker, MD  ? ? ? ?Brief Narrative:  ?71 year old female with past medical history of coronary artery disease (anterior MI  with Vfib arrest 2006 with taxus sent to LAD, 2018 restenosis ), COPD, gastroesophageal reflux disease, hypothyroidism presenting to [facility] emergency department with complaints of right hip pain at the direction of Dr. Mardelle Matte with orthopedic surgery. ? ? ?Subjective: ? ?Blood pressure is elevated,  ?Pain at 2 currenly, having bm, no fever ? ?Daughter is at bedside  ? ?Assessment & Plan: ? Principal Problem: ?  Right hip pain ?Active Problems: ?  Coronary artery disease involving native coronary artery of native heart without angina pectoris ?  COPD (chronic obstructive pulmonary disease) ([place]) ?  Mixed hyperlipidemia ?  Hypothyroidism ? ? ? ?Assessment and Plan: ? 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 ?UTI ?Urine noticed to be cloudy ?UA with mild leuk  ?Urine culture > 100,000 ecoli, final result pending, empiric start Rocephin for now, follow-up on final urine culture result ? ?Hypertension ?Blood pressure elevated , likely from pain and stress ?Increase Coreg from 3.125 to 6.25 for now, expect blood pressure improvement after surgery, likely will need to decrease Coreg accordingly post op ? ?Hyperlipidemia ?Continue statin ? ?Coronary artery disease involving native coronary artery of native  heart without angina pectoris ?She had acute anterior MI with VF in 2006 with PCI and Taxus stent mid LAD. Subsequently in 2018 PCI of in-stent restenosis of LAD ?Patient is currently chest pain free ?Holding home regimen of asa/ Plavix temporarily , resume when ok with ortho ?Continuing statin and coreg ? ? ? ?COPD (chronic obstructive pulmonary disease) ([place]) ?Stable, no cough, no wheezing, on room air ?Continue home regimen ? ? ? ?Hypothyroidism ?continue home regimen of Synthroid ? ? ? ? ?I have Reviewed nursing notes, Vitals, pain scores, I/o's, Lab results and  imaging results since pt's last encounter, details please see discussion above  ?I ordered the following labs:  ?Unresulted Labs (From admission, onward)  ? ?  Start     Ordered  ? 12/27/21 0500  CBC with Differential/Platelet  Tomorrow morning,   R       ? 12/26/21 1648  ? 12/27/21 0174  Basic metabolic panel  Tomorrow morning,   R       ? 12/26/21 1648  ? ?  ?  ? ?  ? ? ? ?DVT prophylaxis: Place and maintain sequential compression device Start: 12/26/21 1615 ? ? ?Code Status:   Code Status: Full Code ? ?Family Communication: daughter  ?Disposition:  ? ? ?Dispo: The patient is from: home ?             Anticipated d/c is to: TBD ?             Anticipated d/c date is: TBD ? ? ? ? ? ? ?Objective: ?Vitals:  ? 12/25/21 2000 12/26/21 0630 12/26/21 0646 12/26/21 1207  ?BP: Marland Kitchen)  145/82 (!) 183/97 (!) 182/90 (!) 142/79  ?Pulse: 82 79  98  ?Resp: 16 16    ?Temp: 98.5 ?F (36.9 ?C) 97.7 ?F (36.5 ?C)  97.9 ?F (36.6 ?C)  ?TempSrc: Oral Oral  Oral  ?SpO2: 96% 96%  99%  ?Weight:      ?Height:      ? ? ?Intake/Output Summary (Last 24 hours) at 12/26/2021 1648 ?Last data filed at 12/26/2021 1312 ?Gross per 24 hour  ?Intake 1027.55 ml  ?Output 1275 ml  ?Net -247.45 ml  ? ?Filed Weights  ? 12/24/21 2253  ?Weight: 62.6 kg  ? ? ?Examination: ? ?General exam: alert, awake, communicative,calm, NAD ?Respiratory system: Clear to auscultation. Respiratory effort  normal. ?Cardiovascular system:  RRR.  ?Gastrointestinal system: Abdomen is nondistended, soft and nontender.  Normal bowel sounds heard. ?Central nervous system: Alert and oriented. No focal neurological deficits. ?Extremities:  no edema, right hip limited range of motion ?Skin: No rashes, lesions or ulcers ?Psychiatry: Judgement and insight appear normal. Mood & affect appropriate.  ? ? ? ?Data Reviewed: I have personally reviewed  labs and visualized  imaging studies since the last encounter and formulate the plan  ? ? ? ? ? ? ?Scheduled Meds: ? amphetamine-dextroamphetamine  20 mg Oral Daily  ? And  ? amphetamine-dextroamphetamine  5 mg Oral Daily  ? carvedilol  6.25 mg Oral BID WC  ? Chlorhexidine Gluconate Cloth  6 each Topical Q0600  ? estradiol  0.5 mg Oral q morning  ? fluticasone  2 spray Each Nare Daily  ? gabapentin  400 mg Oral QID  ? levothyroxine  75 mcg Oral Q0600  ? methocarbamol  500 mg Oral QID  ? mupirocin ointment  1 application. Nasal BID  ? rosuvastatin  10 mg Oral QODAY  ? senna-docusate  1 tablet Oral BID  ? sertraline  100 mg Oral Daily  ? valACYclovir  1,000 mg Oral Daily  ? ?Continuous Infusions: ? cefTRIAXone (ROCEPHIN)  IV    ? ? ? LOS: 0 days  ? ? ? ?Florencia Reasons, MD PhD FACP ?[facility] ? ?Available via Epic secure chat 7am-7pm for nonurgent issues ?Please page for urgent issues ?To page the attending provider between 7A-7P or the covering provider during after hours 7P-7A, please log into the web site [email or web address] and access using universal [place] password for that web site. If you do not have the password, please call the hospital operator. ? ? ? ?12/26/2021, 4:48 PM  ? ? ?

## 2021-12-26 NOTE — Progress Notes (Signed)
Patient to transfer to [facility] today for Surgery tomorrow. Report called to [address] [place] [facility] RN accepting report. Pt and family updated    ?

## 2021-12-26 NOTE — Progress Notes (Signed)
Patient received from [facility].  Patient is alert and oriented x 4. Right hip/RLE with limited mobility, with good sensation.  Assisted in position of comfort.  Oriented to room and unit routine, needs addressed. ?

## 2021-12-26 NOTE — Progress Notes (Signed)
? ? ? ?Subjective: ? ?Patient reports pain as well controlled this morning.  She denies any new pain.  She denies any distal numbness and tingling.  Patient underwent right hip aspiration by radiology yesterday.  Per procedure note only obtained 1 cc of bloody fluid from the hip joint.  Her daughter is at bedside.  Questions regarding plan for surgery were answered. ? ?Objective:  ? ?VITALS:   ?Vitals:  ? 12/25/21 1646 12/25/21 2000 12/26/21 0630 12/26/21 0646  ?BP:  (!) 145/82 (!) 183/97 (!) 182/90  ?Pulse: 86 82 79   ?Resp:  16 16   ?Temp:  98.5 ?F (36.9 ?C) 97.7 ?F (36.5 ?C)   ?TempSrc:  Oral Oral   ?SpO2:  96% 96%   ?Weight:      ?Height:      ? ? ?Sensation intact distally ?Intact pulses distally ?Dorsiflexion/Plantar flexion intact ?No cellulitis present ?Compartment soft ? ? ?Lab Results  ?Component Value Date  ? WBC 6.3 12/26/2021  ? HGB 14.4 12/26/2021  ? HCT 42.3 12/26/2021  ? MCV 99.5 12/26/2021  ? PLT 263 12/26/2021  ? ?BMET ?   ?Component Value Date/Time  ? NA 138 12/26/2021 0409  ? NA 135 09/25/2021 1206  ? K 3.6 12/26/2021 0409  ? CL 105 12/26/2021 0409  ? CO2 26 12/26/2021 0409  ? GLUCOSE 100 (H) 12/26/2021 0409  ? BUN 11 12/26/2021 0409  ? BUN 14 09/25/2021 1206  ? CREATININE 0.51 12/26/2021 0409  ? CALCIUM 9.4 12/26/2021 0409  ? EGFR 92 09/25/2021 1206  ? GFRNONAA >60 12/26/2021 0409  ? ?Xray: Pelvis right hip demonstrate severe osteonecrosis and femoral head collapse.  Moderate degenerative acetabular changes. ? ?Assessment/Plan: ?   ? ?Principal Problem: ?  Right hip pain ?Active Problems: ?  Coronary artery disease involving native coronary artery of native heart without angina pectoris ?  Hypothyroidism ?  Mixed hyperlipidemia ?  COPD (chronic obstructive pulmonary disease) ([place]) ? 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 ? ?Work-up for infection thus far has been negative.  Inflammatory marker versus CRP, ESR, white count have all been normal.  Patient has been afebrile.   Aspiration only demonstrated 1 cc of bloody fluid.  Insufficient for cell count.  We will follow-up culture results today.  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 ? ?Discussed concerns regarding iliac screw with my joint replacement colleagues as well as reached out to Dr. Ronnald Ramp, the spine surgeon she has followed with in town.  To address the screw we will have carbide metal cutting burrs to burr down the screw tip which we believe is likely titanium.  We will also have cement double acetabular liners available if concerns regarding screw prominence or bone quality that we will not be able to get good fixation with a press-fit cup.  If still concerns regarding acetabular fixation due to the screw, Dr. Ronnald Ramp will be available at [facility] tomorrow where we will be doing her surgery to be able to remove the screw posteriorly. ? ?We will plan to take intraoperative cultures as well.  If significant concern for gross purulence will send Intra-Op frozen specimens, and have a spacer option available if needed. Discussed that if spacer is placed patient will need a second surgery to be converted to a total hip in about 2-3 months once we feel the infection has cleared. ? ?Patient and daughter express understanding and agreement with proceeding with surgery  tomorrow. ? ? ?Chrishawn Boley A Shevawn Langenberg ?12/26/2021, 8:03 AM ? ? ?Charlies Constable, MD ? ?Contact information:   ?Weekdays 7am-5pm epic message Dr. Zachery Dakins, or call office for patient follow up: [phone] ?After hours and holidays please check [email or web address] for group call information for [facility] ? ? ?

## 2021-12-27 ENCOUNTER — Inpatient Hospital Stay (HOSPITAL_COMMUNITY): Payer: Medicare Other | Admitting: Anesthesiology

## 2021-12-27 ENCOUNTER — Inpatient Hospital Stay (HOSPITAL_COMMUNITY): Payer: Medicare Other

## 2021-12-27 ENCOUNTER — Encounter (HOSPITAL_COMMUNITY): Payer: Self-pay | Admitting: Internal Medicine

## 2021-12-27 ENCOUNTER — Encounter (HOSPITAL_COMMUNITY)
Admission: EM | Disposition: A | Discharge status: Other Acute Inpatient Hospital | Payer: Self-pay | Source: Home / Self Care | Attending: Internal Medicine

## 2021-12-27 ENCOUNTER — Other Ambulatory Visit: Payer: Self-pay

## 2021-12-27 DIAGNOSIS — I252 Old myocardial infarction: Secondary | ICD-10-CM

## 2021-12-27 DIAGNOSIS — I2510 Atherosclerotic heart disease of native coronary artery without angina pectoris: Secondary | ICD-10-CM

## 2021-12-27 DIAGNOSIS — E039 Hypothyroidism, unspecified: Secondary | ICD-10-CM

## 2021-12-27 DIAGNOSIS — M25551 Pain in right hip: Secondary | ICD-10-CM | POA: Diagnosis not present

## 2021-12-27 DIAGNOSIS — M879 Osteonecrosis, unspecified: Secondary | ICD-10-CM

## 2021-12-27 HISTORY — PX: TOTAL HIP ARTHROPLASTY: SHX124

## 2021-12-27 LAB — URINE CULTURE: Culture: >=100000 — AB

## 2021-12-27 LAB — BASIC METABOLIC PANEL
Anion gap: 8 (ref 5–15)
BUN: 15 mg/dL (ref 8–23)
CO2: 23 mmol/L (ref 22–32)
Calcium: 9 mg/dL (ref 8.9–10.3)
Chloride: 104 mmol/L (ref 98–111)
Creatinine, Ser: 0.55 mg/dL (ref 0.44–1.00)
GFR, Estimated: >60 mL/min (ref >60)
Glucose, Bld: 116 mg/dL — ABNORMAL HIGH (ref 70–99)
Potassium: 3.9 mmol/L (ref 3.5–5.1)
Sodium: 135 mmol/L (ref 135–145)

## 2021-12-27 LAB — CBC WITH DIFFERENTIAL/PLATELET
Abs Immature Granulocytes: 0.02 10*3/uL (ref 0.00–0.07)
Basophils Absolute: 0.1 10*3/uL (ref 0.0–0.1)
Basophils Relative: 1 %
Eosinophils Absolute: 0.1 10*3/uL (ref 0.0–0.5)
Eosinophils Relative: 1 %
HCT: 41.3 % (ref 36.0–46.0)
Hemoglobin: 13.4 g/dL (ref 12.0–15.0)
Immature Granulocytes: 0 %
Lymphocytes Relative: 18 %
Lymphs Abs: 1.2 10*3/uL (ref 0.7–4.0)
MCH: 32.5 pg (ref 26.0–34.0)
MCHC: 32.4 g/dL (ref 30.0–36.0)
MCV: 100.2 fL — ABNORMAL HIGH (ref 80.0–100.0)
Monocytes Absolute: 1 10*3/uL (ref 0.1–1.0)
Monocytes Relative: 14 %
Neutro Abs: 4.4 10*3/uL (ref 1.7–7.7)
Neutrophils Relative %: 66 %
Platelets: 258 10*3/uL (ref 150–400)
RBC: 4.12 MIL/uL (ref 3.87–5.11)
RDW: 12.8 % (ref 11.5–15.5)
WBC: 6.7 10*3/uL (ref 4.0–10.5)
nRBC: 0 % (ref 0.0–0.2)

## 2021-12-27 LAB — SYNOVIAL CELL COUNT + DIFF, W/ CRYSTALS
Crystals, Fluid: NONE SEEN
WBC, Synovial: UNDETERMINED /mm3 (ref 0–200)

## 2021-12-27 SURGERY — ARTHROPLASTY, HIP, TOTAL,POSTERIOR APPROACH
Anesthesia: General | Site: Hip | Laterality: Right

## 2021-12-27 MED ORDER — PHENYLEPHRINE HCL-NACL 20-0.9 MG/250ML-% IV SOLN
INTRAVENOUS | Status: DC | PRN
  Administered 2021-12-27: 50 ug/min via INTRAVENOUS

## 2021-12-27 MED ORDER — TRANEXAMIC ACID-NACL 1000-0.7 MG/100ML-% IV SOLN
1000.0000 mg | INTRAVENOUS | Status: AC
  Administered 2021-12-27: 1000 mg via INTRAVENOUS

## 2021-12-27 MED ORDER — SURGIPHOR WOUND IRRIGATION SYSTEM - OPTIME
TOPICAL | Status: DC | PRN
  Administered 2021-12-27: 450 mL via TOPICAL

## 2021-12-27 MED ORDER — CEFAZOLIN SODIUM-DEXTROSE 1-4 GM/50ML-% IV SOLN
1.0000 g | Freq: Three times a day (TID) | INTRAVENOUS | Status: DC
  Administered 2021-12-28 – 2021-12-29 (×4): 1 g via INTRAVENOUS
  Filled 2021-12-27 (×4): qty 50

## 2021-12-27 MED ORDER — FENTANYL CITRATE (PF) 100 MCG/2ML IJ SOLN
INTRAMUSCULAR | Status: AC
Start: 2021-12-27 — End: 2021-12-28
  Filled 2021-12-27: qty 2

## 2021-12-27 MED ORDER — PROPOFOL 10 MG/ML IV BOLUS
INTRAVENOUS | Status: DC | PRN
Start: 2021-12-27 — End: 2021-12-27
  Administered 2021-12-27: 80 mg via INTRAVENOUS

## 2021-12-27 MED ORDER — BUPIVACAINE LIPOSOME 1.3 % IJ SUSP
INTRAMUSCULAR | Status: DC | PRN
  Administered 2021-12-27: 20 mL

## 2021-12-27 MED ORDER — BUPIVACAINE LIPOSOME 1.3 % IJ SUSP
INTRAMUSCULAR | Status: AC
  Filled 2021-12-27: qty 20

## 2021-12-27 MED ORDER — CEFAZOLIN SODIUM-DEXTROSE 2-4 GM/100ML-% IV SOLN
2.0000 g | INTRAVENOUS | Status: AC
  Administered 2021-12-27: 2 g via INTRAVENOUS

## 2021-12-27 MED ORDER — ONDANSETRON HCL 4 MG/2ML IJ SOLN
INTRAMUSCULAR | Status: DC | PRN
  Administered 2021-12-27: 4 mg via INTRAVENOUS

## 2021-12-27 MED ORDER — CHLORHEXIDINE GLUCONATE 0.12 % MT SOLN
OROMUCOSAL | Status: AC
Start: 2021-12-27 — End: 2021-12-27
  Administered 2021-12-27: 15 mL via OROMUCOSAL
  Filled 2021-12-27: qty 15

## 2021-12-27 MED ORDER — ROCURONIUM BROMIDE 10 MG/ML (PF) SYRINGE
PREFILLED_SYRINGE | INTRAVENOUS | Status: DC | PRN
  Administered 2021-12-27: 70 mg via INTRAVENOUS

## 2021-12-27 MED ORDER — MIDAZOLAM HCL 2 MG/2ML IJ SOLN
INTRAMUSCULAR | Status: DC | PRN
  Administered 2021-12-27: 1 mg via INTRAVENOUS

## 2021-12-27 MED ORDER — FENTANYL CITRATE (PF) 100 MCG/2ML IJ SOLN
25.0000 ug | INTRAMUSCULAR | Status: DC | PRN
  Administered 2021-12-27 (×3): 50 ug via INTRAVENOUS

## 2021-12-27 MED ORDER — 0.9 % SODIUM CHLORIDE (POUR BTL) OPTIME
TOPICAL | Status: DC | PRN
  Administered 2021-12-27: 1000 mL

## 2021-12-27 MED ORDER — ORAL CARE MOUTH RINSE: 15.0000 mL | Freq: Once | OROMUCOSAL | Status: AC

## 2021-12-27 MED ORDER — WATER FOR IRRIGATION, STERILE IR SOLN
Status: DC | PRN
Start: 2021-12-27 — End: 2021-12-27
  Administered 2021-12-27: 1000 mL

## 2021-12-27 MED ORDER — VITAMIN D 25 MCG (1000 UNIT) PO TABS
1000.0000 [IU] | ORAL_TABLET | Freq: Every day | ORAL | Status: DC
  Administered 2021-12-28 – 2021-12-29 (×2): 1000 [IU] via ORAL
  Filled 2021-12-27 (×2): qty 1

## 2021-12-27 MED ORDER — PHENYLEPHRINE 80 MCG/ML (10ML) SYRINGE FOR IV PUSH (FOR BLOOD PRESSURE SUPPORT)
PREFILLED_SYRINGE | INTRAVENOUS | Status: DC | PRN
  Administered 2021-12-27: 240 ug via INTRAVENOUS
  Administered 2021-12-27: 80 ug via INTRAVENOUS

## 2021-12-27 MED ORDER — LACTATED RINGERS IV SOLN: INTRAVENOUS | Status: DC

## 2021-12-27 MED ORDER — DIPHENHYDRAMINE HCL 25 MG PO CAPS
25.0000 mg | ORAL_CAPSULE | Freq: Four times a day (QID) | ORAL | Status: DC | PRN
  Administered 2021-12-27: 25 mg via ORAL
  Filled 2021-12-27: qty 1

## 2021-12-27 MED ORDER — SUGAMMADEX SODIUM 200 MG/2ML IV SOLN
INTRAVENOUS | Status: DC | PRN
Start: 2021-12-27 — End: 2021-12-27
  Administered 2021-12-27: 200 mg via INTRAVENOUS

## 2021-12-27 MED ORDER — TRANEXAMIC ACID-NACL 1000-0.7 MG/100ML-% IV SOLN
INTRAVENOUS | Status: AC
  Filled 2021-12-27: qty 100

## 2021-12-27 MED ORDER — MIDAZOLAM HCL 2 MG/2ML IJ SOLN
INTRAMUSCULAR | Status: AC
Start: 2021-12-27 — End: ?
  Filled 2021-12-27: qty 2

## 2021-12-27 MED ORDER — CHLORHEXIDINE GLUCONATE 0.12 % MT SOLN: 15.0000 mL | Freq: Once | OROMUCOSAL | Status: AC

## 2021-12-27 MED ORDER — SODIUM CHLORIDE 0.9 % IR SOLN
Status: DC | PRN
  Administered 2021-12-27: 3000 mL

## 2021-12-27 MED ORDER — CHLORHEXIDINE GLUCONATE 4 % EX LIQD: 60.0000 mL | Freq: Once | CUTANEOUS | Status: DC

## 2021-12-27 MED ORDER — CEFAZOLIN SODIUM-DEXTROSE 2-4 GM/100ML-% IV SOLN
INTRAVENOUS | Status: AC
  Filled 2021-12-27: qty 100

## 2021-12-27 MED ORDER — PROPOFOL 10 MG/ML IV BOLUS
INTRAVENOUS | Status: AC
  Filled 2021-12-27: qty 20

## 2021-12-27 MED ORDER — LIDOCAINE 2% (20 MG/ML) 5 ML SYRINGE
INTRAMUSCULAR | Status: DC | PRN
  Administered 2021-12-27: 40 mg via INTRAVENOUS

## 2021-12-27 MED ORDER — DEXAMETHASONE SODIUM PHOSPHATE 10 MG/ML IJ SOLN
INTRAMUSCULAR | Status: DC | PRN
  Administered 2021-12-27: 5 mg via INTRAVENOUS

## 2021-12-27 MED ORDER — FENTANYL CITRATE (PF) 250 MCG/5ML IJ SOLN
INTRAMUSCULAR | Status: AC
  Filled 2021-12-27: qty 5

## 2021-12-27 MED ORDER — POVIDONE-IODINE 10 % EX SWAB
2.0000 "application " | Freq: Once | CUTANEOUS | Status: AC
  Administered 2021-12-27: 2 via TOPICAL

## 2021-12-27 MED ORDER — FENTANYL CITRATE (PF) 250 MCG/5ML IJ SOLN
INTRAMUSCULAR | Status: DC | PRN
  Administered 2021-12-27: 50 ug via INTRAVENOUS
  Administered 2021-12-27 (×2): 100 ug via INTRAVENOUS

## 2021-12-27 SURGICAL SUPPLY — 67 items
ADH SKN CLS APL DERMABOND .7 (GAUZE/BANDAGES/DRESSINGS) ×1
APL PRP STRL LF DISP 70% ISPRP (MISCELLANEOUS) ×2
BLADE SAGITTAL 25.0X1.27X90 (BLADE) ×2 IMPLANT
BRUSH FEMORAL CANAL (MISCELLANEOUS) IMPLANT
CHLORAPREP W/TINT 26 (MISCELLANEOUS) ×4 IMPLANT
COVER SURGICAL LIGHT HANDLE (MISCELLANEOUS) ×2 IMPLANT
DERMABOND ADVANCED (GAUZE/BANDAGES/DRESSINGS) ×1
DERMABOND ADVANCED .7 DNX12 (GAUZE/BANDAGES/DRESSINGS) IMPLANT
DRAPE HALF SHEET 40X57 (DRAPES) ×2 IMPLANT
DRAPE HIP W/POCKET STRL (MISCELLANEOUS) ×2 IMPLANT
DRAPE INCISE IOBAN 66X45 STRL (DRAPES) ×2 IMPLANT
DRAPE INCISE IOBAN 85X60 (DRAPES) ×2 IMPLANT
DRAPE POUCH INSTRU U-SHP 10X18 (DRAPES) ×2 IMPLANT
DRAPE U-SHAPE 47X51 STRL (DRAPES) ×4 IMPLANT
DRESSING AQUACEL AG SP 3.5X10 (GAUZE/BANDAGES/DRESSINGS) ×1 IMPLANT
DRSG AQUACEL AG ADV 3.5X10 (GAUZE/BANDAGES/DRESSINGS) ×1 IMPLANT
DRSG AQUACEL AG SP 3.5X10 (GAUZE/BANDAGES/DRESSINGS) ×2
ELECT BLADE 4.0 EZ CLEAN MEGAD (MISCELLANEOUS) ×2
ELECT REM PT RETURN 15FT ADLT (MISCELLANEOUS) ×2 IMPLANT
ELECTRODE BLDE 4.0 EZ CLN MEGD (MISCELLANEOUS) ×1 IMPLANT
GLOVE SRG 8 PF TXTR STRL LF DI (GLOVE) ×1 IMPLANT
GLOVE SURG ORTHO LTX SZ8 (GLOVE) ×4 IMPLANT
GLOVE SURG UNDER POLY LF SZ8 (GLOVE) ×2
GOWN STRL REUS W/ TWL XL LVL3 (GOWN DISPOSABLE) ×1 IMPLANT
GOWN STRL REUS W/TWL LRG LVL3 (GOWN DISPOSABLE) ×4 IMPLANT
GOWN STRL REUS W/TWL XL LVL3 (GOWN DISPOSABLE) ×2
HANDPIECE INTERPULSE COAX TIP (DISPOSABLE) ×2
HEAD BIOLOX HIP 28/+4 (Joint) IMPLANT
HIP BIOLOX HD 28/+4 (Joint) ×2 IMPLANT
HOOD PEEL AWAY FLYTE STAYCOOL (MISCELLANEOUS) ×6 IMPLANT
KIT BASIN OR (CUSTOM PROCEDURE TRAY) ×2 IMPLANT
KIT TURNOVER KIT A (KITS) ×2 IMPLANT
LINER 42MM E (Orthopedic Implant) ×1 IMPLANT
LINER ADM MDM INS 28/48 42E (Liner) ×1 IMPLANT
MANIFOLD NEPTUNE II (INSTRUMENTS) ×2 IMPLANT
MARKER SKIN DUAL TIP RULER LAB (MISCELLANEOUS) ×2 IMPLANT
NDL 18GX1X1/2 (RX/OR ONLY) (NEEDLE) ×1 IMPLANT
NEEDLE 18GX1X1/2 (RX/OR ONLY) (NEEDLE) ×2 IMPLANT
NS IRRIG 1000ML POUR BTL (IV SOLUTION) ×2 IMPLANT
PACK TOTAL JOINT (CUSTOM PROCEDURE TRAY) ×2 IMPLANT
PRESSURIZER FEMORAL UNIV (MISCELLANEOUS) IMPLANT
RETRIEVER SUT HEWSON (MISCELLANEOUS) ×2 IMPLANT
SCREW HEX LP 6.5X25 (Screw) ×1 IMPLANT
SCREW HEX LP 6.5X30 (Screw) ×1 IMPLANT
SEALER BIPOLAR AQUA 6.0 (INSTRUMENTS) ×2 IMPLANT
SET HNDPC FAN SPRY TIP SCT (DISPOSABLE) ×1 IMPLANT
SET INTERPULSE LAVAGE W/TIP (ORTHOPEDIC DISPOSABLE SUPPLIES) ×2 IMPLANT
SHELL CLUSTERHOLE ACETABULAR 5 (Shell) ×1 IMPLANT
SPONGE T-LAP 18X18 ~~LOC~~+RFID (SPONGE) ×4 IMPLANT
STEM ACCOLADE SZ 6 (Hips) ×1 IMPLANT
SUCTION FRAZIER HANDLE 10FR (MISCELLANEOUS) ×1
SUCTION TUBE FRAZIER 10FR DISP (MISCELLANEOUS) ×1 IMPLANT
SUT ETHIBOND 2 V 37 (SUTURE) ×2 IMPLANT
SUT MNCRL AB 3-0 PS2 18 (SUTURE) ×2 IMPLANT
SUT STRATAFIX 1PDS 45CM VIOLET (SUTURE) ×4 IMPLANT
SUT VIC AB 0 CT1 27 (SUTURE) ×4
SUT VIC AB 0 CT1 27XBRD ANBCTR (SUTURE) ×1 IMPLANT
SUT VIC AB 1 CT1 27 (SUTURE) ×2
SUT VIC AB 1 CT1 27XBRD ANBCTR (SUTURE) IMPLANT
SUT VIC AB 2-0 CT2 27 (SUTURE) ×4 IMPLANT
SYR 20ML LL LF (SYRINGE) ×2 IMPLANT
SYR 50ML LL SCALE MARK (SYRINGE) ×2 IMPLANT
TOWEL GREEN STERILE (TOWEL DISPOSABLE) ×2 IMPLANT
TOWER CARTRIDGE SMART MIX (DISPOSABLE) IMPLANT
TRAY FOLEY MTR SLVR 16FR STAT (SET/KITS/TRAYS/PACK) ×2 IMPLANT
TUBE SUCT ARGYLE STRL (TUBING) ×2 IMPLANT
WATER STERILE IRR 1000ML POUR (IV SOLUTION) ×2 IMPLANT

## 2021-12-27 NOTE — Anesthesia Preprocedure Evaluation (Addendum)
Anesthesia Evaluation  ?Patient identified by MRN, date of birth, ID band ?Patient awake ? ? ? ?Reviewed: ?Allergy & Precautions, NPO status , Patient's Chart, lab work & pertinent test results, reviewed documented beta blocker date and time  ? ?Airway ?Mallampati: I ? ?TM Distance: >3 FB ?Neck ROM: Full ? ? ? Dental ? ?(+) Teeth Intact, Dental Advisory Given ?  ?Pulmonary ?neg pulmonary ROS, COPD,  ?  ?Pulmonary exam normal ?breath sounds clear to auscultation ? ? ? ? ? ? Cardiovascular ?+ CAD, + Past MI and + Cardiac Stents  ?Normal cardiovascular exam ?Rhythm:Regular Rate:Normal ? ?Cath 2018 ?Hyperdynamic LV function with an ejection fraction greater than 65%. ?? ?Two vessel coronary artery disease with a 95% distal in-stent restenosis in the patient's previously placed LAD stent between the first and second diagonal vessel, 20% smooth narrowing of the diagonal vessel after the stent, 20% LAD stenosis after the second diagonal vessel, 40% mid LAD stenosis, and apical 50% LAD stenosis in a small caliber apical LAD segment; 40% stenosis in the OM1 branch of the left circumflex coronary artery; and Normal RCA. ?? ?Successful percutaneous coronary intervention to the 95% in-stent restenosis treated with [facility] Cutting Balloons 2.5 x 10 and 3.0 x 10 mm with a 95% stenosis being reduced to 0%. ? ?  ?Neuro/Psych ?PSYCHIATRIC DISORDERS Bipolar Disorder negative neurological ROS ? negative psych ROS  ? GI/Hepatic ?Neg liver ROS, GERD  ,  ?Endo/Other  ?Hypothyroidism  ? Renal/GU ?negative Renal ROS  ?negative genitourinary ?  ?Musculoskeletal ?negative musculoskeletal ROS ?(+)  ? Abdominal ?  ?Peds ? Hematology ? ?(+) Blood dyscrasia (on plavix), ,   ?Anesthesia Other Findings ? ? Reproductive/Obstetrics ? ?  ? ? ? ? ? ? ? ? ? ? ? ? ? ?  ?  ? ? ? ? ? ?Anesthesia Physical ?Anesthesia Plan ? ?ASA: 3 ? ?Anesthesia Plan: General  ? ?Post-op Pain Management: Tylenol PO (pre-op)*   ? ?Induction: Intravenous ? ?PONV Risk Score and Plan: 3 and Treatment may vary due to age or medical condition, Ondansetron and Dexamethasone ? ?Airway Management Planned: Oral ETT ? ?Additional Equipment: None ? ?Intra-op Plan:  ? ?Post-operative Plan: Extubation in OR ? ?Informed Consent:  ? ?Plan Discussed with:  ? ?Anesthesia Plan Comments: (Last dose of Lovenox 12/26/21 @ ~0900. Last dose of Plavix 12/24/21. Will plan for GETA. )  ? ? ? ? ? ? ?Anesthesia Quick Evaluation ? ?

## 2021-12-27 NOTE — Discharge Instructions (Signed)
INSTRUCTIONS AFTER JOINT REPLACEMENT  ? ?Remove items at home which could result in a fall. This includes throw rugs or furniture in walking pathways ?ICE to the affected joint every three hours while awake for 30 minutes at a time, for at least the first 3-5 days, and then as needed for pain and swelling.  Continue to use ice for pain and swelling. You may notice swelling that will progress down to the foot and ankle.  This is normal after surgery.  Elevate your leg when you are not up walking on it.   ?Continue to use the breathing machine you got in the hospital (incentive spirometer) which will help keep your temperature down.  It is common for your temperature to cycle up and down following surgery, especially at night when you are not up moving around and exerting yourself.  The breathing machine keeps your lungs expanded and your temperature down. ? ? ?DIET:  As you were doing prior to hospitalization, we recommend a well-balanced diet. ? ?DRESSING / WOUND CARE / SHOWERING ? ?Keep the surgical dressing until follow up.  The dressing is water proof, so you can shower without any extra covering.  IF THE DRESSING FALLS OFF or the wound gets wet inside, change the dressing with sterile gauze.  Please use good hand washing techniques before changing the dressing.  Do not use any lotions or creams on the incision until instructed by your surgeon.   ? ?ACTIVITY ? ?Increase activity slowly as tolerated, but follow the weight bearing instructions below.   ?No driving for 6 weeks or until further direction given by your physician.  You cannot drive while taking narcotics.  ?No lifting or carrying greater than 10 lbs. until further directed by your surgeon. ?Avoid periods of inactivity such as sitting longer than an hour when not asleep. This helps prevent blood clots.  ?You may return to work once you are authorized by your doctor.  ? ? ? ?WEIGHT BEARING  ? ?Weight bearing as tolerated with assist device (walker, cane,  etc) as directed, use it as long as suggested by your surgeon or therapist, typically at least 4-6 weeks. ? ? ?EXERCISES ? ?Results after joint replacement surgery are often greatly improved when you follow the exercise, range of motion and muscle strengthening exercises prescribed by your doctor. Safety measures are also important to protect the joint from further injury. Any time any of these exercises cause you to have increased pain or swelling, decrease what you are doing until you are comfortable again and then slowly increase them. If you have problems or questions, call your caregiver or physical therapist for advice.  ? ?Rehabilitation is important following a joint replacement. After just a few days of immobilization, the muscles of the leg can become weakened and shrink (atrophy).  These exercises are designed to build up the tone and strength of the thigh and leg muscles and to improve motion. Often times heat used for twenty to thirty minutes before working out will loosen up your tissues and help with improving the range of motion but do not use heat for the first two weeks following surgery (sometimes heat can increase post-operative swelling).  ? ?These exercises can be done on a training (exercise) mat, on the floor, on a table or on a bed. Use whatever works the best and is most comfortable for you.    Use music or television while you are exercising so that the exercises are a pleasant break in your   day. This will make your life better with the exercises acting as a break in your routine that you can look forward to.   Perform all exercises about fifteen times, three times per day or as directed.  You should exercise both the operative leg and the other leg as well. ? ?Exercises include: ?  ?Quad Sets - Tighten up the muscle on the front of the thigh (Quad) and hold for 5-10 seconds.   ?Straight Leg Raises - With your knee straight (if you were given a brace, keep it on), lift the leg to 60  degrees, hold for 3 seconds, and slowly lower the leg.  Perform this exercise against resistance later as your leg gets stronger.  ?Leg Slides: Lying on your back, slowly slide your foot toward your buttocks, bending your knee up off the floor (only go as far as is comfortable). Then slowly slide your foot back down until your leg is flat on the floor again.  ?Angel Wings: Lying on your back spread your legs to the side as far apart as you can without causing discomfort.  ?Hamstring Strength:  Lying on your back, push your heel against the floor with your leg straight by tightening up the muscles of your buttocks.  Repeat, but this time bend your knee to a comfortable angle, and push your heel against the floor.  You may put a pillow under the heel to make it more comfortable if necessary.  ? ?A rehabilitation program following joint replacement surgery can speed recovery and prevent re-injury in the future due to weakened muscles. Contact your doctor or a physical therapist for more information on knee rehabilitation.  ? ? ?CONSTIPATION ? ?Constipation is defined medically as fewer than three stools per week and severe constipation as less than one stool per week.  Even if you have a regular bowel pattern at home, your normal regimen is likely to be disrupted due to multiple reasons following surgery.  Combination of anesthesia, postoperative narcotics, change in appetite and fluid intake all can affect your bowels.  ? ?YOU MUST use at least one of the following options; they are listed in order of increasing strength to get the job done.  They are all available over the counter, and you may need to use some, POSSIBLY even all of these options:   ? ?Drink plenty of fluids (prune juice may be helpful) and high fiber foods ?Colace 100 mg by mouth twice a day  ?Senokot for constipation as directed and as needed Dulcolax (bisacodyl), take with full glass of water  ?Miralax (polyethylene glycol) once or twice a day as  needed. ? ?If you have tried all these things and are unable to have a bowel movement in the first 3-4 days after surgery call either your surgeon or your primary doctor.   ? ?If you experience loose stools or diarrhea, hold the medications until you stool forms back up.  If your symptoms do not get better within 1 week or if they get worse, check with your doctor.  If you experience "the worst abdominal pain ever" or develop nausea or vomiting, please contact the office immediately for further recommendations for treatment. ? ? ?ITCHING:  If you experience itching with your medications, try taking only a single pain pill, or even half a pain pill at a time.  You can also use Benadryl over the counter for itching or also to help with sleep.  ? ?TED HOSE STOCKINGS:  Use stockings on both   legs until for at least 2 weeks or as directed by physician office. They may be removed at night for sleeping. ? ?MEDICATIONS:  See your medication summary on the ?After Visit Summary? that nursing will review with you.  You may have some home medications which will be placed on hold until you complete the course of blood thinner medication.  It is important for you to complete the blood thinner medication as prescribed. ? ? ?Blood clot prevention (DVT Prophylaxis): After surgery you are at an increased risk for a blood clot. you were prescribed a blood thinner, Aspirin 81mg, to be taken twice daily for a total of 4 weeks from surgery to help reduce your risk of getting a blood clot. This will help prevent a blood clot. Signs of a pulmonary embolus (blood clot in the lungs) include sudden short of breath, feeling lightheaded or dizzy, chest pain with a deep breath, rapid pulse rapid breathing. Signs of a blood clot in your arms or legs include new unexplained swelling and cramping, warm, red or darkened skin around the painful area. Please call the office or 911 right away if these signs or symptoms develop. ? ?PRECAUTIONS:  If you  experience chest pain or shortness of breath - call 911 immediately for transfer to the hospital emergency department.  ? ?If you develop a fever greater that 101 F, purulent drainage from wound, increased r

## 2021-12-27 NOTE — Progress Notes (Signed)
? ? ? ?  Subjective: ? ?Patient reports pain as well controlled this morning.  She denies any new pain.  She denies any distal numbness and tingling.  Intraop cxs remain negative. Her daughter is at bedside.  Questions regarding plan for surgery were answered. Patient was given IV abx yesterday for urine cx which was positive for e. Coli. She has been asymptomatic. ? ?Objective:  ? ?VITALS:   ?Vitals:  ? 12/26/21 2030 12/27/21 0332 12/27/21 0750 12/27/21 1320  ?BP: 113/65 (!) 141/66 (!) 163/72 126/81  ?Pulse: 77 82 84 75  ?Resp: $Remov'18 12 17 17  'AvcoTz$ ?Temp: 98.7 ?F (37.1 ?C) (!) 97.5 ?F (36.4 ?C) 98.1 ?F (36.7 ?C) 97.9 ?F (36.6 ?C)  ?TempSrc: Oral Oral Axillary Oral  ?SpO2: 97% 97% 98% 96%  ?Weight:    62.6 kg  ?Height:    '5\' 3"'$  (1.6 m)  ? ? ?Sensation intact distally ?Intact pulses distally ?Dorsiflexion/Plantar flexion intact ?No cellulitis present ?Compartment soft ? ? ?Lab Results  ?Component Value Date  ? WBC 6.7 12/27/2021  ? HGB 13.4 12/27/2021  ? HCT 41.3 12/27/2021  ? MCV 100.2 (H) 12/27/2021  ? PLT 258 12/27/2021  ? ?BMET ?   ?Component Value Date/Time  ? NA 135 12/27/2021 0251  ? NA 135 09/25/2021 1206  ? K 3.9 12/27/2021 0251  ? CL 104 12/27/2021 0251  ? CO2 23 12/27/2021 0251  ? GLUCOSE 116 (H) 12/27/2021 0251  ? BUN 15 12/27/2021 0251  ? BUN 14 09/25/2021 1206  ? CREATININE 0.55 12/27/2021 0251  ? CALCIUM 9.0 12/27/2021 0251  ? EGFR 92 09/25/2021 1206  ? GFRNONAA >60 12/27/2021 0251  ? ?Xray: Pelvis right hip demonstrate severe osteonecrosis and femoral head collapse.  Moderate degenerative acetabular changes. ? ?Assessment/Plan: ?Day of Surgery  ? ?Principal Problem: ?  Right hip pain ?Active Problems: ?  Coronary artery disease involving native coronary artery of native heart without angina pectoris ?  Hypothyroidism ?  Mixed hyperlipidemia ?  COPD (chronic obstructive pulmonary disease) ([place]) ? 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 ? ?Work-up for infection thus far has been negative.   Inflammatory marker versus CRP, ESR, white count have all been normal.  Patient has been afebrile.  Aspiration only demonstrated 1 cc of bloody fluid.  Insufficient for cell count.  Culture results remain negative at 2 days.  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 ? ?Discussed plan again for surgery with left hip total arthroplasty, possible removal of iliac screw, possible placement of antibiotic spacer. ?Additional risks benefits and alternatives were discussed with the patient including but not limited to the risks of nonoperative treatment, versus surgical intervention including infection, bleeding, nerve injury, periprosthetic fracture, the need for revision surgery, dislocation, leg length discrepancy, blood clots, cardiopulmonary complications, morbidity, mortality, among others, and they were willing to proceed.   ? ?Patient and daughter express understanding and agreement with proceeding with surgery today. ? ? ?Rochelle Nephew A Rubin Dais ?12/27/2021, 1:30 PM ? ? ?Charlies Constable, MD ? ?Contact information:   ?Weekdays 7am-5pm epic message Dr. Zachery Dakins, or call office for patient follow up: [phone] ?After hours and holidays please check [email or web address] for group call information for [facility] ? ? ?

## 2021-12-27 NOTE — Op Note (Addendum)
12/27/2021  4:59 PM  PATIENT:  Deborah [name]   MRN: [id number]  PRE-OPERATIVE DIAGNOSIS: Rapidly progressive osteonecrosis of the right femoral head  POST-OPERATIVE DIAGNOSIS:  same  PROCEDURE:  Procedure(s): Right total hip arthroplasty, partial hardware removal of right iliac screw, primary repair of the right hip abductors  PREOPERATIVE INDICATIONS:    Deborah [name] is an 71 y.o. female who has had ongoing right hip pain for some time; however, had relatively normal x-rays.  In early March underwent a diagnostic cortisone steroid injection into the right hip joint.  Following this she had rapid progressive worsening of her groin pain and disability to the point where she has been nonambulatory for the past couple of weeks.  A CT of her pelvis was obtained that demonstrated complete erosion of her femoral head.  She underwent preop work-up including inflammatory markers and aspiration to rule out infection which were all within normal limits.  Given the rapid erosion of her femoral head total hip arthroplasty was indicated and recommended.  Discussed given the recent hip steroid injection she is at increased risk for perioperative infection.  Additional risks benefits and alternatives were discussed with the patient including but not limited to the risks of nonoperative treatment, versus surgical intervention including infection, bleeding, nerve injury, periprosthetic fracture, the need for revision surgery, dislocation, leg length discrepancy, blood clots, cardiopulmonary complications, morbidity, mortality, among others, and they were willing to proceed.    OPERATIVE REPORT     SURGEON:  Weber Cooks, MD  FIRST ASSISTANT: Teryl Lucy, MD, given case complexity was necessary for safe and timely completion of the procedure.    SECOND ASSISTANT: Darron Doom, RNFA, (Present throughout the entire procedure,  necessary for completion of procedure in a timely manner, assisting  with retraction, instrumentation, and closure)     ANESTHESIA: General  ESTIMATED BLOOD LOSS: 300cc    COMPLICATIONS:  None.     UNIQUE ASPECTS OF THE CASE: Severe erosive changes of the femoral head, relatively normal appearance of the acetabulum, prominent ileum screw was partially removed using a carbide metal cutting burr to allow for placement of the acetabular component.  Also with significant deficiency of the abductors on the greater trochanter.  These were repaired side-to-side using #1 Vicryl suture  COMPONENTS:   Stryker Trident 2 size 52 mm acetabular shell, MDM liner, 28 x 48 polyethylene dual mobility ball, 28+4 ceramic head, Accolade 2 size 6, hex screws 6.5x2 Implant Name Type Inv. Item Serial No. Manufacturer Lot No. LRB No. Used Action  HIP BIOLOX HD 28/+4 - [id number] Joint HIP BIOLOX HD 28/+4  [employer] [id number] Right 1 Implanted  SHELL CLUSTERHOLE ACETABULAR 5 - [id number] Shell SHELL CLUSTERHOLE ACETABULAR 5  [employer] [id number] Right 1 Implanted  STEM ACCOLADE SZ 6 - [id number] Hips STEM ACCOLADE SZ 6  [employer] [id number] Right 1 Implanted  LINER E - [id number] Orthopedic Implant LINER E  [employer]  Right 1 Implanted  LINER ADM MDM INS 28/48 42E - [id number] Liner LINER ADM MDM INS 28/48 42E  [employer] [id number] Right 1 Implanted  SCREW HEX LP 6.5X25 - [id number] Screw SCREW HEX LP 6.5X25  [employer] UCW Right 1 Implanted  SCREW HEX LP 6.5X30 - [id number] Screw SCREW HEX LP 6.5X30  [employer] [id number] Right 1 Implanted      PROCEDURE IN DETAIL:   The patient was met in the holding area and  identified.  The appropriate hip was identified and  marked at the operative site.  The patient was then transported to the OR  and  placed under anesthesia.  At that point, the patient was  placed in the lateral decubitus position with the operative side up and  secured to the operating room table  and all bony  prominences padded. A subaxillary role was also placed.    The operative lower extremity was prepped from the iliac crest to the distal leg.  Sterile draping was performed.  Time out was performed prior to incision.      A routine posterolateral approach was utilized via sharp dissection  carried down to the subcutaneous tissue.  Gross bleeders were Bovie coagulated.  The iliotibial band was identified and incised along the length of the skin incision through the glute max fascia.  Charnley retractor was placed with care to protect the sciatic nerve posteriorly.  With the hip internally rotated, the piriformis tendon was identified and released from the femoral insertion and tagged with a #5 Ethibond.  A capsulotomy was then performed off the femoral insertion and also tagged with a #5 Ethibond.  Upon entry into the capsule significant thick mucus granular tissue was expressed.  This was collected in a syringe and sent for stat cell count.  According to the lab the tissue was too mucoid for a cell count.  The femoral neck was exposed, and I resected the femoral neck based on preoperative templating relative to the lesser trochanter.    I then exposed the deep acetabulum, cleared out any tissue including the ligamentum teres.  The acetabulum had relatively normal-appearing cartilage without concerns for erosion or significant degenerative changes. After adequate visualization, I excised the labrum.  I then started reaming with a 50 mm reamer, first medializing to the floor of the cotyloid fossa, and then in the position of the cup aiming towards the greater sciatic notch, matching the version of the transverse acetabular ligament and tucked under the anterior wall.  Care was taken given the iliac screw.  This appropriate depth was achieved the iliac screw was encountered.  Using a carbide 4.0 oval high-speed burr, the tip of the screw head was able to be resected approximately 4 to 5 mm not interfere with  further reaming or acetabular shell placement. I reamed up to 52 mm reamer with good bony bed preparation and a 52 mm cup was chosen.  The real cup was then impacted into place.  Appropriate version and inclination was confirmed clinically matching their bony anatomy, and also with the use of the jig.  I placed 2 screws in the posterior superior quadrant to augment fixation.  3 deep tissue specimens from the hip joint were obtained and sent for permanent aerobic and anaerobic cultures.  Given the patient's lumbopelvic fixation elected to use a dual mobility liner to allow for greater stability. A MDM liner was placed and impacted. It was confirmed to be appropriately seated and the acetabular retractors were removed.    I then prepared the proximal femur using the box cutter, Charnley awl, and then sequentially broached starting with 0 up to a size 6.  A trial broach, neck, and head was utilized, and I reduced the hip and it was found to have excellent stability.  There was no impingement with full extension and 90 degrees external rotation.  The hip was stable at the position of sleep and with 90 degrees flexion and 70 degrees of internal rotation.  Leg lengths were also clinically assessed in the  lateral position and felt to be equal.  A final femoral prosthesis size 6 was selected. I then impacted the real femoral prosthesis into place.I again trialed and selected a +61mm ball.  The dual mobility ball was then assembled and confirmed to be freely rotating inside the outer ball. I impacted the real head ball into place. The hip was then reduced and taken through a range of motion. There was no impingement with full extension and 90 degrees external rotation.  The hip was stable at the position of sleep and with 90 degrees flexion and 70 degrees of internal rotation. Leg lengths were  again assessed and felt to be restored.  The posterior capsule was then closed with #2 Ethibond.  The piriformis was  repaired through the base of the abductor tendon using a [place] suture passer.  The abductors were also repaired using #1 Vicryl and a side-to-side fashion.  I then irrigated the hip copiously again with pulse lavage. Periarticular injection was then performed with Exparel.  Intraop flat plate xray was obtained and components were confirmed to be in good position without fracture. We repaired the fascia #1 barbed suture, followed by 0 vicryl suture for the subcutaneous fat.  Skin was closed with 2-0 Vicryl and 3-0 Monocryl.  Dermabond and Aquacel dressing were applied. The patient was then awakened and returned to PACU in stable and satisfactory condition.  Leg lengths in the supine position were assessed and felt to be clinically equal. There were no complications.  Post op recs: WB: WBAT RLE, posterior hip precautions x6 weeks Abx: ancef 1g q8 while in house. Will discharge with 7 days cefadroxil 500BID Imaging: PACU pelvis Xray Dressing: Aquacell, keep intact until follow up DVT prophylaxis: restart patient's aspirin 81mg  weekly POD1, and plavix 75mg  daily POD2 Follow up: 2 weeks after surgery for a wound check with Dr. Blanchie Dessert at [facility].  Address: [address], [place], [place] [address]  Office Phone: [phone]   Weber Cooks, MD Orthopedic Surgeon

## 2021-12-27 NOTE — Transfer of Care (Signed)
Immediate Anesthesia Transfer of Care Note ? ?Patient: Arna Snipe ? ?Procedure(s) Performed: RIGHT TOTAL HIP ARTHROPLASTY POSTERIOR (Right: Hip) ? ?Patient Location: PACU ? ?Anesthesia Type:General ? ?Level of Consciousness: awake and alert  ? ?Airway & Oxygen Therapy: Patient Spontanous Breathing and Patient connected to nasal cannula oxygen ? ?Post-op Assessment: Report given to RN and Post -op Vital signs reviewed and stable ? ?Post vital signs: Reviewed and stable ? ?Last Vitals:  ?Vitals Value Taken Time  ?BP 151/97 12/27/21 1734  ?Temp    ?Pulse 89 12/27/21 1738  ?Resp 16 12/27/21 1738  ?SpO2 98 % 12/27/21 1738  ?Vitals shown include unvalidated device data. ? ?Last Pain:  ?Vitals:  ? 12/27/21 1334  ?TempSrc:   ?PainSc: 0-No pain  ?   ? ?Patients Stated Pain Goal: 0 (12/27/21 1027) ? ?Complications: No notable events documented. ?

## 2021-12-27 NOTE — Progress Notes (Signed)
S/ ?PROGRESS NOTE ? ? ? ?Deborah [name]  PPI:[id number] DOB: 1951/07/28 DOA: 12/24/2021 ?PCP: Rogers Blocker, MD  ? ? ?Brief Narrative:  ? ?Deborah [name] is a 71 year old female with past medical history significant for CAD s/p PCI/stent LAD 2018, history of MI with V-fib arrest 2006, COPD, GERD, hypothyroidism who presented to [facility] ED on 4/25 by direction of orthopedics, Dr. Mardelle Matte for right hip pain.  Patient with longstanding history of right hip pain, progressing over the last 6 weeks making it difficult for her to bear weight.  Patient was initially referred to neurosurgery who obtained CT right hip revealing high-grade proximal femoral erosion and moderate diffuse acetabular erosion and subsequently was referred to orthopedics for evaluation in the clinic; but given concern for possible infectious process, patient was directed to the ED for further evaluation.  While at [facility], patient underwent IR guided joint aspiration that was unrevealing for infectious process.  Orthopedics recommended transfer to [facility] for surgical intervention. ? ? ?Assessment & Plan: ?  ? ?Severe right hip osteonecrosis  ?Patient presenting with progressive right hip pain.  Afebrile with no leukocytosis.  ESR/CRP within normal limits.  Underwent IR guided joint aspiration with 1 mL bloody fluid removed; WBC count 2 and otherwise to few to count. ?--Orthopedics following, appreciate assistance ?--Blood cultures x2: No growth x2 days ?--Body fluid culture with no organisms on Gram stain, no growth x2 days ?--Percocet 5-325 mg p.o. q4h PRN severe pain ?--Robaxin 500 mg PO QID PRN muscle spasm ?--Morphine 2 mg IV q4h PRN severe pain ?--Orthopedics plans operative management today, n.p.o. ? ?E. coli UTI ?--Cefazolin 1 g IV every 8 hours ? ?CAD s/p PCI/stent ?Essential hypertension ?History of MI with V-fib arrest ?Patient with acute anterior MI with V-fib arrest 2006 with PCI and Taxus stent to mid LAD,  subsequently with PCI of in-stent stenosis of LAD 2018.  Chest pain-free. ?--Carvedilol 6.25 mg p.o. twice daily ?--Crestor 10 mg p.o. daily ?--Holding home aspirin/Plavix ? ?COPD ?Stable, oxygen well on room air.  Not on inhalers outpatient. ? ?Hypothyroidism ?--Levothyroxine 75 mcg p.o. daily ? ?Anxiety/depression: Sertraline 100 mg p.o. daily ? ? ?DVT prophylaxis: Place and maintain sequential compression device Start: 12/26/21 1615 ? ?  Code Status: Full Code ?Family Communication: Updated daughter present at bedside this morning ? ?Disposition Plan:  ?Level of care: Med-Surg ?Status is: Inpatient ?Remains inpatient appropriate because: Pending surgical intervention today ?  ? ?Consultants:  ?Orthopedics, Dr. Zachery Dakins ? ?Procedures:  ?None ? ?Antimicrobials:  ?Ceftriaxone 4/27 - 4/28 ?Cefazolin 4/28>> ? ? ?Subjective: ?Patient seen examined bedside, resting comfortably.  Complains of mild hip pain.  Daughter present in room.  Awaiting surgical intervention today.  No other specific questions or concerns at this time.  Denies headache, no chest pain, no shortness of breath, no abdominal pain, no fever/chills/night sweats, no nausea/vomiting/diarrhea, no focal weakness, no fatigue, no paresthesias.  No acute events overnight per nurse staff. ? ?Objective: ?Vitals:  ? 12/26/21 1721 12/26/21 2030 12/27/21 0332 12/27/21 0750  ?BP: 118/72 113/65 (!) 141/66 (!) 163/72  ?Pulse: 85 77 82 84  ?Resp:  18 12 17   ?Temp: 97.9 ?F (36.6 ?C) 98.7 ?F (37.1 ?C) (!) 97.5 ?F (36.4 ?C) 98.1 ?F (36.7 ?C)  ?TempSrc: Oral Oral Oral Axillary  ?SpO2: 99% 97% 97% 98%  ?Weight:      ?Height:      ? ? ?Intake/Output Summary (Last 24 hours) at 12/27/2021 1129 ?Last data  filed at 12/27/2021 0000 ?Gross per 24 hour  ?Intake 1020 ml  ?Output --  ?Net 1020 ml  ? ?Filed Weights  ? 12/24/21 2253  ?Weight: 62.6 kg  ? ? ?Examination: ? ?Physical Exam: ?GEN: NAD, alert and oriented x 3, wd/wn ?HEENT: NCAT, PERRL, EOMI, sclera clear, MMM ?PULM: CTAB  w/o wheezes/crackles, normal respiratory effort, on room air ?CV: RRR w/o M/G/R ?GI: abd soft, NTND, NABS, no R/G/M ?MSK: no peripheral edema, neurovascular intact, limited right hip ROM due to pain ?NEURO: CN II-XII intact, no focal deficits, sensation to light touch intact ?PSYCH: normal mood/affect ?Integumentary: dry/intact, no rashes or wounds ? ? ? ?Data Reviewed: I have personally reviewed following labs and imaging studies ? ?CBC: ?Recent Labs  ?Lab 12/24/21 ?2355 12/25/21 ?0867 12/26/21 ?0409 12/27/21 ?0251  ?WBC 6.2 5.8 6.3 6.7  ?NEUTROABS 4.1 3.7  --  4.4  ?HGB 13.1 13.5 14.4 13.4  ?HCT 38.7 40.8 42.3 41.3  ?MCV 101.3* 100.5* 99.5 100.2*  ?PLT 266 282 263 258  ? ?Basic Metabolic Panel: ?Recent Labs  ?Lab 12/24/21 ?2355 12/25/21 ?6195 12/26/21 ?0409 12/27/21 ?0251  ?NA 137 138 138 135  ?K 3.6 3.8 3.6 3.9  ?CL 102 104 105 104  ?CO2 29 28 26 23   ?GLUCOSE 116* 101* 100* 116*  ?BUN 18 15 11 15   ?CREATININE 0.56 0.50 0.51 0.55  ?CALCIUM 9.2 9.3 9.4 9.0  ?MG  --   --  2.3  --   ? ?GFR: ?Estimated Creatinine Clearance: 52.6 mL/min (by C-G formula based on SCr of 0.55 mg/dL). ?Liver Function Tests: ?Recent Labs  ?Lab 12/24/21 ?2355 12/25/21 ?0932  ?AST 29 25  ?ALT 21 22  ?ALKPHOS 111 105  ?BILITOT 0.5 0.6  ?PROT 6.7 6.8  ?ALBUMIN 3.8 4.0  ? ?No results for input(s): LIPASE, AMYLASE in the last 168 hours. ?No results for input(s): AMMONIA in the last 168 hours. ?Coagulation Profile: ?Recent Labs  ?Lab 12/25/21 ?6712  ?INR 1.0  ? ?Cardiac Enzymes: ?No results for input(s): CKTOTAL, CKMB, CKMBINDEX, TROPONINI in the last 168 hours. ?BNP (last 3 results) ?No results for input(s): PROBNP in the last 8760 hours. ?HbA1C: ?No results for input(s): HGBA1C in the last 72 hours. ?CBG: ?No results for input(s): GLUCAP in the last 168 hours. ?Lipid Profile: ?No results for input(s): CHOL, HDL, LDLCALC, TRIG, CHOLHDL, LDLDIRECT in the last 72 hours. ?Thyroid Function Tests: ?No results for input(s): TSH, T4TOTAL, FREET4,  T3FREE, THYROIDAB in the last 72 hours. ?Anemia Panel: ?No results for input(s): VITAMINB12, FOLATE, FERRITIN, TIBC, IRON, RETICCTPCT in the last 72 hours. ?Sepsis Labs: ?No results for input(s): PROCALCITON, LATICACIDVEN in the last 168 hours. ? ?Recent Results (from the past 240 hour(s))  ?Resp Panel by RT-PCR (Flu A&B, Covid) Nasopharyngeal Swab     Status: None  ? Collection Time: 12/25/21 12:45 AM  ? Specimen: Nasopharyngeal Swab; Nasopharyngeal(NP) swabs in vial transport medium  ?Result Value Ref Range Status  ? SARS Coronavirus 2 by RT PCR NEGATIVE NEGATIVE Final  ?  Comment: (NOTE) ?SARS-CoV-2 target nucleic acids are NOT DETECTED. ? ?The SARS-CoV-2 RNA is generally detectable in upper respiratory ?specimens during the acute phase of infection. The lowest ?concentration of SARS-CoV-2 viral copies this assay can detect is ?138 copies/mL. A negative result does not preclude SARS-Cov-2 ?infection and should not be used as the sole basis for treatment or ?other patient management decisions. A negative result may occur with  ?improper specimen collection/handling, submission of specimen other ?than nasopharyngeal swab, presence  of viral mutation(s) within the ?areas targeted by this assay, and inadequate number of viral ?copies(<138 copies/mL). A negative result must be combined with ?clinical observations, patient history, and epidemiological ?information. The expected result is Negative. ? ?Fact Sheet for Patients:  ?EntrepreneurPulse.com.au ? ?Fact Sheet for Healthcare Providers:  ?IncredibleEmployment.be ? ?This test is no t yet approved or cleared by the [place] FDA and  ?has been authorized for detection and/or diagnosis of SARS-CoV-2 by ?FDA under an Emergency Use Authorization (EUA). This EUA will remain  ?in effect (meaning this test can be used) for the duration of the ?COVID-19 declaration under Section 564(b)(1) of the Act, 21 ?U.S.C.section 360bbb-3(b)(1),  unless the authorization is terminated  ?or revoked sooner.  ? ? ?  ? Influenza A by PCR NEGATIVE NEGATIVE Final  ? Influenza B by PCR NEGATIVE NEGATIVE Final  ?  Comment: (NOTE) ?The Xpert Xpress SARS-CoV-2/FLU/RSV p

## 2021-12-27 NOTE — Anesthesia Procedure Notes (Signed)
Procedure Name: Intubation ?Date/Time: 12/27/2021 2:56 PM ?Performed by: Eligha Bridegroom, CRNA ?Pre-anesthesia Checklist: Patient identified, Emergency Drugs available, Suction available, Patient being monitored and Timeout performed ?Patient Re-evaluated:Patient Re-evaluated prior to induction ?Oxygen Delivery Method: Circle system utilized ?Preoxygenation: Pre-oxygenation with 100% oxygen ?Induction Type: IV induction ?Laryngoscope Size: Mac and 3 ?Grade View: Grade I ?Tube type: Oral ?Number of attempts: 1 ?Airway Equipment and Method: Stylet ?Placement Confirmation: ETT inserted through vocal cords under direct vision, positive ETCO2 and breath sounds checked- equal and bilateral ?Secured at: 21 cm ?Tube secured with: Tape ?Dental Injury: Teeth and Oropharynx as per pre-operative assessment  ? ? ? ? ?

## 2021-12-28 DIAGNOSIS — M25551 Pain in right hip: Secondary | ICD-10-CM | POA: Diagnosis not present

## 2021-12-28 LAB — BASIC METABOLIC PANEL
Anion gap: 5 (ref 5–15)
BUN: 18 mg/dL (ref 8–23)
CO2: 26 mmol/L (ref 22–32)
Calcium: 8.9 mg/dL (ref 8.9–10.3)
Chloride: 103 mmol/L (ref 98–111)
Creatinine, Ser: 0.66 mg/dL (ref 0.44–1.00)
GFR, Estimated: >60 mL/min (ref >60)
Glucose, Bld: 139 mg/dL — ABNORMAL HIGH (ref 70–99)
Potassium: 3.6 mmol/L (ref 3.5–5.1)
Sodium: 134 mmol/L — ABNORMAL LOW (ref 135–145)

## 2021-12-28 LAB — CBC
HCT: 37.4 % (ref 36.0–46.0)
Hemoglobin: 12.4 g/dL (ref 12.0–15.0)
MCH: 33.2 pg (ref 26.0–34.0)
MCHC: 33.2 g/dL (ref 30.0–36.0)
MCV: 100 fL (ref 80.0–100.0)
Platelets: 239 10*3/uL (ref 150–400)
RBC: 3.74 MIL/uL — ABNORMAL LOW (ref 3.87–5.11)
RDW: 12.9 % (ref 11.5–15.5)
WBC: 10.6 10*3/uL — ABNORMAL HIGH (ref 4.0–10.5)
nRBC: 0 % (ref 0.0–0.2)

## 2021-12-28 MED ORDER — KETOROLAC TROMETHAMINE 15 MG/ML IJ SOLN
7.5000 mg | Freq: Three times a day (TID) | INTRAMUSCULAR | Status: AC
  Administered 2021-12-28 – 2021-12-29 (×4): 7.5 mg via INTRAVENOUS
  Filled 2021-12-28 (×4): qty 1

## 2021-12-28 MED ORDER — ASPIRIN EC 81 MG PO TBEC
81.0000 mg | DELAYED_RELEASE_TABLET | Freq: Once | ORAL | Status: AC
  Administered 2021-12-28: 81 mg via ORAL
  Filled 2021-12-28: qty 1

## 2021-12-28 MED ORDER — OXYCODONE HCL 5 MG PO TABS: 5.0000 mg | ORAL_TABLET | ORAL | Status: DC | PRN

## 2021-12-28 MED ORDER — METHOCARBAMOL 500 MG PO TABS: 500.0000 mg | ORAL_TABLET | Freq: Three times a day (TID) | ORAL | Status: DC | PRN

## 2021-12-28 MED ORDER — SERTRALINE HCL 100 MG PO TABS
100.0000 mg | ORAL_TABLET | Freq: Two times a day (BID) | ORAL | Status: DC
  Administered 2021-12-28 – 2021-12-29 (×2): 100 mg via ORAL
  Filled 2021-12-28 (×2): qty 1

## 2021-12-28 MED ORDER — ACETAMINOPHEN 500 MG PO TABS
1000.0000 mg | ORAL_TABLET | Freq: Three times a day (TID) | ORAL | Status: DC
  Administered 2021-12-28 – 2021-12-29 (×4): 1000 mg via ORAL
  Filled 2021-12-28 (×4): qty 2

## 2021-12-28 MED ORDER — CLOPIDOGREL BISULFATE 75 MG PO TABS
75.0000 mg | ORAL_TABLET | Freq: Every day | ORAL | Status: DC
Start: 2021-12-29 — End: 2021-12-29
  Administered 2021-12-29: 75 mg via ORAL
  Filled 2021-12-28: qty 1

## 2021-12-28 NOTE — Anesthesia Postprocedure Evaluation (Signed)
Anesthesia Post Note ? ?Patient: Deborah [name] ? ?Procedure(s) Performed: RIGHT TOTAL HIP ARTHROPLASTY POSTERIOR (Right: Hip) ? ?  ? ?Patient location during evaluation: PACU ?Anesthesia Type: General ?Level of consciousness: awake and alert ?Pain management: pain level controlled ?Vital Signs Assessment: post-procedure vital signs reviewed and stable ?Respiratory status: spontaneous breathing, nonlabored ventilation, respiratory function stable and patient connected to nasal cannula oxygen ?Cardiovascular status: blood pressure returned to baseline and stable ?Postop Assessment: no apparent nausea or vomiting ?Anesthetic complications: no ? ? ?No notable events documented. ? ?Last Vitals:  ?Vitals:  ? 12/28/21 0821 12/28/21 1210  ?BP: (!) 156/77 116/60  ?Pulse: 96 95  ?Resp: 20 20  ?Temp: 36.9 ?C 36.7 ?C  ?SpO2: 98% 98%  ?  ?Last Pain:  ?Vitals:  ? 12/28/21 1210  ?TempSrc: Oral  ?PainSc:   ? ? ?  ?  ?  ?  ?  ?  ? ?Nelle Don Tharon Bomar ? ? ? ? ?
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
## 2021-12-28 NOTE — Progress Notes (Incomplete)
? ? ? ?  Subjective: ? ?Patient reports pain as {DEGREE - MILD, MOD, SEV:20224}.  *** ? ?Objective:  ? ?VITALS:   ?Vitals:  ? 12/27/21 1804 12/27/21 1825 12/27/21 2127 12/28/21 0501  ?BP: (!) 167/90 (!) 164/86 (!) 156/83 (!) 148/81  ?Pulse: 98 94 (!) 106 95  ?Resp: 18 17    ?Temp: 97.9 ?F (36.6 ?C) 98 ?F (36.7 ?C) 97.9 ?F (36.6 ?C) 98.2 ?F (36.8 ?C)  ?TempSrc:  Oral Oral Oral  ?SpO2: 94% 96% 100% 97%  ?Weight:      ?Height:      ? ? ?{physical exam:3041401} ?*** ? ?Lab Results  ?Component Value Date  ? WBC 10.6 (H) 12/28/2021  ? HGB 12.4 12/28/2021  ? HCT 37.4 12/28/2021  ? MCV 100.0 12/28/2021  ? PLT 239 12/28/2021  ? ?BMET ?   ?Component Value Date/Time  ? NA 134 (L) 12/28/2021 0151  ? NA 135 09/25/2021 1206  ? K 3.6 12/28/2021 0151  ? CL 103 12/28/2021 0151  ? CO2 26 12/28/2021 0151  ? GLUCOSE 139 (H) 12/28/2021 0151  ? BUN 18 12/28/2021 0151  ? BUN 14 09/25/2021 1206  ? CREATININE 0.66 12/28/2021 0151  ? CALCIUM 8.9 12/28/2021 0151  ? EGFR 92 09/25/2021 1206  ? GFRNONAA >60 12/28/2021 0151  ? ? ? ? ?Xray: *** ? ?Assessment/Plan: ?1 Day Post-Op  ? ?Principal Problem: ?  Right hip pain ?Active Problems: ?  Coronary artery disease involving native coronary artery of native heart without angina pectoris ?  Hypothyroidism ?  Mixed hyperlipidemia ?  COPD (chronic obstructive pulmonary disease) ([place]) ? ? ?{ITUY:2903795} ?*** ? ?{ORTHOADMISSIONSTATUS:21269} ? ? ? ?Deborah [name] ?12/28/2021, 7:29 AM ? ? ?Charlies Constable, MD ? ?Contact information:   ?Weekdays 7am-5pm epic message Dr. Zachery Dakins, or call office for patient follow up: [phone] ?After hours and holidays please check [email or web address] for group call information for [facility] ? ?  ?
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
## 2021-12-28 NOTE — Progress Notes (Addendum)
? ? ? ?  Subjective: ? ?Patient reports pain as very well controlled.  She has been up to the bedside commode 4 times overnight without any issues.  Denies any distal numbness and tingling.  Eager to work with physical therapy.  Patient lives alone so concerned about discharge postoperatively would like to go to rehab.  Discussed DVT prophylaxis, patient takes aspirin and Plavix at home.  Only able to take aspirin once weekly because of issues with bruising and bleeding.  No history of DVT.  We will plan to restart her aspirin today and Plavix tomorrow.  Gram stain on Intra-Op cultures has been negative.  We will continue to follow cultures. ? ?Objective:  ? ?VITALS:   ?Vitals:  ? 12/27/21 1804 12/27/21 1825 12/27/21 2127 12/28/21 0501  ?BP: (!) 167/90 (!) 164/86 (!) 156/83 (!) 148/81  ?Pulse: 98 94 (!) 106 95  ?Resp: 18 17    ?Temp: 97.9 ?F (36.6 ?C) 98 ?F (36.7 ?C) 97.9 ?F (36.6 ?C) 98.2 ?F (36.8 ?C)  ?TempSrc:  Oral Oral Oral  ?SpO2: 94% 96% 100% 97%  ?Weight:      ?Height:      ? ? ?Sensation intact distally ?Intact pulses distally ?Dorsiflexion/Plantar flexion intact ?Incision: dressing C/D/I ?No cellulitis present ?Compartment soft ? ? ?Lab Results  ?Component Value Date  ? WBC 10.6 (H) 12/28/2021  ? HGB 12.4 12/28/2021  ? HCT 37.4 12/28/2021  ? MCV 100.0 12/28/2021  ? PLT 239 12/28/2021  ? ?BMET ?   ?Component Value Date/Time  ? NA 134 (L) 12/28/2021 0151  ? NA 135 09/25/2021 1206  ? K 3.6 12/28/2021 0151  ? CL 103 12/28/2021 0151  ? CO2 26 12/28/2021 0151  ? GLUCOSE 139 (H) 12/28/2021 0151  ? BUN 18 12/28/2021 0151  ? BUN 14 09/25/2021 1206  ? CREATININE 0.66 12/28/2021 0151  ? CALCIUM 8.9 12/28/2021 0151  ? EGFR 92 09/25/2021 1206  ? GFRNONAA >60 12/28/2021 0151  ? ? ?Xray: Total hip arthroplasty components in good position without adverse features. ? ?Assessment/Plan: ?1 Day Post-Op  ? 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 ?Post op recs: ?WB: WBAT RLE, posterior hip precautions x6 weeks ?Abx:  ancef 1g q8 while in house. Will discharge with 7 days cefadroxil 500BID ?Imaging: PACU pelvis Xray ?Dressing: Aquacell, keep intact until follow up ?DVT prophylaxis: restart patient's aspirin 58m weekly POD1, and plavix 781mdaily POD2 ?Follow up: 2 weeks after surgery [facility] ?[address] [address]?Office Phone: [phone]  ? ? ? ? ?Maryse Brierley A Wilbert Schouten ?12/28/2021, 7:30 AM ? ? ?DaCharlies ConstableMD ? ?Contact information:   ?Weekdays 7am-5pm epic message Dr. MaZachery Dakinsor call office for patient follow up: [phone] ?After hours and holidays please check [email or web address] for group call information for [facility] ? ?  ?

## 2021-12-29 ENCOUNTER — Inpatient Hospital Stay (HOSPITAL_COMMUNITY)
Admission: EM | Admit: 2021-12-29 | Discharge: 2022-01-04 | DRG: 560 | Disposition: A | Discharge status: Home Health | Payer: Medicare Other | Source: Intra-hospital | Attending: Physical Medicine and Rehabilitation | Admitting: Physical Medicine and Rehabilitation

## 2021-12-29 ENCOUNTER — Encounter (HOSPITAL_COMMUNITY): Payer: Self-pay | Admitting: Physical Medicine and Rehabilitation

## 2021-12-29 ENCOUNTER — Other Ambulatory Visit: Payer: Self-pay

## 2021-12-29 DIAGNOSIS — M87051 Idiopathic aseptic necrosis of right femur: Secondary | ICD-10-CM | POA: Diagnosis present

## 2021-12-29 DIAGNOSIS — I252 Old myocardial infarction: Secondary | ICD-10-CM | POA: Diagnosis not present

## 2021-12-29 DIAGNOSIS — Z8249 Family history of ischemic heart disease and other diseases of the circulatory system: Secondary | ICD-10-CM

## 2021-12-29 DIAGNOSIS — R197 Diarrhea, unspecified: Secondary | ICD-10-CM | POA: Diagnosis not present

## 2021-12-29 DIAGNOSIS — Z471 Aftercare following joint replacement surgery: Secondary | ICD-10-CM | POA: Diagnosis not present

## 2021-12-29 DIAGNOSIS — H9190 Unspecified hearing loss, unspecified ear: Secondary | ICD-10-CM | POA: Diagnosis present

## 2021-12-29 DIAGNOSIS — M25561 Pain in right knee: Secondary | ICD-10-CM | POA: Diagnosis not present

## 2021-12-29 DIAGNOSIS — J449 Chronic obstructive pulmonary disease, unspecified: Secondary | ICD-10-CM | POA: Diagnosis present

## 2021-12-29 DIAGNOSIS — Z8674 Personal history of sudden cardiac arrest: Secondary | ICD-10-CM

## 2021-12-29 DIAGNOSIS — G4700 Insomnia, unspecified: Secondary | ICD-10-CM | POA: Diagnosis present

## 2021-12-29 DIAGNOSIS — Z955 Presence of coronary angioplasty implant and graft: Secondary | ICD-10-CM

## 2021-12-29 DIAGNOSIS — Z96641 Presence of right artificial hip joint: Secondary | ICD-10-CM | POA: Diagnosis present

## 2021-12-29 DIAGNOSIS — I10 Essential (primary) hypertension: Secondary | ICD-10-CM | POA: Diagnosis present

## 2021-12-29 DIAGNOSIS — Z91018 Allergy to other foods: Secondary | ICD-10-CM

## 2021-12-29 DIAGNOSIS — I2510 Atherosclerotic heart disease of native coronary artery without angina pectoris: Secondary | ICD-10-CM | POA: Diagnosis present

## 2021-12-29 DIAGNOSIS — Z888 Allergy status to other drugs, medicaments and biological substances status: Secondary | ICD-10-CM | POA: Diagnosis not present

## 2021-12-29 DIAGNOSIS — G8929 Other chronic pain: Secondary | ICD-10-CM | POA: Diagnosis present

## 2021-12-29 DIAGNOSIS — I4891 Unspecified atrial fibrillation: Secondary | ICD-10-CM | POA: Diagnosis not present

## 2021-12-29 DIAGNOSIS — N390 Urinary tract infection, site not specified: Secondary | ICD-10-CM | POA: Diagnosis present

## 2021-12-29 DIAGNOSIS — Z87892 Personal history of anaphylaxis: Secondary | ICD-10-CM

## 2021-12-29 DIAGNOSIS — E039 Hypothyroidism, unspecified: Secondary | ICD-10-CM | POA: Diagnosis present

## 2021-12-29 DIAGNOSIS — Z885 Allergy status to narcotic agent status: Secondary | ICD-10-CM

## 2021-12-29 DIAGNOSIS — F319 Bipolar disorder, unspecified: Secondary | ICD-10-CM | POA: Diagnosis not present

## 2021-12-29 DIAGNOSIS — R5381 Other malaise: Secondary | ICD-10-CM | POA: Diagnosis present

## 2021-12-29 DIAGNOSIS — Z7982 Long term (current) use of aspirin: Secondary | ICD-10-CM

## 2021-12-29 DIAGNOSIS — Z981 Arthrodesis status: Secondary | ICD-10-CM

## 2021-12-29 DIAGNOSIS — M25551 Pain in right hip: Secondary | ICD-10-CM | POA: Diagnosis not present

## 2021-12-29 HISTORY — DX: Idiopathic aseptic necrosis of right femur: M87.051

## 2021-12-29 LAB — BASIC METABOLIC PANEL
Anion gap: 9 (ref 5–15)
BUN: 18 mg/dL (ref 8–23)
CO2: 24 mmol/L (ref 22–32)
Calcium: 8.6 mg/dL — ABNORMAL LOW (ref 8.9–10.3)
Chloride: 101 mmol/L (ref 98–111)
Creatinine, Ser: 0.73 mg/dL (ref 0.44–1.00)
GFR, Estimated: >60 mL/min (ref >60)
Glucose, Bld: 125 mg/dL — ABNORMAL HIGH (ref 70–99)
Potassium: 3.7 mmol/L (ref 3.5–5.1)
Sodium: 134 mmol/L — ABNORMAL LOW (ref 135–145)

## 2021-12-29 LAB — BODY FLUID CULTURE W GRAM STAIN
Culture: NO GROWTH
Gram Stain: NONE SEEN

## 2021-12-29 LAB — CBC
HCT: 34 % — ABNORMAL LOW (ref 36.0–46.0)
Hemoglobin: 11 g/dL — ABNORMAL LOW (ref 12.0–15.0)
MCH: 32.4 pg (ref 26.0–34.0)
MCHC: 32.4 g/dL (ref 30.0–36.0)
MCV: 100 fL (ref 80.0–100.0)
Platelets: 203 10*3/uL (ref 150–400)
RBC: 3.4 MIL/uL — ABNORMAL LOW (ref 3.87–5.11)
RDW: 12.7 % (ref 11.5–15.5)
WBC: 8.3 10*3/uL (ref 4.0–10.5)
nRBC: 0 % (ref 0.0–0.2)

## 2021-12-29 LAB — MAGNESIUM: Magnesium: 2.1 mg/dL (ref 1.7–2.4)

## 2021-12-29 MED ORDER — AMPHETAMINE-DEXTROAMPHET ER 5 MG PO CP24
5.0000 mg | ORAL_CAPSULE | Freq: Every day | ORAL | Status: DC
  Administered 2021-12-30 – 2022-01-04 (×6): 5 mg via ORAL
  Filled 2021-12-29 (×6): qty 1

## 2021-12-29 MED ORDER — METHOCARBAMOL 500 MG PO TABS
500.0000 mg | ORAL_TABLET | Freq: Four times a day (QID) | ORAL | Status: DC | PRN
  Administered 2021-12-30 – 2022-01-03 (×2): 500 mg via ORAL
  Filled 2021-12-29 (×2): qty 1

## 2021-12-29 MED ORDER — MAGNESIUM OXIDE -MG SUPPLEMENT 400 (240 MG) MG PO TABS
200.0000 mg | ORAL_TABLET | Freq: Two times a day (BID) | ORAL | Status: DC
  Administered 2021-12-29 – 2021-12-31 (×4): 200 mg via ORAL
  Filled 2021-12-29 (×4): qty 1

## 2021-12-29 MED ORDER — VALACYCLOVIR HCL 500 MG PO TABS
1000.0000 mg | ORAL_TABLET | Freq: Every day | ORAL | Status: DC
Start: 2021-12-30 — End: 2022-01-04
  Administered 2021-12-30 – 2022-01-04 (×6): 1000 mg via ORAL
  Filled 2021-12-29 (×6): qty 2

## 2021-12-29 MED ORDER — ASPIRIN EC 81 MG PO TBEC
81.0000 mg | DELAYED_RELEASE_TABLET | Freq: Every day | ORAL | Status: DC
  Administered 2021-12-29: 81 mg via ORAL
  Filled 2021-12-29: qty 1

## 2021-12-29 MED ORDER — OYSTER SHELL CALCIUM/D3 500-5 MG-MCG PO TABS
1.0000 | ORAL_TABLET | Freq: Two times a day (BID) | ORAL | Status: DC
  Administered 2021-12-29 – 2021-12-31 (×4): 1 via ORAL
  Filled 2021-12-29 (×4): qty 1

## 2021-12-29 MED ORDER — ONDANSETRON HCL 4 MG PO TABS: 4.0000 mg | ORAL_TABLET | Freq: Four times a day (QID) | ORAL | Status: DC | PRN

## 2021-12-29 MED ORDER — LEVOTHYROXINE SODIUM 75 MCG PO TABS
75.0000 ug | ORAL_TABLET | Freq: Every day | ORAL | Status: DC
  Administered 2021-12-30 – 2022-01-04 (×6): 75 ug via ORAL
  Filled 2021-12-29 (×6): qty 1

## 2021-12-29 MED ORDER — METHOCARBAMOL 500 MG PO TABS: 500.0000 mg | ORAL_TABLET | Freq: Four times a day (QID) | ORAL | Status: DC | PRN

## 2021-12-29 MED ORDER — NITROGLYCERIN 0.4 MG SL SUBL: 0.4000 mg | SUBLINGUAL_TABLET | SUBLINGUAL | Status: DC | PRN

## 2021-12-29 MED ORDER — FLUTICASONE PROPIONATE 50 MCG/ACT NA SUSP
1.0000 | Freq: Every day | NASAL | Status: DC
  Administered 2021-12-30 – 2022-01-04 (×6): 1 via NASAL
  Filled 2021-12-29: qty 16

## 2021-12-29 MED ORDER — ROSUVASTATIN CALCIUM 5 MG PO TABS
10.0000 mg | ORAL_TABLET | ORAL | Status: DC
  Administered 2021-12-29 – 2022-01-02 (×3): 10 mg via ORAL
  Filled 2021-12-29 (×3): qty 2

## 2021-12-29 MED ORDER — CEFADROXIL 500 MG PO CAPS
500.0000 mg | ORAL_CAPSULE | Freq: Two times a day (BID) | ORAL | Status: DC
  Administered 2021-12-29 – 2021-12-30 (×2): 500 mg via ORAL
  Filled 2021-12-29 (×2): qty 1

## 2021-12-29 MED ORDER — CARVEDILOL 3.125 MG PO TABS
3.1250 mg | ORAL_TABLET | Freq: Two times a day (BID) | ORAL | Status: DC
Start: 2021-12-29 — End: 2022-01-04
  Administered 2021-12-29 – 2022-01-04 (×12): 3.125 mg via ORAL
  Filled 2021-12-29 (×12): qty 1

## 2021-12-29 MED ORDER — LORATADINE 10 MG PO TABS
10.0000 mg | ORAL_TABLET | Freq: Every day | ORAL | Status: DC
Start: 2021-12-29 — End: 2022-01-04
  Administered 2021-12-29 – 2022-01-04 (×7): 10 mg via ORAL
  Filled 2021-12-29 (×7): qty 1

## 2021-12-29 MED ORDER — AMPHETAMINE-DEXTROAMPHET ER 10 MG PO CP24
20.0000 mg | ORAL_CAPSULE | Freq: Every day | ORAL | Status: DC
  Administered 2021-12-30 – 2022-01-04 (×6): 20 mg via ORAL
  Filled 2021-12-29 (×4): qty 2
  Filled 2021-12-29: qty 1
  Filled 2021-12-29 (×2): qty 2

## 2021-12-29 MED ORDER — SENNA 8.6 MG PO TABS
1.0000 | ORAL_TABLET | Freq: Two times a day (BID) | ORAL | Status: DC
  Administered 2021-12-30 (×2): 8.6 mg via ORAL
  Filled 2021-12-29 (×5): qty 1

## 2021-12-29 MED ORDER — HYDROCODONE-ACETAMINOPHEN 10-325 MG PO TABS
1.0000 | ORAL_TABLET | ORAL | Status: DC | PRN
  Administered 2021-12-30 – 2022-01-03 (×7): 1 via ORAL
  Filled 2021-12-29 (×7): qty 1

## 2021-12-29 MED ORDER — GABAPENTIN 400 MG PO CAPS
400.0000 mg | ORAL_CAPSULE | Freq: Four times a day (QID) | ORAL | Status: DC
  Administered 2021-12-29 – 2022-01-04 (×23): 400 mg via ORAL
  Filled 2021-12-29 (×23): qty 1

## 2021-12-29 MED ORDER — DEXTROAMPHETAMINE SULFATE 5 MG PO TABS: 15.0000 mg | ORAL_TABLET | Freq: Two times a day (BID) | ORAL | Status: DC

## 2021-12-29 MED ORDER — CEFADROXIL 500 MG PO CAPS: 500.0000 mg | ORAL_CAPSULE | Freq: Two times a day (BID) | ORAL | 0 refills | Status: DC

## 2021-12-29 MED ORDER — BISACODYL 5 MG PO TBEC: 5.0000 mg | DELAYED_RELEASE_TABLET | Freq: Every day | ORAL | Status: DC | PRN

## 2021-12-29 MED ORDER — GUAIFENESIN ER 600 MG PO TB12
600.0000 mg | ORAL_TABLET | Freq: Two times a day (BID) | ORAL | Status: DC
  Administered 2021-12-29 – 2022-01-04 (×12): 600 mg via ORAL
  Filled 2021-12-29 (×12): qty 1

## 2021-12-29 MED ORDER — ACETAMINOPHEN 325 MG PO TABS
325.0000 mg | ORAL_TABLET | ORAL | Status: DC | PRN
  Administered 2021-12-30 – 2022-01-03 (×6): 650 mg via ORAL
  Filled 2021-12-29 (×9): qty 2

## 2021-12-29 MED ORDER — AMPHETAMINE-DEXTROAMPHET ER 5 MG PO CP24: 25.0000 mg | ORAL_CAPSULE | Freq: Every day | ORAL | Status: DC

## 2021-12-29 MED ORDER — ONDANSETRON HCL 4 MG/2ML IJ SOLN: 4.0000 mg | Freq: Four times a day (QID) | INTRAMUSCULAR | Status: DC | PRN

## 2021-12-29 MED ORDER — CLOPIDOGREL BISULFATE 75 MG PO TABS
75.0000 mg | ORAL_TABLET | Freq: Every day | ORAL | Status: DC
  Administered 2021-12-30 – 2022-01-04 (×6): 75 mg via ORAL
  Filled 2021-12-29 (×6): qty 1

## 2021-12-29 MED ORDER — SENNOSIDES-DOCUSATE SODIUM 8.6-50 MG PO TABS: 1.0000 | ORAL_TABLET | Freq: Two times a day (BID) | ORAL | Status: DC

## 2021-12-29 MED ORDER — ENOXAPARIN SODIUM 40 MG/0.4ML IJ SOSY
40.0000 mg | PREFILLED_SYRINGE | INTRAMUSCULAR | Status: DC
  Filled 2021-12-29: qty 0.4

## 2021-12-29 MED ORDER — SERTRALINE HCL 100 MG PO TABS
100.0000 mg | ORAL_TABLET | Freq: Two times a day (BID) | ORAL | Status: DC
  Administered 2021-12-29 – 2022-01-04 (×12): 100 mg via ORAL
  Filled 2021-12-29 (×12): qty 1

## 2021-12-29 NOTE — Progress Notes (Signed)
Pt refused Lovenox administration this evening. Pt states that one of her previous doctors told her to not take Lovenox at this time. Pt wants to wait until in the morning to discuss with doctor regarding the blood thinners. Educated pt on Lovenox and risks of refusing, pt voices understanding. Secure chat sent to Dr. Wynn Banker. Documented in [facility].  ?

## 2021-12-29 NOTE — Progress Notes (Signed)
Inpatient Rehab Admissions Coordinator:  ° °I have a CIR bed for this Pt. And can admit her today. RN may call report to [phone]. ° °Reginaldo Hazard, MS, CCC-SLP °Rehab Admissions Coordinator  °[phone] (celll) °[phone] (office) ° °

## 2021-12-29 NOTE — Progress Notes (Signed)
? ? ? ?Subjective: ?2 Days Post-Op s/p Procedure(s): ?RIGHT TOTAL HIP ARTHROPLASTY POSTERIOR ? ? Patient alert states pain is under control. Has been up and on the bedside commode as well as working on exercises in bed. Denies CP, SOB, nausea, vomiting, or abdominal pain. Eager to start inpatient rehab.  ? ?Objective:  ?PE: ?VITALS:   ?Vitals:  ? 12/28/21 1210 12/28/21 2140 12/29/21 0600 12/29/21 0909  ?BP: 116/60 131/66 (!) 155/85 (!) 145/73  ?Pulse: 95  88   ?Resp: 20 12 16 20   ?Temp: 98.1 ?F (36.7 ?C) 98.1 ?F (36.7 ?C) 98.6 ?F (37 ?C) 98.5 ?F (36.9 ?C)  ?TempSrc: Oral  Oral Oral  ?SpO2: 98%  100% 100%  ?Weight:      ?Height:      ? ?General: sitting up in bed, in no acute distress ?MSK: Sensation intact distally, Intact pulses distally, Dorsiflexion/Plantar flexion intact, dressing C/D/I with mild ecchymosis just posterior to aquacell dressing. ? ?LABS ? ?Results for orders placed or performed during the hospital encounter of 12/24/21 (from the past 24 hour(s))  ?CBC     Status: Abnormal  ? Collection Time: 12/29/21  1:24 AM  ?Result Value Ref Range  ? WBC 8.3 4.0 - 10.5 K/uL  ? RBC 3.40 (L) 3.87 - 5.11 MIL/uL  ? Hemoglobin 11.0 (L) 12.0 - 15.0 g/dL  ? HCT 34.0 (L) 36.0 - 46.0 %  ? MCV 100.0 80.0 - 100.0 fL  ? MCH 32.4 26.0 - 34.0 pg  ? MCHC 32.4 30.0 - 36.0 g/dL  ? RDW 12.7 11.5 - 15.5 %  ? Platelets 203 150 - 400 K/uL  ? nRBC 0.0 0.0 - 0.2 %  ?Basic metabolic panel     Status: Abnormal  ? Collection Time: 12/29/21  1:24 AM  ?Result Value Ref Range  ? Sodium 134 (L) 135 - 145 mmol/L  ? Potassium 3.7 3.5 - 5.1 mmol/L  ? Chloride 101 98 - 111 mmol/L  ? CO2 24 22 - 32 mmol/L  ? Glucose, Bld 125 (H) 70 - 99 mg/dL  ? BUN 18 8 - 23 mg/dL  ? Creatinine, Ser 0.73 0.44 - 1.00 mg/dL  ? Calcium 8.6 (L) 8.9 - 10.3 mg/dL  ? GFR, Estimated >60 >60 mL/min  ? Anion gap 9 5 - 15  ?Magnesium     Status: None  ? Collection Time: 12/29/21  1:24 AM  ?Result Value Ref Range  ? Magnesium 2.1 1.7 - 2.4 mg/dL  ? ? ?DG HIP PORT UNILAT  WITH PELVIS 1V RIGHT ? ?Result Date: 12/27/2021 ?CLINICAL DATA:  Right hip replacement. EXAM: DG HIP (WITH OR WITHOUT PELVIS) 1V PORT RIGHT COMPARISON:  December 25, 2021. FINDINGS: Single intraoperative cross-table lateral projection of the right hip was obtained. Right acetabular and femoral components are well situated. Expected postoperative changes are seen in the surrounding soft tissues. IMPRESSION: Status post right total hip arthroplasty. Electronically Signed   By: Marijo Conception M.D.   On: 12/27/2021 16:50  ? ?DG HIP UNILAT W OR W/O PELVIS 2-3 VIEWS RIGHT ? ?Result Date: 12/27/2021 ?CLINICAL DATA:  Status post right hip arthroplasty EXAM: DG HIP (WITH OR WITHOUT PELVIS) 2V RIGHT COMPARISON:  Film from earlier in the same day. FINDINGS: Right hip replacement is noted similar to that noted on the prior postoperative film. Postsurgical changes in the lower lumbar spine and sacrum are again seen and stable. No acute fracture or dislocation is noted. No soft tissue changes are seen. IMPRESSION: Status post  right hip replacement stable from prior exam. Electronically Signed   By: Inez Catalina M.D.   On: 12/27/2021 20:04   ? ?Assessment/Plan: ? 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  ?Post op recs: ?WB: WBAT RLE, posterior hip precautions x6 weeks ?Abx: ancef 1g q8 while in house. Will discharge with 7 days cefadroxil 500BID ?Imaging: PACU pelvis Xray ?Dressing: Aquacell, keep intact until follow up ?DVT prophylaxis: restart patient's aspirin 81mg  weekly POD1, and plavix 75mg  daily POD2 ?Follow up: 2 weeks after surgery for a wound check with Dr. Zachery [name] ?Dispo: okay for discharge to inpatient rehab from ortho standpoint when bed available ?  ?Contact information:   ?Weekdays [address] [place] [phone] A ?fter hours and holidays please check [email or web address] for group call information for [facility] ? ?Deborah [name] ?12/29/2021, 9:58 AM  ?
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
## 2021-12-29 NOTE — Discharge Summary (Signed)
?Physician Discharge Summary  ?Deborah [name] JKD:[id number] DOB: July 20, 1951 DOA: 12/24/2021 ? ?PCP: Rogers Blocker, MD ? ?Admit date: 12/24/2021 ?Discharge date: 12/29/2021 ? ?Admitted From: Home ?Disposition: CIR ? ?Recommendations for Outpatient Follow-up:  ?Follow up with PCP in 1-2 weeks ?Follow-up with orthopedics, Dr. Zachery Dakins in 2 weeks ?Continue antibiotics with cefadroxil 500 mg p.o. twice daily x7 days per orthopedics after discharge ?DVT prophylaxis with resumption of weekly aspirin and daily Plavix ?Please follow up on the following pending results: Surgical pathology and operative cultures that were pending at time of discharge ? ?Discharge Condition: Stable ?CODE STATUS: Full code ?Diet recommendation: Heart healthy diet ? ?History of present illness: ? ?Deborah [name] is a 71 year old female with past medical history significant for CAD s/p PCI/stent LAD 2018, history of MI with V-fib arrest 2006, COPD, GERD, hypothyroidism who presented to [facility] ED on 4/25 by direction of orthopedics, Dr. Mardelle Matte for right hip pain.  Patient with longstanding history of right hip pain, progressing over the last 6 weeks making it difficult for her to bear weight.  Patient was initially referred to neurosurgery who obtained CT right hip revealing high-grade proximal femoral erosion and moderate diffuse acetabular erosion and subsequently was referred to orthopedics for evaluation in the clinic; but given concern for possible infectious process, patient was directed to the ED for further evaluation.  While at [facility], patient underwent IR guided joint aspiration that was unrevealing for infectious process.  Orthopedics recommended transfer to [facility] for surgical intervention. ? ?Hospital course: ? ?Severe right hip osteonecrosis  ?Patient presenting with progressive right hip pain.  Afebrile with no leukocytosis.  ESR/CRP within normal limits.  IR guided joint aspiration with 1 mL bloody fluid  removed; WBC count 2 and otherwise to few to count.  Underwent right total hip arthroplasty, partial hardware removalof right iliac screw and primary repair of right hip abductors by orthopedics, Dr. Zachery Dakins on 12/27/2021.  Patient was continued on cefazolin 1 g IV every 8 hours while inpatient and will transition to cefadroxil 500 mg p.o. twice daily x7 days on discharge.  Patient is weightbearing as tolerated right lower extremity with posterior hip precautions x6 weeks.  DVT prophylaxis with aspirin 81 mg p.o. weekly and Plavix 75 mg p.o. daily.  Seen by PT/OT and discharging to CIR for further rehabilitation.  Outpatient follow-up with orthopedics 2 weeks.  Follow-up finalized surgical pathology and operative cultures that were pending at time of discharge. ?  ?E. coli UTI ?Treated with IV cefazolin while inpatient, now transitioning to cefadroxil as above. ?  ?CAD s/p PCI/stent ?Essential hypertension ?History of MI with V-fib arrest ?Patient with acute anterior MI with V-fib arrest 2006 with PCI and Taxus stent to mid LAD, subsequently with PCI of in-stent stenosis of LAD 2018.  Chest pain-free.  Continue home carvedilol 3.125 mg p.o. twice daily, Crestor 10 mg p.o. daily, aspirin 80 mg p.o. weekly and Plavix 75 mg p.o. daily. ?--Holding home aspirin/Plavix ?  ?COPD ?Stable, oxygen well on room air.  Not on inhalers outpatient. ?  ?Hypothyroidism ?Levothyroxine 75 mcg p.o. daily ?  ?Anxiety/depression: Sertraline 100 mg p.o. twice daily ?  ?ADHD: ?Adderall XR 25 mg p.o. daily ? ? ? ?Discharge Diagnoses:  ?Principal Problem: ?  Right hip pain ?Active Problems: ?  Coronary artery disease involving native coronary artery of native heart without angina pectoris ?  COPD (chronic obstructive pulmonary disease) ([place]) ?  Mixed hyperlipidemia ?  Hypothyroidism ? ? ? ?  Discharge Instructions ? ?Discharge Instructions   ? ? Call MD for:  difficulty breathing, headache or visual disturbances   Complete by: As directed ?   ? Call MD for:  extreme fatigue   Complete by: As directed ?  ? Call MD for:  persistant dizziness or light-headedness   Complete by: As directed ?  ? Call MD for:  persistant nausea and vomiting   Complete by: As directed ?  ? Call MD for:  severe uncontrolled pain   Complete by: As directed ?  ? Call MD for:  temperature >100.4   Complete by: As directed ?  ? Diet - low sodium heart healthy   Complete by: As directed ?  ? Increase activity slowly   Complete by: As directed ?  ? No wound care   Complete by: As directed ?  ? ?  ? ?Allergies as of 12/29/2021   ? ?   Reactions  ? Chocolate Anaphylaxis, Itching, Swelling  ? Meloxicam Swelling  ? Face swells and flushes it also  ? Altace [ramipril] Other (See Comments)  ? Very low heart rate  ? Demerol [meperidine] Nausea Only  ? Ibuprofen Other (See Comments)  ? Extreme heartburn results if not accompanied by an antacid  ? ?  ? ?  ?Medication List  ?  ? ?STOP taking these medications   ? ?senna 8.6 MG Tabs tablet ?Commonly known as: SENOKOT ?  ? ?  ? ?TAKE these medications   ? ?amphetamine-dextroamphetamine 25 MG 24 hr capsule ?Commonly known as: ADDERALL XR ?Take 25 mg by mouth every morning. ?  ?aspirin 81 MG EC tablet ?Take 1 tablet (81 mg total) by mouth daily. ?What changed: when to take this ?  ?CALCIUM + D3 PO ?Take 1 tablet by mouth 2 (two) times daily. ?  ?carvedilol 3.125 MG tablet ?Commonly known as: COREG ?TAKE ONE TABLET BY MOUTH TWICE A DAY WITH A MEAL ?What changed:  ?how much to take ?how to take this ?additional instructions ?  ?cefadroxil 500 MG capsule ?Commonly known as: DURICEF ?Take 1 capsule (500 mg total) by mouth 2 (two) times daily for 7 days. ?  ?cetirizine 10 MG tablet ?Commonly known as: ZYRTEC ?Take 10 mg by mouth daily as needed for allergies. ?  ?clobetasol cream 0.05 % ?Commonly known as: TEMOVATE ?Apply 1 application. topically daily as needed (dry skin). ?  ?clopidogrel 75 MG tablet ?Commonly known as: PLAVIX ?TAKE ONE TABLET BY  MOUTH DAILY WITH BREAKFAST ?What changed: when to take this ?  ?dextroamphetamine 15 MG 24 hr capsule ?Commonly known as: DEXEDRINE SPANSULE ?Take 15 mg by mouth 2 (two) times daily. MORNING and NOON(TIME) ?  ?diphenhydrAMINE 25 MG tablet ?Commonly known as: BENADRYL ?Take 25 mg by mouth 3 (three) times daily as needed for itching or allergies. ?  ?estradiol 0.5 MG tablet ?Commonly known as: ESTRACE ?Take 0.5 mg by mouth every morning. ?  ?fluticasone 50 MCG/ACT nasal spray ?Commonly known as: FLONASE ?Place 2 sprays into both nostrils daily. ?  ?gabapentin 400 MG capsule ?Commonly known as: NEURONTIN ?Take 400 mg by mouth 4 (four) times daily. ?  ?guaiFENesin 600 MG 12 hr tablet ?Commonly known as: Staples ?Take 600 mg by mouth 2 (two) times daily as needed for to loosen phlegm. ?  ?HYDROcodone-acetaminophen 10-325 MG tablet ?Commonly known as: NORCO ?Take 1 tablet by mouth every 6 (six) hours as needed for moderate pain. ?  ?levothyroxine 75 MCG tablet ?Commonly known as: SYNTHROID ?  Take 75 mcg by mouth daily before breakfast. ?  ?Magnesium Oxide 200 MG Tabs ?Take 200 mg by mouth 2 (two) times daily. ?  ?methocarbamol 500 MG tablet ?Commonly known as: ROBAXIN ?Take 1 tablet (500 mg total) by mouth every 6 (six) hours as needed for muscle spasms. ?What changed:  ?medication strength ?how much to take ?when to take this ?reasons to take this ?  ?nitroGLYCERIN 0.4 MG SL tablet ?Commonly known as: NITROSTAT ?Place 1 tablet (0.4 mg total) under the tongue every 5 (five) minutes as needed for chest pain. ?  ?One-A-Day Womens 50+ Advantage Tabs ?Take 1 tablet by mouth daily. ?  ?Prolia 60 MG/ML Sosy injection ?Generic drug: denosumab ?60 mg every 6 (six) months. ?  ?rosuvastatin 10 MG tablet ?Commonly known as: CRESTOR ?Take 10 mg by mouth every other day. ?  ?senna-docusate 8.6-50 MG tablet ?Commonly known as: Senokot-S ?Take 1 tablet by mouth 2 (two) times daily. ?  ?sertraline 100 MG tablet ?Commonly known as:  ZOLOFT ?Take 100 mg by mouth 2 (two) times daily. ?  ?TURMERIC PO ?Take 1 capsule by mouth daily. ?  ?valACYclovir 1000 MG tablet ?Commonly known as: VALTREX ?Take 1,000 mg by mouth daily. ?  ? ?  ? ? Follow-up

## 2021-12-29 NOTE — Progress Notes (Addendum)
Inpatient Rehabilitation Admission Medication Review by a Pharmacist ? ?A complete drug regimen review was completed for this patient to identify any potential clinically significant medication issues. ? ?High Risk Drug Classes Is patient taking? Indication by Medication  ?Antipsychotic No   ?Anticoagulant Yes Lovenox for DVT px  ?Antibiotic Yes Cefadroxil - osteonecrosis  ?Valtrex - herpes px  ?Opioid Yes Vicodin for pain  ?Antiplatelet Yes Plavix - CAD  ?Hypoglycemics/insulin No   ?Vasoactive Medication Yes Coreg - HTN  ?Chemotherapy No   ?Other Yes Adderall - ADHD ?Zoloft - depression ?Synthroid - hypothyroidism ?Gabapentin - neuropathy  ? ? ? ?Type of Medication Issue Identified Description of Issue Recommendation(s)  ?Drug Interaction(s) (clinically significant) ?    ?Duplicate Therapy ?    ?Allergy ?    ?No Medication Administration End Date ?    ?Incorrect Dose ?    ?Additional Drug Therapy Needed ? crestor Messaged Dr Letta Pate - ok to resume  ?Significant med changes from prior encounter (inform family/care partners about these prior to discharge).    ?Other ?    ? ? ?Clinically significant medication issues were identified that warrant physician communication and completion of prescribed/recommended actions by midnight of the next day:  Yes>>resolved ? ?Name of provider notified for urgent issues identified: Dr Letta Pate ? ?Provider Method of Notification: [place] ? ? ? ?Pharmacist comments:  ? ?Time spent performing this drug regimen review (minutes):  20 ? ? ?Onnie Boer, PharmD, BCIDP, AAHIVP, CPP ?Infectious Disease Pharmacist ?12/29/2021 4:49 PM ? ? ?

## 2021-12-30 DIAGNOSIS — M87051 Idiopathic aseptic necrosis of right femur: Secondary | ICD-10-CM | POA: Diagnosis not present

## 2021-12-30 LAB — CULTURE, BLOOD (ROUTINE X 2)
Culture: NO GROWTH
Culture: NO GROWTH
Special Requests: ADEQUATE
Special Requests: ADEQUATE

## 2021-12-30 MED ORDER — ASPIRIN EC 81 MG PO TBEC
81.0000 mg | DELAYED_RELEASE_TABLET | Freq: Every day | ORAL | Status: DC
  Administered 2021-12-30 – 2022-01-04 (×6): 81 mg via ORAL
  Filled 2021-12-30 (×6): qty 1

## 2021-12-30 MED ORDER — ASPIRIN EC 81 MG PO TBEC: 81.0000 mg | DELAYED_RELEASE_TABLET | ORAL | Status: DC

## 2021-12-30 MED ORDER — CEFADROXIL 500 MG PO CAPS
500.0000 mg | ORAL_CAPSULE | Freq: Two times a day (BID) | ORAL | Status: DC
  Administered 2021-12-30 – 2022-01-01 (×4): 500 mg via ORAL
  Filled 2021-12-30 (×5): qty 1

## 2021-12-30 MED ORDER — MELATONIN 3 MG PO TABS
3.0000 mg | ORAL_TABLET | Freq: Every day | ORAL | Status: DC
  Administered 2021-12-30 – 2022-01-03 (×5): 3 mg via ORAL
  Filled 2021-12-30 (×5): qty 1

## 2021-12-30 NOTE — Progress Notes (Signed)
? ? ? ?  Subjective: ?Transferred to rehab unit. Doing very well. Very pleased with progress so far in terms of mobility. Pain well controlled. Denies distal n/t. No issues. ? ?Objective:  ? ?VITALS:   ?Vitals:  ? 12/29/21 1519 12/29/21 1933 12/30/21 0506 12/30/21 0755  ?BP: 121/78 (!) 144/61 (!) 178/88 140/70  ?Pulse: 91 (!) 103 60 84  ?Resp: $Remov'18 16 14 14  'KcRFsM$ ?Temp: 97.8 ?F (36.6 ?C) 98.4 ?F (36.9 ?C) 97.8 ?F (36.6 ?C) 98.5 ?F (36.9 ?C)  ?TempSrc: Oral Oral Oral Oral  ?SpO2:  100% 94% 100%  ?Weight: 64.3 kg     ?Height: $RemoveB'5\' 3"'npOJtNkl$  (1.6 m)     ? ? ?Sensation intact distally ?Intact pulses distally ?Dorsiflexion/Plantar flexion intact ?Incision: dressing C/D/I ?No cellulitis present ?Compartment soft ? ? ?Lab Results  ?Component Value Date  ? WBC 8.3 12/29/2021  ? HGB 11.0 (L) 12/29/2021  ? HCT 34.0 (L) 12/29/2021  ? MCV 100.0 12/29/2021  ? PLT 203 12/29/2021  ? ?BMET ?   ?Component Value Date/Time  ? NA 134 (L) 12/29/2021 0124  ? NA 135 09/25/2021 1206  ? K 3.7 12/29/2021 0124  ? CL 101 12/29/2021 0124  ? CO2 24 12/29/2021 0124  ? GLUCOSE 125 (H) 12/29/2021 0124  ? BUN 18 12/29/2021 0124  ? BUN 14 09/25/2021 1206  ? CREATININE 0.73 12/29/2021 0124  ? CALCIUM 8.6 (L) 12/29/2021 0124  ? EGFR 92 09/25/2021 1206  ? GFRNONAA >60 12/29/2021 0124  ? ? ?Xray: Total hip arthroplasty components in good position without adverse features. ? ?Assessment/Plan: ?   ? 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 ?Post op recs: ?WB: WBAT RLE, posterior hip precautions x6 weeks ?Abx: complete 7 day course of cefadroxil 500BID ?Imaging: PACU pelvis Xray ?Dressing: Aquacell, keep intact until follow up ?DVT prophylaxis: restart patient's aspirin $RemoveBeforeDEI'81mg'IimGBFaBUbHjEHho$  daily POD1, and plavix $RemoveBe'75mg'yDeXkrVGk$  daily POD2 ?Follow up: 2 weeks after surgery for a wound check with Dr. Zachery Dakins at [facility].  ?Address: [address] [place], [place], [place] [address]  ?Office Phone: [phone] ?  ? ? ? ? ?Deborah [name] A Shalia Bartko ?12/30/2021, 12:54  PM ? ? ?Charlies Constable, MD ? ?Contact information:   ?Weekdays 7am-5pm epic message Dr. Zachery Dakins, or call office for patient follow up: [phone] ?After hours and holidays please check [email or web address] for group call information for [facility] ? ?  ?

## 2021-12-30 NOTE — Discharge Summary (Signed)
Physician Discharge Summary  ?Patient ID: ?Deborah [name] ?MRN: [id number] ?DOB/AGE: July 28, 1951 71 y.o. ? ?Admit date: 12/29/2021 ?Discharge date: 01/04/2022 ? ?Discharge Diagnoses:  ?Principal Problem: ?  Avascular necrosis of bone of right hip ([place]) ?Active Problems: ?  Atherosclerosis of coronary artery without angina pectoris ?  Primary hypertension ?Active problems:  ?Debility secondary to right hip necrosis s/p surgical repair ?CAD ?COPD ?Hypothyroidism ?History of Scoliosis s/p surgical intervention ?History of Cervical spine fracture s/p surgical repair ?UTI ?Insomnia ? ?Discharged Condition: stable ? ?Significant Diagnostic Studies: ?DG Pelvis 1-2 Views ? ?Result Date: 12/25/2021 ?CLINICAL DATA:  Avascular necrosis RIGHT hip EXAM: PELVIS - 1-2 VIEW COMPARISON:  CT 12/23/2021 FINDINGS: There is severe erosion of the femoral head down to the femoral neck. Superior subluxation of the femur in relation to the acetabulum. The acetabulum is carved out and expanded. Hardware of posterior lumbar fusion and sacroiliac fusion. IMPRESSION: Severe erosive arthropathy of the RIGHT femoral head with superior subluxation. Electronically Signed   By: Suzy Bouchard M.D.   On: 12/25/2021 12:42  ? ?CT LUMBAR SPINE WO CONTRAST ? ?Result Date: 12/23/2021 ?CLINICAL DATA:  Right hip pain EXAM: CT LUMBAR SPINE WITHOUT CONTRAST TECHNIQUE: Multidetector CT imaging of the lumbar spine was performed without intravenous contrast administration. Multiplanar CT image reconstructions were also generated. RADIATION DOSE REDUCTION: This exam was performed according to the departmental dose-optimization program which includes automated exposure control, adjustment of the mA and/or kV according to patient size and/or use of iterative reconstruction technique. COMPARISON:  MR lumbar spine 12/14/2021 FINDINGS: Extensive posterior spinal fusion hardware resulting in beam hardening artifact partially obscuring the adjacent soft tissue and osseous  structures. Segmentation: 5 lumbar type vertebrae. Alignment: Minimal grade 1 anterolisthesis of L5 on S1. Vertebrae: No acute fracture or aggressive osseous lesion. Paraspinal and other soft tissues: Negative. Other: None Disc levels: Disc spaces: Posterior lumbar fusion from T12 through S1 with bilateral iliac screws and interbody fusion from L1-2 through L5-S1. no hardware failure or complication. T12-L1: Partial interbody fusion. No definite foraminal or central canal stenosis. L1-L2: Interbody and posterior element fusion. Neural foramen and central canal are obscured by beam hardening artifact. L2-L3: Interbody and posterior element fusion. Neural foramen and central canal are obscured by beam hardening artifact. L3-L4: Interbody and posterior element fusion. Central canal is obscured by beam hardening artifact. No foraminal stenosis. L4-L5: Interbody and posterior element fusion. Central canal is obscured by beam hardening artifact. No foraminal stenosis. L5-S1: Interbody and posterior element fusion. Central canal is obscured by beam hardening artifact. Left foraminal narrowing. IMPRESSION: 1. Extensive posterior spinal fusion hardware resulting in beam hardening artifact partially obscuring the adjacent soft tissue and osseous structures. No hardware failure or complication. 2. No acute fracture or subluxation of the lumbar spine. Electronically Signed   By: Kathreen Devoid M.D.   On: 12/23/2021 11:24  ? ?CT PELVIS WO CONTRAST ? ?Result Date: 12/24/2021 ?CLINICAL DATA:  There is moderate lateral and mild anterior subluxation of the proximal femur with respect to the right acetabulum with chronic high-grade erosions of the superior aspect of the femoral head and diffusely throughout the acetabulum. EXAM: CT PELVIS WITHOUT CONTRAST TECHNIQUE: Multidetector CT imaging of the pelvis was performed following the standard protocol without intravenous contrast. RADIATION DOSE REDUCTION: This exam was performed  according to the departmental dose-optimization program which includes automated exposure control, adjustment of the mA and/or kV according to patient size and/or use of iterative reconstruction technique. COMPARISON:  Right hip radiographs 07/25/2019 FINDINGS:  Urinary Tract: There is only mild distention of the urinary bladder without focal wall thickening seen. The terminal ileum is unremarkable. Bowel: The terminal ileum is unremarkable. No dilated loops of bowel to indicate bowel obstruction. Vascular/Lymphatic: No pathologically enlarged lymph nodes. No significant vascular abnormality seen. Reproductive: The uterus appears to be surgically absent. No mass or other significant abnormality Other:  None. Musculoskeletal: There is diffuse decreased bone mineralization. High-grade erosion of the superior 50-60% of the right femoral head, likely related to chronic lateral greater than anterior subluxation of the proximal femur with respect to the right acetabulum. The eroded superior aspect of the proximal right femur nearly abuts the lateral aspect of the right acetabulum. There is diffuse moderate acetabular erosion/enlargement. There are numerous small chronic ossicles seen within the right femoroacetabular joint and extending lateral to the right acetabulum. These may be secondary to the bone erosion and raise the question of synovial osteochondromatosis. Moderate left femoroacetabular joint space narrowing with mild peripheral degenerative osteophytosis. Mild pubic symphysis joint space narrowing. Within the limitations of diffuse decreased bone mineralization, no definite acute fracture is seen. Postsurgical changes are seen of spinal posterior fusion hardware extending from the superior plane of view into the visualized L4 through S1 levels with associated L4-5 and L5-S1 intervertebral disc spacers. Additional bilateral iliac screws. No definite perihardware lucency is seen to indicate hardware loosening.  IMPRESSION: Moderate lateral and mild anterior subluxation of the proximal femur with respect to the right acetabulum with high-grade proximal femoral erosion and moderate diffuse acetabular erosion. Numerous small partially ossified ossicles throughout the right femoroacetabular joint raise the question of synovial osteochondromatosis contributing to the chronic erosive changes. No acute fracture is seen. Mild left femoroacetabular osteoarthritis. Electronically Signed   By: Yvonne Kendall M.D.   On: 12/24/2021 18:39  ? ?MR LUMBAR SPINE WO CONTRAST ? ?Result Date: 12/16/2021 ?CLINICAL DATA:  Low back and right leg pain and weakness. The patient is unable to bear weight. History of prior surgery x2. EXAM: MRI LUMBAR SPINE WITHOUT CONTRAST TECHNIQUE: Multiplanar, multisequence MR imaging of the lumbar spine was performed. No intravenous contrast was administered. COMPARISON:  None. FINDINGS: Segmentation:  Standard. Alignment:  Trace anterolisthesis L5 on S1. Vertebrae: No acute fracture, evidence of discitis, or bone lesion. Very mild, remote superior endplate compression fracture of L1 is seen. The patient is status post fusion from at least T11 through S1. Fusion hardware extends above the superior margin of the scan. Conus medullaris and cauda equina: Conus extends to the L1 level. The conus and cauda equina are very difficult to visualize due to patient motion and artifact from spinal hardware despite using artifact reduction techniques. Paraspinal and other soft tissues: Negative. Disc levels: As noted above, the study is markedly limited due to patient motion and artifact spinal fusion hardware. In particular, the axial images are extremely limited. No central canal or foraminal stenosis is seen on any of the images provided. IMPRESSION: Markedly limited study due to spinal fusion hardware and patient motion. No fracture or other acute abnormality is identified. Although visualization is poor, no central canal  or foraminal stenosis is identified. Electronically Signed   By: Inge Rise M.D.   On: 12/16/2021 07:27  ? ?[place] DRAIN/INJ MAJOR JOINT/BURSA ? ?Result Date: 12/25/2021 ?INDICATION: Avascular necrosis of the ri
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
## 2021-12-30 NOTE — Progress Notes (Signed)
Occupational Therapy Session Note ? ?Patient Details  ?Name: Deborah [name] ?MRN: [id number] ?Date of Birth: 23-Apr-1951 ? ?Today's Date: 12/30/2021 ?OT Individual Time: [id number] ?OT Individual Time Calculation (min): 30 min  ? ? ?Short Term Goals: ?Week 1:  OT Short Term Goal 1 (Week 1): STG = LTG 2/2 ELOS ? ?Skilled Therapeutic Interventions/Progress Updates:  ?Skilled OT intervention completed with focus on AE education. Pt received seated EOB, no c/o pain. Requested to change bra and shirt, with pt able to doff/donn with set up A. With education and demonstration provided, pt return demonstrated the ability to donn R sock with sock aid, R shoe with LH shoe horn, and thread BLEs into underwear with min A needed for technique and strategies. Provided resources for pt to purchase hip kit for increased independence at home. Good demonstration of hip precautions, as well as pt able to accurately recall 3/3 precautions this session. Retrieved ice per pt request for R hip and for beverage. Pt was left seated EOB per request, with bed alarm on and all needs in reach at end of session. ? ? ?Therapy Documentation ?Precautions:  ?Precautions ?Precautions: Posterior Hip, Fall ?Restrictions ?Weight Bearing Restrictions: Yes ?RLE Weight Bearing: Weight bearing as tolerated ? ? ? ?Therapy/Group: Individual Therapy ? ?Ruthy Forry E Julaine Zimny ?12/30/2021, 7:33 AM ?

## 2021-12-30 NOTE — Progress Notes (Signed)
?                                                       PROGRESS NOTE ? ? ?Subjective/Complaints: ? ?Pt reports poor sleep- was restless- doesn't want anything "Strong" for sleep- willing to try Melatonin,  ?LBM was diarrhea-  ?Peeing all night, so purewick was started.  ? ?ROS: ? ?Pt denies SOB, abd pain, CP, N/V/C/D, and vision changes ? ? ?Objective: ?  ?No results found. ?Recent Labs  ?  12/28/21 ?0151 12/29/21 ?0124  ?WBC 10.6* 8.3  ?HGB 12.4 11.0*  ?HCT 37.4 34.0*  ?PLT 239 203  ? ?Recent Labs  ?  12/28/21 ?0151 12/29/21 ?0124  ?NA 134* 134*  ?K 3.6 3.7  ?CL 103 101  ?CO2 26 24  ?GLUCOSE 139* 125*  ?BUN 18 18  ?CREATININE 0.66 0.73  ?CALCIUM 8.9 8.6*  ? ? ?Intake/Output Summary (Last 24 hours) at 12/30/2021 1822 ?Last data filed at 12/30/2021 1335 ?Gross per 24 hour  ?Intake 600 ml  ?Output 975 ml  ?Net -375 ml  ?  ? ?  ? ?Physical Exam: ?Vital Signs ?Blood pressure 125/76, pulse 97, temperature 98 ?F (36.7 ?C), temperature source Oral, resp. rate 18, height 5\' 3"  (1.6 m), weight 64.3 kg, SpO2 96 %. ? ? ?General: awake, alert, appropriate, sitting up in bed; NAD ?HENT: conjugate gaze; oropharynx moist ?CV: regular rate; no JVD ?Pulmonary: CTA B/L; no W/R/R- good air movement ?GI: soft, NT, ND, (+)BS ?Psychiatric: appropriate; bright affect ?Neurological: Ox3 ?Musculoskeletal:     ?   General: No swelling or tenderness.  ?   Comments: THoraco lumbar incision instumentation protruding subcutaneously along the thoracic and lumbar spine  ?Skin: ?   General: Skin is warm and dry.  ?   Comments: Right hip incision with post op dressing   ?Neurological:  ?   General: No focal deficit present.  ?   Mental Status: She is alert and oriented to person, place, and time.  ?   Comments: 5/5 in BUE and LLE ?3- Left knee ext 5/5/ ankle Df/PF, HF too painful  ? ?Assessment/Plan: ?1. Functional deficits which require 3+ hours per day of interdisciplinary therapy in a comprehensive inpatient rehab setting. ?Physiatrist is providing  close team supervision and 24 hour management of active medical problems listed below. ?Physiatrist and rehab team continue to assess barriers to discharge/monitor patient progress toward functional and medical goals ? ?Care Tool: ? ?Bathing ?   ?Body parts bathed by patient: Right arm, Left arm, Chest, Abdomen, Front perineal area, Buttocks, Right upper leg, Left upper leg, Face  ? Body parts bathed by helper: Right lower leg, Left lower leg ?  ?  ?Bathing assist Assist Level: Minimal Assistance - Patient > 75% ?  ?  ?Upper Body Dressing/Undressing ?Upper body dressing   ?What is the patient wearing?: Bra, Pull over shirt ?   ?Upper body assist Assist Level: Set up assist ?   ?Lower Body Dressing/Undressing ?Lower body dressing ? ? ?   ?What is the patient wearing?: Underwear/pull up, Pants ? ?  ? ?Lower body assist Assist for lower body dressing: Moderate Assistance - Patient 50 - 74% ?   ? ?Toileting ?Toileting    ?Toileting assist Assist for toileting: Minimal Assistance - Patient > 75% ?  ?  ?  Transfers ?Chair/bed transfer ? ?Transfers assist ? Chair/bed transfer activity did not occur: N/A ? ?Chair/bed transfer assist level: Contact Guard/Touching assist ?Chair/bed transfer assistive device: Walker ?  ?Locomotion ?Ambulation ? ? ?Ambulation assist ? ?   ? ?Assist level: Contact Guard/Touching assist ?Assistive device: Walker-rolling ?Max distance: 58 ft  ? ?Walk 10 feet activity ? ? ?Assist ?   ? ?Assist level: Contact Guard/Touching assist ?Assistive device: Walker-rolling  ? ?Walk 50 feet activity ? ? ?Assist   ? ?Assist level: Contact Guard/Touching assist ?Assistive device: Walker-rolling  ? ? ?Walk 150 feet activity ? ? ?Assist Walk 150 feet activity did not occur: Safety/medical concerns ? ?  ?  ?  ? ?Walk 10 feet on uneven surface  ?activity ? ? ?Assist Walk 10 feet on uneven surfaces activity did not occur: Safety/medical concerns ? ? ?  ?   ? ?Wheelchair ? ? ? ? ?Assist Is the patient using a  wheelchair?: Yes ?Type of Wheelchair: Manual ?  ? ?Wheelchair assist level: Supervision/Verbal cueing ?Max wheelchair distance: >100 ft  ? ? ?Wheelchair 50 feet with 2 turns activity ? ? ? ?Assist ? ?  ?  ? ? ?Assist Level: Supervision/Verbal cueing  ? ?Wheelchair 150 feet activity  ? ? ? ?Assist ?   ? ? ?Assist Level: Moderate Assistance - Patient 50 - 74%  ? ?Blood pressure 125/76, pulse 97, temperature 98 ?F (36.7 ?C), temperature source Oral, resp. rate 18, height 5\' 3"  (1.6 m), weight 64.3 kg, SpO2 96 %. ? ?Medical Problem List and Plan: ?1. Functional deficits secondary to Right hip avascular necrosis , s/p THR, post approach ?            -patient may  shower 4d post op with wound covered ?            -ELOS/Goals: 10-12d Mod I goals ? First day of evaluations- con't CIR- PT and OT ?2.  Antithrombotics: ?-DVT/anticoagulation:  Pharmaceutical: Lovenox ?            -antiplatelet therapy: ASA 81mg  /day  ?3. Pain Management: Hydrocodone mod/sev pain, tylenol for mild, on Gabapentin chronic neuropathic pain ? 5/1- pain controlled- con't regimen ?4. Mood: on hight dose zoloft as well as dextro amphetamine ? 5/1- will start Melatonin for sleep 3 mg QHS ?            -antipsychotic agents: none ?5. Neuropsych: This patient is capable of making decisions on her own behalf. ?6. Skin/Wound Care: routine skin care , post op dressing x 7d then may remove and replace with foam dressing  ?7. Fluids/Electrolytes/Nutrition: regular diet monitor I an Os, CMET in am  ? 5/1- will give purewick at night for now and check PVRs q6 hours- x10 episodes, unless retaining.  ?8.  Hx Scoliosis s/p T2-S1 fusion ?9.  Hx cervical spine fx without SCI s/p C1-2 fusion  ?10.  Hx CAD s/p cardiac stent ASA and Plavix daily  ?  ? ?LOS: ?1 days ?A FACE TO FACE EVALUATION WAS PERFORMED ? ?Deborah [name] ?12/30/2021, 6:22 PM  ? ? ? ?
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
## 2021-12-30 NOTE — Progress Notes (Signed)
Inpatient Rehabilitation Center ?Individual Statement of Services ? ?Patient Name:  Deborah [name]  ?Date:  12/30/2021 ? ?Welcome to the [facility].  Our goal is to provide you with an individualized program based on your diagnosis and situation, designed to meet your specific needs.  With this comprehensive rehabilitation program, you will be expected to participate in at least 3 hours of rehabilitation therapies Monday-Friday, with modified therapy programming on the weekends. ? ?Your rehabilitation program will include the following services:  Physical Therapy (PT), Occupational Therapy (OT), 24 hour per day rehabilitation nursing, Care Coordinator, Rehabilitation Medicine, Nutrition Services, and Pharmacy Services ? ?Weekly team conferences will be held on Tuesday to discuss your progress.  Your Inpatient Rehabilitation Care Coordinator will talk with you frequently to get your input and to update you on team discussions.  Team conferences with you and your family in attendance may also be held. ? ?Expected length of stay: 7-10 days  Overall anticipated outcome: Independent with device ? ?Depending on your progress and recovery, your program may change. Your Inpatient Rehabilitation Care Coordinator will coordinate services and will keep you informed of any changes. Your Inpatient Rehabilitation Care Coordinator's name and contact numbers are listed  below. ? ?The following services may also be recommended but are not provided by the [facility]:  ?Driving Evaluations ?Home Health Rehabiltiation Services ?Outpatient Rehabilitation Services ? ?  ?Arrangements will be made to provide these services after discharge if needed.  Arrangements include referral to agencies that provide these services. ? ?Your insurance has been verified to be:  Medicare A & B ?Your primary doctor is:  Kevan Ny ? ?Pertinent information will be shared with your doctor and your insurance  company. ? ?Inpatient Rehabilitation Care Coordinator:  Ovidio Kin, [place] or (C) [phone] ? ?Information discussed with and copy given to patient by: Elease Hashimoto, 12/30/2021, 1:21 PM    ?

## 2021-12-30 NOTE — Progress Notes (Signed)
Inpatient Rehabilitation  Patient information reviewed and entered into eRehab system by Taria Castrillo M. Maniah Nading, M.A., CCC/SLP, PPS Coordinator.  Information including medical coding, functional ability and quality indicators will be reviewed and updated through discharge.    

## 2021-12-31 ENCOUNTER — Encounter (HOSPITAL_COMMUNITY): Payer: Self-pay | Admitting: Orthopedic Surgery

## 2021-12-31 DIAGNOSIS — M87051 Idiopathic aseptic necrosis of right femur: Secondary | ICD-10-CM | POA: Diagnosis not present

## 2021-12-31 LAB — SURGICAL PATHOLOGY

## 2021-12-31 MED ORDER — MAGNESIUM OXIDE -MG SUPPLEMENT 400 (240 MG) MG PO TABS
200.0000 mg | ORAL_TABLET | Freq: Two times a day (BID) | ORAL | Status: DC
  Administered 2021-12-31 – 2022-01-03 (×6): 200 mg via ORAL
  Filled 2021-12-31 (×7): qty 1

## 2021-12-31 MED ORDER — OYSTER SHELL CALCIUM/D3 500-5 MG-MCG PO TABS
1.0000 | ORAL_TABLET | Freq: Two times a day (BID) | ORAL | Status: DC
  Administered 2021-12-31 – 2022-01-03 (×7): 1 via ORAL
  Filled 2021-12-31 (×7): qty 1

## 2021-12-31 NOTE — Progress Notes (Signed)
Occupational Therapy Session Note ? ?Patient Details  ?Name: Deborah [name] ?MRN: [id number] ?Date of Birth: [birth year] ? ?Today's Date: 12/31/2021 ?OT Individual Time: 4098-1191 ?OT Individual Time Calculation (min): 55 min  ? ? ?Short Term Goals: ?Week 1:  OT Short Term Goal 1 (Week 1): STG = LTG 2/2 ELOS ? ?Skilled Therapeutic Interventions/Progress Updates:  ?Skilled OT intervention completed with focus on toileting/transfers, functional ambulatory endurance, posterior hip precaution adherence. Pt received upright in bed, with unrated elevated pain in R knee/hip, pre-medicated with ice on- therapist providing rest breaks and repositioning as needed for pain management during session. ? ?Completed bed mobility with min A for RLE management 2/2 pain, sit > stand with supervision using RW. Cues needed on several occassions during transfers and functional tasks to prevent hip IR and flexion >90 with education provided for how to modify her environment i.e. raising bed height/scooting hips forward prior standing, propping leg out prior to sitting. Completed ambulatory transfer with CGA using RW to bathroom, with pt requiring min A for toileting tasks. Pt continent of loose BM, nurse and MD aware of frequent loose bowels. Performed posterior pericare seated with supervision. Standing at sink, pt washed hands with supervision. Pt did state that the meds and BM have caused her to feel a little weak today, however still demonstrated great mobility. ? ?To promote precaution adherence, and ambulatory endurance, pt ambulated about 45 ft using including 3 turns using RW with supervision assist. Education provided about preventing IR of hip with turns as pt showed 2 instances during turns of increased IR hip rotation with safety cues needed. Pt was left seated in w/c per preference for lunch, with chair alarm on and all needs in reach at end of session. ? ? ?Therapy Documentation ?Precautions:  ?Precautions ?Precautions:  Posterior Hip, Fall ?Precaution Comments: patient able to teach back 3/3 posterior hip precautions ?Restrictions ?Weight Bearing Restrictions: Yes ?RLE Weight Bearing: Weight bearing as tolerated ? ? ? ? ?Therapy/Group: Individual Therapy ? ?Hector Venne E Masa Lubin ?12/31/2021, 7:49 AM ?

## 2021-12-31 NOTE — Progress Notes (Signed)
Patient ID: Deborah [name], female   DOB: 1951-07-08, 71 y.o.   MRN: [id number] Met with pt and spoke with daughter via telephone to discuss team conference goals of mod/I level and target discharge date of 5/6. Pt feels she is doing well and requests home health over OPPT due to transport and being able to work on tasks within her home. Will get list of [place] agencies for she and daughter to look over and tell worker preference. Continue to work on discharge needs. ?

## 2021-12-31 NOTE — Discharge Instructions (Addendum)
Inpatient Rehab Discharge Instructions ? ?Deborah [name] ?Discharge date and time: 01/04/2022 ? ?Activities/Precautions/ Functional Status: ?Activity: no lifting, driving, or strenuous exercise until cleared by MD ?Diet: cardiac diet ?Wound Care: keep wound clean and dry. Contact Dr. Ander Slade if you develop any problems with your incision/wound--redness, swelling, increase in pain, drainage or if you develop fever or chills.   ? ? ?Functional status:  ?___ No restrictions     ___ Walk up steps independently ?___ 24/7 supervision/assistance   ___ Walk up steps with assistance ?_X__ Intermittent supervision/assistance  ___ Bathe/dress independently ?___ Walk with walker     ___ Bathe/dress with assistance ?___ Walk Independently    ___ Shower independently ?___ Walk with assistance    _ X__ Shower with assistance ?_X_ No alcohol     ___ Return to work/school ________ ? ? ?Special Instructions: ?No driving, alcohol consumption or tobacco use.  ?2.  Maintain Right total hip precautions: Do not bend, rotate leg inwards or cross your leg, do not bed leg at 90 or over 90 degrees.  ? ? ? ? ?COMMUNITY REFERRALS UPON DISCHARGE:   ? ?Home Health:   PT & OT   ?               [place] Phone:[phone]  ? ?Medical Equipment/Items Ordered:HAS ROLLATOR, ROLLING WALKER AND WHEELCHAIR AT HOME-WILL GET NEW ROLLATOR ON OWN ?                                                Agency/Supplier:NA ? ? ?My questions have been answered and I understand these instructions. I will adhere to these goals and the provided educational materials after my discharge from the hospital. ? ?Patient/Caregiver Signature _______________________________ Date __________ ? ?Clinician Signature _______________________________________ Date __________ ? ?Please bring this form and your medication list with you to all your follow-up doctor's appointments.   ?

## 2021-12-31 NOTE — Patient Care Conference (Signed)
Inpatient RehabilitationTeam Conference and Plan of Care Update ?Date: 12/31/2021  Time: 11:53 AM  ? ? ?Patient Name: Deborah [name]      ?Medical Record Number: [id number]  ?Date of Birth: 10/08/1950 ?Sex: Female         ?Room/Bed: 4W10C/4W10C-01 ?Payor Info: Payor: MEDICARE / Plan: MEDICARE PART A AND B / Product Type: *No Product type* /   ? ?Admit Date/Time:  12/29/2021  2:31 PM ? ?Primary Diagnosis:  Avascular necrosis of bone of right hip ([place]) ? ?Hospital Problems: Principal Problem: ?  Avascular necrosis of bone of right hip ([place]) ? ? ? ?Expected Discharge Date: Expected Discharge Date: 01/04/22 ? ?Team Members Present: ?Physician leading conference: Dr. Courtney Heys ?Social Worker Present: Ovidio Kin, LCSW ?Nurse Present: Dorthula Nettles, RN ?PT Present: Ailene Rud, PT ?OT Present: Other (comment);Jennifer Tamala Julian, [place] Danae Chen, [place]) ?PPS Coordinator present : Gunnar Fusi, SLP ? ?   Current Status/Progress Goal Weekly Team Focus  ?Bowel/Bladder ? ? Continent of B/B. LBM 5/1  Remain continent  Assist with toileting needs as needed   ?Swallow/Nutrition/ Hydration ? ?           ?ADL's ? ? CGA for toilet transfers, setup to min A for bathing and dressing; standing balance CGA  mod I  AE education, d/c planning functional endurance   ?Mobility ? ? Min A bed mobility, CGA gait and transfers, per caretool  mod I transfers and home ambulation, supervision >150 ft gait and stairs  gait, transfers, pain management   ?Communication ? ?           ?Safety/Cognition/ Behavioral Observations ?           ?Pain ? ? 3-5/10 Pain to right hip.  Pain <3/10  Assess Qshift and prn   ?Skin ? ? Surgical incison to right hip, covered with hydrocolloide dressing  No new breakdowns  Assess Qshift and prn   ? ? ?Discharge Planning:  ?Pt had assist per neighbors and church members, along with children checking in on. Hopefully can get mod/i level by DC.   ?Team Discussion: ?Lovenox discontinued by other provider, ordered 81 mg ASA, not  adequate. Please keep patient moving to prevent DVT's. Continent B/B, reports 5/10 hip pain. Norco available. Right hip incision clean, no s/s infection. Has purchased Hip kit from gift shop. Discharging home with assist from neighbor and church members. Will need outpatient therapy. ? ?Patient on target to meet rehab goals: ?yes, mod I goals. Currently min assist bed mobility, supervision OOB, CGA gait with RW. ? ?*See Care Plan and progress notes for long and short-term goals.  ? ?Revisions to Treatment Plan:  ?Adjusting medications. ? ?Teaching Needs: ?Family education, medication/pain management, skin/wound care, transfer/gait training, safety awareness, etc. ?  ?Current Barriers to Discharge: ?Decreased caregiver support, Home enviroment access/layout, Wound care, Lack of/limited family support, and Weight bearing restrictions ? ?Possible Resolutions to Barriers: ?Family education ?Follow-up therapy ?Order recommended DME ?  ? ? Medical Summary ?Current Status: on ASA not lovenox- is also on Plavix-continent- pain intermittent- Norco prn; hip incision looks OK ? Barriers to Discharge: Home enviroment access/layout;Decreased family/caregiver support;Medical stability;Weight bearing restrictions;Wound care;Weight ? Barriers to Discharge Comments: going home with neighbor/church helping- ?Possible Resolutions to [employer] Focus: CGA gait/transfers- moves really well- won't need W/C- only RW- outpatient therapy- to get hip kit- s/u with OT- Saturday 5/6 ? ? ?Continued Need for Acute Rehabilitation Level of Care: The patient requires daily medical management by a physician with  specialized training in physical medicine and rehabilitation for the following reasons: ?Direction of a multidisciplinary physical rehabilitation program to maximize functional independence : Yes ?Medical management of patient stability for increased activity during participation in an intensive rehabilitation regime.: Yes ?Analysis of  laboratory values and/or radiology reports with any subsequent need for medication adjustment and/or medical intervention. : Yes ? ? ?I attest that I was present, lead the team conference, and concur with the assessment and plan of the team. ? ? ?Dorthula Nettles G ?01/01/2022, 11:16 AM  ? ? ? ? ? ? ?

## 2021-12-31 NOTE — Progress Notes (Signed)
Physical Therapy Session Note ? ?Patient Details  ?Name: Deborah [name] ?MRN: [id number] ?Date of Birth: 1951-08-14 ? ?Today's Date: 12/31/2021 ?PT Individual Time: [id number] ?PT Individual Time Calculation (min): 75 min  ? ?Short Term Goals: ?Week 1:  PT Short Term Goal 1 (Week 1): STG=LTG due to ELOS ? ?Skilled Therapeutic Interventions/Progress Updates:  ?  pt received in bed and agreeable to therapy. Pt reports 5/10 pain in her hip, RN provided tylenol during session. Pt noted to be hyperverbal and required mod-max redirection at times d/t receiving adderall later than usual. Supine>sit with supervision for hip precautions and increased time. Pt requested to use the bathroom. ambulatory transfer with RW and CGA to bathroom, supervision for 3/3 toileting tasks, but pt requested check for thoroughness. Hand hygiene in standing. Pt required extended time to clean her hearing aids, swap glasses, and wipe RW and other items down. Pt propelled w/c with BUE x ~ 100 ft before needing to use bathroom again. Transported back to room for second continent bowel void with same level of assist. Pt ambulated 3 x ~75 ft with CGA fading to supervision. Pt required rest breaks d/t fatigue and pain. Pt returned to bed with supervision Stand pivot transfer with RW, sit>supine with min A for RLE management. Pt was left with all needs in reach and alarm active.  ? ? ?Therapy Documentation ?Precautions:  ?Precautions ?Precautions: Posterior Hip, Fall ?Precaution Comments: patient able to teach back 3/3 posterior hip precautions ?Restrictions ?Weight Bearing Restrictions: Yes ?RLE Weight Bearing: Weight bearing as tolerated ?General: ?  ? ? ? ? ?Therapy/Group: Individual Therapy ? ?[place] ?12/31/2021, 11:10 AM  ?

## 2021-12-31 NOTE — Progress Notes (Signed)
Physical Therapy Session Note ? ?Patient Details  ?Name: Deborah [name] ?MRN: [id number] ?Date of Birth: [birth year] ? ?Today's Date: 12/31/2021 ?PT Individual Time: [id number] ?PT Individual Time Calculation (min): 80 min  ? ?Short Term Goals: ?Week 1:  PT Short Term Goal 1 (Week 1): STG=LTG due to ELOS ? ?Skilled Therapeutic Interventions/Progress Updates:  ? Session focused on functional gait, introduced therex HEP for RLE strengthening and ROM, and performed gait outside for community reintegration. Pt performs functional transfers with RW throughout session at supervision to CGA overall with good adherence to precautions. Gait training with RW with focus on step through pattern and increasing functional WB through RLE which demonstrates improvement x 120'. In gym practiced bed mobility on flat mat and demonstrated use of leg lifter which pt was able to perform at supervision level with cues for technique. Instructed in the below exercises and issued handout as well. Cues for technique and for exhale on exertion.  ? ?Access Code: [email or web address] ?URL: [email or web address] ?Date: 12/31/2021 ?Prepared by: Jill Side ? ?Exercises ?- Hip Flexion  - 1 x daily - 7 x weekly - 3 sets - 10 reps ?- Supine Hip Abduction AROM  - 1 x daily - 7 x weekly - 3 sets - 10 reps ?- Seated Long Arc Quad  - 1 x daily - 7 x weekly - 3 sets - 10 reps - 5 hold ?- Standing Hip Extension with Counter Support  - 1 x daily - 7 x weekly - 3 sets - 10 reps ?- Standing Marching  - 1 x daily - 7 x weekly - 3 sets - 10 reps ?- Standing Heel Raise with Support  - 1 x daily - 7 x weekly - 3 sets - 10 reps ? ? ?Short distance gait trial without AD x 10' with overall min assist and antalgic gait pattern. Transported outside for time management and performed functional gait over uneven surfaces and inclines including navigating doorways and over rugs about 120' until fatigued. End of session transferred back to bed without device with CGA and then  decided to sit up to finish her lunch in the w/c and transferred back with CGA. All needs in reach.  ? ?Therapy Documentation ?Precautions:  ?Precautions ?Precautions: Posterior Hip, Fall ?Precaution Comments: patient able to teach back 3/3 posterior hip precautions ?Restrictions ?Weight Bearing Restrictions: Yes ?RLE Weight Bearing: Weight bearing as tolerated ? ?Pain: ?No complaints of pain at rest. Does report soreness with therex and movement - educated on use of ice after therapies as well.  ? ? ? ?Therapy/Group: Individual Therapy ? ?Tedd Sias ?12/31/2021, 3:17 PM  ?

## 2021-12-31 NOTE — Progress Notes (Signed)
?                                                       PROGRESS NOTE ? ? ?Subjective/Complaints: ? ?Pt reports doing better today- sounds like slept better.  ?Had to take pain pill last night, which is unusual for her.  ?Having BM this AM- on BSC. ? ?ROS: ? ?Pt denies SOB, abd pain, CP, N/V/C/D, and vision changes ? ? ?Objective: ?  ?No results found. ?Recent Labs  ?  12/29/21 ?0124  ?WBC 8.3  ?HGB 11.0*  ?HCT 34.0*  ?PLT 203  ? ?Recent Labs  ?  12/29/21 ?0124  ?NA 134*  ?K 3.7  ?CL 101  ?CO2 24  ?GLUCOSE 125*  ?BUN 18  ?CREATININE 0.73  ?CALCIUM 8.6*  ? ? ?Intake/Output Summary (Last 24 hours) at 12/31/2021 1027 ?Last data filed at 12/31/2021 8099 ?Gross per 24 hour  ?Intake 720 ml  ?Output --  ?Net 720 ml  ?  ? ?  ? ?Physical Exam: ?Vital Signs ?Blood pressure (!) 158/84, pulse 100, temperature 98.3 ?F (36.8 ?C), temperature source Oral, resp. rate 18, height 5\' 3"  (1.6 m), weight 64.3 kg, SpO2 99 %. ? ? ? ?General: awake, alert, appropriate, on BSC: NT in room; NAD ?HENT: conjugate gaze; oropharynx moist ?CV: regular rate; no JVD ?Pulmonary: CTA B/L; no W/R/R- good air movement ?GI: soft, NT, ND, (+)BS- hypoactive ?Psychiatric: appropriate ?Neurological: Ox3 ? ?Musculoskeletal:     ?   General: No swelling or tenderness.  ?   Comments: THoraco lumbar incision instumentation protruding subcutaneously along the thoracic and lumbar spine  ?Skin: ?   General: Skin is warm and dry.  ?   Comments: Right hip incision with post op dressing   ?Neurological:  ?   General: No focal deficit present.  ?   Mental Status: She is alert and oriented to person, place, and time.  ?   Comments: 5/5 in BUE and LLE ?3- Left knee ext 5/5/ ankle Df/PF, HF too painful  ? ?Assessment/Plan: ?1. Functional deficits which require 3+ hours per day of interdisciplinary therapy in a comprehensive inpatient rehab setting. ?Physiatrist is providing close team supervision and 24 hour management of active medical problems listed below. ?Physiatrist  and rehab team continue to assess barriers to discharge/monitor patient progress toward functional and medical goals ? ?Care Tool: ? ?Bathing ?   ?Body parts bathed by patient: Right arm, Left arm, Chest, Abdomen, Front perineal area, Buttocks, Right upper leg, Left upper leg, Face  ? Body parts bathed by helper: Right lower leg, Left lower leg ?  ?  ?Bathing assist Assist Level: Minimal Assistance - Patient > 75% ?  ?  ?Upper Body Dressing/Undressing ?Upper body dressing   ?What is the patient wearing?: Hospital gown only ?   ?Upper body assist Assist Level: Minimal Assistance - Patient > 75% ?   ?Lower Body Dressing/Undressing ?Lower body dressing ? ? ?   ?What is the patient wearing?: Underwear/pull up ? ?  ? ?Lower body assist Assist for lower body dressing: Moderate Assistance - Patient 50 - 74% ?   ? ?Toileting ?Toileting    ?Toileting assist Assist for toileting: Minimal Assistance - Patient > 75% ?  ?  ?Transfers ?Chair/bed transfer ? ?Transfers assist ? Chair/bed transfer activity did not occur:  N/A ? ?Chair/bed transfer assist level: Contact Guard/Touching assist ?Chair/bed transfer assistive device: Walker ?  ?Locomotion ?Ambulation ? ? ?Ambulation assist ? ?   ? ?Assist level: Contact Guard/Touching assist ?Assistive device: Walker-rolling ?Max distance: 58 ft  ? ?Walk 10 feet activity ? ? ?Assist ?   ? ?Assist level: Contact Guard/Touching assist ?Assistive device: Walker-rolling  ? ?Walk 50 feet activity ? ? ?Assist   ? ?Assist level: Contact Guard/Touching assist ?Assistive device: Walker-rolling  ? ? ?Walk 150 feet activity ? ? ?Assist Walk 150 feet activity did not occur: Safety/medical concerns ? ?  ?  ?  ? ?Walk 10 feet on uneven surface  ?activity ? ? ?Assist Walk 10 feet on uneven surfaces activity did not occur: Safety/medical concerns ? ? ?  ?   ? ?Wheelchair ? ? ? ? ?Assist Is the patient using a wheelchair?: Yes ?Type of Wheelchair: Manual ?  ? ?Wheelchair assist level: Supervision/Verbal  cueing ?Max wheelchair distance: >100 ft  ? ? ?Wheelchair 50 feet with 2 turns activity ? ? ? ?Assist ? ?  ?  ? ? ?Assist Level: Supervision/Verbal cueing  ? ?Wheelchair 150 feet activity  ? ? ? ?Assist ?   ? ? ?Assist Level: Moderate Assistance - Patient 50 - 74%  ? ?Blood pressure (!) 158/84, pulse 100, temperature 98.3 ?F (36.8 ?C), temperature source Oral, resp. rate 18, height 5\' 3"  (1.6 m), weight 64.3 kg, SpO2 99 %. ? ?Medical Problem List and Plan: ?1. Functional deficits secondary to Right hip avascular necrosis , s/p THR, post approach ?            -patient may  shower 4d post op with wound covered ?            -ELOS/Goals: 10-12d Mod I goals ? Continue CIR- PT, OT - team conference today to determine length of stay ?2.  Antithrombotics: ?-DVT/anticoagulation:  Pharmaceutical: Lovenox ?5/2- Ortho Surgery stopped Lovenox- and put on ASA daily and Plavix-  ?            -antiplatelet therapy: ASA 81mg  /day  ?3. Pain Management: Hydrocodone mod/sev pain, tylenol for mild, on Gabapentin chronic neuropathic pain ? 5/2- pain usually controlled- takes norco only when really bad per pt.  ?4. Mood: on hight dose zoloft as well as dextro amphetamine ? 5/1- will start Melatonin for sleep 3 mg QHS ?            -antipsychotic agents: none ?5. Neuropsych: This patient is capable of making decisions on her own behalf. ?6. Skin/Wound Care: routine skin care , post op dressing x 7d then may remove and replace with foam dressing  ?7. Fluids/Electrolytes/Nutrition: regular diet monitor I an Os, CMET in am  ? 5/1- will give purewick at night for now and check PVRs q6 hours- x10 episodes, unless retaining. ?5/2- voiding well- no bladder scans seen  ?8.  Hx Scoliosis s/p T2-S1 fusion ?9.  Hx cervical spine fx without SCI s/p C1-2 fusion  ?10.  Hx CAD s/p cardiac stent ASA and Plavix daily  ?11. HTN- ? 5/2- BP elevated this AM- 158/84- will monitor for trend before changing anything.  ?  ? ?I spent a total of 35   minutes on  total care today- >50% coordination of care- due to team conference and d/w pt about lovenox vs ASA ? ? ?LOS: ?2 days ?A FACE TO FACE EVALUATION WAS PERFORMED ? ?Deborah [name] ?12/31/2021, 10:27 AM  ? ? ? ?

## 2022-01-01 ENCOUNTER — Other Ambulatory Visit (HOSPITAL_COMMUNITY): Payer: Medicare Other

## 2022-01-01 DIAGNOSIS — M87051 Idiopathic aseptic necrosis of right femur: Secondary | ICD-10-CM | POA: Diagnosis not present

## 2022-01-01 LAB — AEROBIC/ANAEROBIC CULTURE W GRAM STAIN (SURGICAL/DEEP WOUND)
Culture: NO GROWTH
Culture: NO GROWTH
Culture: NO GROWTH
Gram Stain: NONE SEEN
Gram Stain: NONE SEEN
Gram Stain: NONE SEEN

## 2022-01-01 MED ORDER — SENNA 8.6 MG PO TABS
1.0000 | ORAL_TABLET | Freq: Every day | ORAL | Status: DC
  Administered 2022-01-02 – 2022-01-03 (×2): 8.6 mg via ORAL
  Filled 2022-01-01 (×2): qty 1

## 2022-01-01 NOTE — IPOC Note (Signed)
Overall Plan of Care (IPOC) ?Patient Details ?Name: Deborah [name] ?MRN: [id number] ?DOB: [birth year] ? ?Admitting Diagnosis: Avascular necrosis of bone of right hip ([facility]) ? ?Hospital Problems: Principal Problem: ?  Avascular necrosis of bone of right hip ([facility]) ? ? ? ? Functional Problem List: ?Nursing Bladder, Endurance, Edema, Medication Management, Motor, Pain, Safety, Skin Integrity  ?PT Balance, Behavior, Safety, Edema, Endurance, Motor, Nutrition, Pain, Skin Integrity  ?OT Balance, Edema, Endurance, Motor, Pain, Safety  ?SLP    ?TR    ?    ? Basic ADL?s: ?OT Bathing, Dressing, Toileting  ? ?  Advanced  ADL?s: ?OT Simple Meal Preparation  ?   ?Transfers: ?PT Bed Mobility, Bed to Chair, Car, Furniture  ?OT Toilet, Tub/Shower  ? ?  Locomotion: ?PT Ambulation, Wheelchair Mobility, Stairs  ? ?  Additional Impairments: ?OT None  ?SLP   ?  ?   ?TR    ? ? ?Anticipated Outcomes ?Item Anticipated Outcome  ?Self Feeding Indep  ?Swallowing ?   ?  ?Basic self-care ? Mod I  ?Toileting ? Mod I ?  ?Bathroom Transfers Mod I to supervision  ?Bowel/Bladder ? mod I  ?Transfers ? mod I using LRAD  ?Locomotion ? mod I household distances, supervision community distances >150 ft  ?Communication ?    ?Cognition ?    ?Pain ? < 3  ?Safety/Judgment ? Mod I  ? ?Therapy Plan: ?PT Intensity: Minimum of 1-2 x/day ,45 to 90 minutes ?PT Frequency: 5 out of 7 days ?PT Duration Estimated Length of Stay: 7-10 days ?OT Intensity: Minimum of 1-2 x/day, 45 to 90 minutes ?OT Frequency: 5 out of 7 days ?OT Duration/Estimated Length of Stay: 7-10 days ?   ? ?Due to the current state of emergency, patients may not be receiving their 3-hours of Medicare-mandated therapy. ? ? Team Interventions: ?Nursing Interventions Patient/Family Education, Bladder Management, Disease Management/Prevention, Pain Management, Medication Management, Skin Care/Wound Management, Discharge Planning  ?PT interventions Ambulation/gait training, Cognitive  remediation/compensation, Discharge planning, DME/adaptive equipment instruction, Functional mobility training, Pain management, Psychosocial support, Splinting/orthotics, Therapeutic Activities, UE/LE Strength taining/ROM, Wheelchair propulsion/positioning, UE/LE Coordination activities, Therapeutic Exercise, Stair training, Patient/family education, Skin care/wound management, Neuromuscular re-education, Functional electrical stimulation, Disease management/prevention, Firefighter, Warden/ranger  ?OT Interventions Balance/vestibular training, Discharge planning, Pain management, Self Care/advanced ADL retraining, Therapeutic Activities, UE/LE Coordination activities, Functional mobility training, Patient/family education, Therapeutic Exercise, Community reintegration, DME/adaptive equipment instruction, UE/LE Strength taining/ROM, Wheelchair propulsion/positioning  ?SLP Interventions    ?TR Interventions    ?SW/CM Interventions Discharge Planning, Psychosocial Support, Patient/Family Education  ? ?Barriers to Discharge ?MD  Medical stability, Home enviroment access/loayout, Wound care, Lack of/limited family support, Weight, and Weight bearing restrictions  ?Nursing Decreased caregiver support, Incontinence, Wound Care, Lack of/limited family support, Weight, Weight bearing restrictions ?1 level, level entry. Caregiver can provide min assist at discharge.  ?PT Decreased caregiver support, Home environment access/layout, Lack of/limited family support, Weight bearing restrictions ?   ?OT Weight bearing restrictions, Home environment access/layout ?   ?SLP   ?   ?SW Decreased caregiver support ?   ? ?Team Discharge Planning: ?Destination: PT-Home ,OT- Home , SLP-  ?Projected Follow-up: PT-Outpatient PT, OT-  Home health OT, SLP-  ?Projected Equipment Needs: PT-Rolling walker with 5" wheels, OT- To be determined, Tub/shower bench, SLP-  ?Equipment Details: PT- , OT-  ?Patient/family involved  in discharge planning: PT- Patient,  OT-Patient, SLP-  ? ?MD ELOS: 7-10 days ?Medical Rehab Prognosis:  Good ?Assessment: The patient has  been admitted for CIR therapies with the diagnosis of Hip replacement due to avascular/ osteonecrosis of hip. The team will be addressing functional mobility, strength, stamina, balance, safety, adaptive techniques and equipment, self-care, bowel and bladder mgt, patient and caregiver education, . Goals have been set at mod I. Anticipated discharge destination is home. ? ? ? ?  ? ? ?See Team Conference Notes for weekly updates to the plan of care ? ?

## 2022-01-01 NOTE — Progress Notes (Signed)
?                                                       PROGRESS NOTE ? ? ?Subjective/Complaints: ? ?Pt reports doing well- leaked purewick, so peed bed-  ?Peeing a lot- sleepy around 8pm.  ?Also had 5 BM's yesterday- 2-3 were diarrhea.  ? ? ?ROS: ? ?Pt denies SOB, abd pain, CP, N/V/C/ (+)D, and vision changes ? ? ?Objective: ?  ?No results found. ?No results for input(s): WBC, HGB, HCT, PLT in the last 72 hours. ? ?No results for input(s): NA, K, CL, CO2, GLUCOSE, BUN, CREATININE, CALCIUM in the last 72 hours. ? ? ?Intake/Output Summary (Last 24 hours) at 01/01/2022 1346 ?Last data filed at 01/01/2022 1319 ?Gross per 24 hour  ?Intake 720 ml  ?Output --  ?Net 720 ml  ?  ? ?  ? ?Physical Exam: ?Vital Signs ?Blood pressure (!) 145/77, pulse 93, temperature 97.8 ?F (36.6 ?C), resp. rate 15, height 5\' 3"  (1.6 m), weight 64.3 kg, SpO2 100 %. ? ? ? ?General: awake, alert, appropriate, sitting up in bed; NAD ?HENT: conjugate gaze; oropharynx moist ?CV: regular rate; no JVD ?Pulmonary: CTA B/L; no W/R/R- good air movement ?GI: soft, NT, ND, (+)BS ?Psychiatric: appropriate ?Neurological: Ox3 ?Skin- hip incision C/D/I ? ?Musculoskeletal:     ?   General: No swelling or tenderness.  ?   Comments: THoraco lumbar incision instumentation protruding subcutaneously along the thoracic and lumbar spine  ?Skin: ?   General: Skin is warm and dry.  ?   Comments: Right hip incision with post op dressing   ?Neurological:  ?   General: No focal deficit present.  ?   Mental Status: She is alert and oriented to person, place, and time.  ?   Comments: 5/5 in BUE and LLE ?3- Left knee ext 5/5/ ankle Df/PF, HF too painful  ? ?Assessment/Plan: ?1. Functional deficits which require 3+ hours per day of interdisciplinary therapy in a comprehensive inpatient rehab setting. ?Physiatrist is providing close team supervision and 24 hour management of active medical problems listed below. ?Physiatrist and rehab team continue to assess barriers to  discharge/monitor patient progress toward functional and medical goals ? ?Care Tool: ? ?Bathing ?   ?Body parts bathed by patient: Right arm, Left arm, Chest, Abdomen, Front perineal area, Buttocks, Right upper leg, Left upper leg, Face  ? Body parts bathed by helper: Right lower leg, Left lower leg ?  ?  ?Bathing assist Assist Level: Minimal Assistance - Patient > 75% ?  ?  ?Upper Body Dressing/Undressing ?Upper body dressing   ?What is the patient wearing?: Bra, Pull over shirt ?   ?Upper body assist Assist Level: Independent with assistive device ?   ?Lower Body Dressing/Undressing ?Lower body dressing ? ? ?   ?What is the patient wearing?: Underwear/pull up ? ?  ? ?Lower body assist Assist for lower body dressing: Moderate Assistance - Patient 50 - 74% ?   ? ?Toileting ?Toileting    ?Toileting assist Assist for toileting: Minimal Assistance - Patient > 75% ?  ?  ?Transfers ?Chair/bed transfer ? ?Transfers assist ?   ? ?Chair/bed transfer assist level: Contact Guard/Touching assist ?Chair/bed transfer assistive device: Armrests ?  ?Locomotion ?Ambulation ? ? ?Ambulation assist ? ?   ? ?Assist level: Contact  Guard/Touching assist ?Assistive device: Walker-rolling ?Max distance: 37'  ? ?Walk 10 feet activity ? ? ?Assist ?   ? ?Assist level: Contact Guard/Touching assist ?Assistive device: Walker-rolling  ? ?Walk 50 feet activity ? ? ?Assist   ? ?Assist level: Contact Guard/Touching assist ?Assistive device: Walker-rolling  ? ? ?Walk 150 feet activity ? ? ?Assist Walk 150 feet activity did not occur: Safety/medical concerns ? ?  ?  ?  ? ?Walk 10 feet on uneven surface  ?activity ? ? ?Assist Walk 10 feet on uneven surfaces activity did not occur: Safety/medical concerns ? ? ?  ?   ? ?Wheelchair ? ? ? ? ?Assist Is the patient using a wheelchair?: Yes ?Type of Wheelchair: Manual ?  ? ?Wheelchair assist level: Supervision/Verbal cueing ?Max wheelchair distance: >100 ft  ? ? ?Wheelchair 50 feet with 2 turns  activity ? ? ? ?Assist ? ?  ?  ? ? ?Assist Level: Supervision/Verbal cueing  ? ?Wheelchair 150 feet activity  ? ? ? ?Assist ?   ? ? ?Assist Level: Moderate Assistance - Patient 50 - 74%  ? ?Blood pressure (!) 145/77, pulse 93, temperature 97.8 ?F (36.6 ?C), resp. rate 15, height 5\' 3"  (1.6 m), weight 64.3 kg, SpO2 100 %. ? ?Medical Problem List and Plan: ?1. Functional deficits secondary to Right hip avascular necrosis , s/p THR, post approach ?            -patient may  shower 4d post op with wound covered ?            -ELOS/Goals: 10-12d Mod I goals ? D/c 01/04/22 ? Con't CIR- PT and OT- posterior hip precautions ?2.  Antithrombotics: ?-DVT/anticoagulation:  Pharmaceutical: Lovenox ?5/2- Ortho Surgery stopped Lovenox- and put on ASA daily and Plavix-  ?            -antiplatelet therapy: ASA 81mg  /day  ?3. Pain Management: Hydrocodone mod/sev pain, tylenol for mild, on Gabapentin chronic neuropathic pain ? 5/2- pain usually controlled- takes norco only when really bad per pt.  ?4. Mood: on hight dose zoloft as well as dextro amphetamine ? 5/1- will start Melatonin for sleep 3 mg QHS ?            -antipsychotic agents: none ?5. Neuropsych: This patient is capable of making decisions on her own behalf. ?6. Skin/Wound Care: routine skin care , post op dressing x 7d then may remove and replace with foam dressing  ?7. Fluids/Electrolytes/Nutrition: regular diet monitor I an Os, CMET in am  ? 5/1- will give purewick at night for now and check PVRs q6 hours- x10 episodes, unless retaining. ?5/2- voiding well- no bladder scans seen  ? 5/3- still voiding a lot overnight- con't purewick as required ?8.  Hx Scoliosis s/p T2-S1 fusion ?9.  Hx cervical spine fx without SCI s/p C1-2 fusion  ?10.  Hx CAD s/p cardiac stent ASA and Plavix daily  ?11. HTN- ? 5/2- BP elevated this AM- 158/84- will monitor for trend before changing anything. ?5/3- BP 140s/80s- if stays up, will start something ?12. Hip surgery- was on Cephalosporin PO-  will sotp since Cx came back (-)  ?  ? ?I spent a total of 36    minutes on total care today- >50% coordination of care- due to d/w pharmacy about PO ABX- and IPOC.  ? ? ?LOS: ?3 days ?A FACE TO FACE EVALUATION WAS PERFORMED ? ?Canton Yearby ?01/01/2022, 1:46 PM  ? ? ? ?

## 2022-01-01 NOTE — Progress Notes (Signed)
Physical Therapy Session Note ? ?Patient Details  ?Name: Deborah [name] ?MRN: [id number] ?Date of Birth: [birth year] ? ?Today's Date: 01/01/2022 ?PT Individual Time: 5035-4656 ?PT Individual Time Calculation (min): 75 min  ? ?Short Term Goals: ?Week 1:  PT Short Term Goal 1 (Week 1): STG=LTG due to ELOS ? ?Skilled Therapeutic Interventions/Progress Updates:  ?  Pt seated in w/c on arrival and agreeable to therapy. Pt reports 6/10 pain, premedicated. Rest and positioning provided as needed. Pt reports pain decreased to 3/10 with activity. Pt reports having diarrhea this AM and requests to go to the bathroom. Supervision for ambulatory transfer with RW and for 3/3 toileting tasks. Hand hygiene in standing with no LOB noted. Oral hygiene in sitting. Pt reports feeling light headed with mobility. Seated BP=124/73. Standing BP=119/78. Pt noted improvement in symptoms later in session when reminded to breathe.  ? ?Pt required mod redirection d/t attention and hyperverbal tendencies.  ? ?Pt ambulated x 150 ft and x 75 ft with supervision and RW. Pt then performed Side stepping with RW and supervision, cueing for precautions to prevent internal rotation and for increased lateral hip activation. Transitioned to side stepping with hall rail 2 x 12 ft with supervision, demoing increased step width without barrier of RW.  ? ?Pt returned to room and remained in w/c, was left with all needs in reach and alarm active.  ? ?Therapy Documentation ?Precautions:  ?Precautions ?Precautions: Posterior Hip, Fall ?Precaution Comments: patient able to teach back 3/3 posterior hip precautions ?Restrictions ?Weight Bearing Restrictions: Yes ?RLE Weight Bearing: Weight bearing as tolerated ?General: ?  ? ? ?Therapy/Group: Individual Therapy ? ?Marylene Land Akito Boomhower ?01/01/2022, 9:40 AM  ?

## 2022-01-01 NOTE — Progress Notes (Signed)
Occupational Therapy Session Note ? ?Patient Details  ?Name: Deborah [name] ?MRN: [id number] ?Date of Birth: May 16, 1951 ? ?Today's Date: 01/01/2022 ?OT Individual Time: 1400-1530 ?OT Individual Time Calculation (min): 90 min  ? ? ?Short Term Goals: ?Week 1:  OT Short Term Goal 1 (Week 1): STG = LTG 2/2 ELOS ? ?Skilled Therapeutic Interventions/Progress Updates:  ?   ? ?Pt seen for extended comprehensive skilled OT session this day and agreeable and motivated toward all presented activity. Pt reported having 5 BM's due to constipation but feeling much better this afternoon. No pain reported this session. Pt was in bed at start of session and OT encouraged shower training and pt eager to wash her hair as well. OT assisted to cover R LE hip incision with waterproof cover over dressing as per MD orders for OK to shower with covered dressing post op day 4. Pt was able to doff all clothing with SBA EOB except R LE pants and socks with use of reacher with min A due to novel device and new posterior THA precautions. Required set up for retrieving all bathing and dressing items. Pt required contact guard to ambulate to and from toilet and shower seat in bathroom with RW and close s for transfer to shower bench. Used 3 in 1 commode over toilet for urination with contact guard A.  Standing balance for all clothing management and peri area bathing Fair + overall.  Shower level bathing with SBA for UE bathing and min A for LE bathing including standing at grab bar for peri hygiene except for R LE due to THA and no sponge thus total assist for bathing R LE. Required set up and SBA for UE dressing and min A with use of reacher for panties and pants with R THA prec recall.  Total A for non-skid slipper socks. Grooming SBA level and hair drying with support. Pt requested to remain OOB in w/c with items and call bell, chair alarm and needs in place. Discussed therapy plan for tomorrow to train in use of sock aide and [facility] for socks  and shoe management progression.  ? ? ?Therapy/Group: Individual Therapy ? ?Vicenta Dunning ?01/01/2022, 4:49 PM ?

## 2022-01-01 NOTE — Progress Notes (Signed)
Physical Therapy Session Note ? ?Patient Details  ?Name: Deborah [name] ?MRN: [id number] ?Date of Birth: Jan 08, 1951 ? ?Today's Date: 01/01/2022 ?PT Individual Time: 8250-5397 and 1100-1205 ?PT Individual Time Calculation (min): 35 min and 65 min  ? ?Short Term Goals: ?Week 1:  PT Short Term Goal 1 (Week 1): STG=LTG due to ELOS ? ?Skilled Therapeutic Interventions/Progress Updates:  ?   ?Session 1: ?Patient in bed upon PT arrival. Patient alert and agreeable to PT session. Patient reported 6/10 R hip and leg pain during session, RN made aware and provided hydrocodone for pain during session. PT provided repositioning, rest breaks, and distraction as pain interventions throughout session.  ? ?Patient concerned about continued diarrhea this morning, RN made aware when provided morning meds during session. Patient reported increased fatigue due to diarrhea, educated on increased fluid intake due to risk of dehydration, patient agreeable.  ? ?Patient requested to perform toileting due to bowl urgency. ? ?Therapeutic Activity: ?Bed Mobility: Patient performed supine to sit independently with HOB slightly elevated.  ?Transfers: Patient performed sit to/from stand from the bed and [facility] over the toilet with supervision, demonstrated good adherence to precautions without cues. Provided supervision for safety due to continued balance deficits. Patient was continent of bladder and bowl during toileting, provided cues for precautions during peri-care.  ? ?Patient in w/c in the room at end of session with breaks locked, seat belt alarm set, and all needs within reach.  ? ?Session 2: ?Patient in w/c in the room upon PT arrival. Patient alert and agreeable to PT session. Patient reported 4/10 R hip pain during session, RN made aware. PT provided repositioning, rest breaks, and distraction as pain interventions throughout session.  ? ?Focused session on community integration and outdoor ambulation per patient request.  ? ?Therapeutic  Activity: ?Transfers: Patient performed sit to/from stand x4 with supervision using RW, as above ? ?Gait Training:  ?Patient ambulated >300 feet using RW with CGA over unlevel side-walk. Ambulated with mild antalgic gait on R with step-through gait pattern, demonstrated good safety awareness of environment and navigation on unlevel terrain. Provided verbal cues for safe RW use and proximity x2. ? ?Community integration: ?Patient ambulated into the gift shop, browsed items and discussed strategies for energy conservation, asking for assistance, and management of items via walker bag or cart. Patient retrieved a [employer]eps[employer] from the store and stood in line >2 min with RW and paid for the drink. Walked out of the store, appropriately handing the drink to PT for safety without a walker bag at this time. ? ?Patient in w/c in the room at end of session with breaks locked, seat belt alarm set, and all needs within reach. Patient requested to end session 10 min early due to increased fatigue from multiple therapy sessions and recent bouts of diarrhea this morning.  ? ?Therapy Documentation ?Precautions:  ?Precautions ?Precautions: Posterior Hip, Fall ?Precaution Comments: patient able to teach back 3/3 posterior hip precautions ?Restrictions ?Weight Bearing Restrictions: Yes ?RLE Weight Bearing: Weight bearing as tolerated ?General: ?PT Amount of Missed Time (min): 10 Minutes ?PT Missed Treatment Reason: Patient fatigue ? ? ? ?Therapy/Group: Individual Therapy ? ?Helayne Seminole PT, DPT ? ?01/01/2022, 4:31 PM  ?

## 2022-01-02 DIAGNOSIS — I10 Essential (primary) hypertension: Secondary | ICD-10-CM

## 2022-01-02 DIAGNOSIS — F319 Bipolar disorder, unspecified: Secondary | ICD-10-CM

## 2022-01-02 DIAGNOSIS — I4891 Unspecified atrial fibrillation: Secondary | ICD-10-CM

## 2022-01-02 NOTE — Progress Notes (Signed)
Physical Therapy Session Note ? ?Patient Details  ?Name: Deborah [name] ?MRN: [id number] ?Date of Birth: [birth year] ? ?Today's Date: 01/02/2022 ?PT Individual Time: 9629-5284 ?PT Individual Time Calculation (min): 42 min  ? ?Short Term Goals: ?Week 1:  PT Short Term Goal 1 (Week 1): STG=LTG due to ELOS ? ?Skilled Therapeutic Interventions/Progress Updates:  ? Received pt sitting on commode stating "you're going to help me get dressed this morning" - pt insistent on focusing on dressing this morning. Pt agreeable to PT treatment and reported pain 4/10 in RLE (premedicated). Session with emphasis on toileting, dressing, functional mobility/transfers, and generalized strengthening and endurance. Pt performed peri-care seated mod I. Doffed gown and donned bra and pull over shirt with set up assist. Pt performed all transfers with RW and supervision throughout session. Donned ted hose with max A and pants and shoes with mod A to maintain posterior hip precautions. Pt stood and pulled pants over hips with supervision. Pt requested to work on HEP exercises and performed the following exercises with supervision and verbal cues for technique with emphasis on strength/ROM - of note, pt very particular with breathing strategies ?-seated LAQ 2x10 bilaterally  ?-standing alternating marches 2x5 (small range on R with caution for posterior hip precautions) ?-heel raises 2x10 ?Concluded session with pt sitting in WC, needs within reach, and seatbelt alarm on.  ? ?Therapy Documentation ?Precautions:  ?Precautions ?Precautions: Posterior Hip, Fall ?Precaution Comments: patient able to teach back 3/3 posterior hip precautions ?Restrictions ?Weight Bearing Restrictions: Yes ?RLE Weight Bearing: Weight bearing as tolerated ? ?Therapy/Group: Individual Therapy ?Alfonso Patten ?Raechel Chute PT, DPT  ?01/02/2022, 7:23 AM  ?

## 2022-01-02 NOTE — Progress Notes (Signed)
Patient ID: Deborah [name], female   DOB: 10-Jul-1951, 71 y.o.   MRN: [id number] Spoke with her daughter who is here to visit and talked about home health preference both do not have one. Do request a female since pt will be alone at home. Have reached out to a few home health agencies to see who can begin on Monday and request a female. Pt plans on getting rollator on her own but would like a prescription for this. Will follow until discharge Sat. ?

## 2022-01-02 NOTE — Progress Notes (Signed)
Occupational Therapy Session Note ? ?Patient Details  ?Name: Deborah [name] ?MRN: [id number] ?Date of Birth: Feb 08, 1951 ? ?Today's Date: 01/02/2022 ?OT Individual Time: [id number] ?OT Individual Time Calculation (min): 60 min  ? ? ?Short Term Goals: ?Week 1:  OT Short Term Goal 1 (Week 1): STG = LTG 2/2 ELOS ? ?Skilled Therapeutic Interventions/Progress Updates:  ?   ?OT session focused on AE training for ADL's and simple mobility in bedroom and kitchen area. Pt up in w/c at start of session with no reports of pain at rest nor with light activity just mild fatigue overall from earlier PT session. OT training this session emphasizing R THA posterior precautions and integration of new AE including sock aide, reacher and [facility] for lower body garment management and light item retrieval in practice kitchen. Pt with TED hose in place thus used slipper socks to train in use of sock aide with min A overall. Reacher for doffing socks with SBA. [place] for donning tennis shoes with min A. Pt self propelled manual w/c 100 ft in hallway then pushed for time management to access training in demo apartment in kitchen area. OT educated pt on safe use of RW to amb along counters and perdorm simple dish reach retrieval and counter use for bridging transport. Cues for maintaining no twisting and reacher integration. OT assisted pt back to room and handed off to [place] MD who arrived for am consult. Chair alarm and call bell with all needs place within reach.  ? ?Therapy/Group: Individual Therapy ? ?Barnabas Lister ?01/02/2022, 4:38 PM ?

## 2022-01-02 NOTE — Progress Notes (Signed)
?                                                       PROGRESS NOTE ? ? ?Subjective/Complaints: ? ?Pt with no acute events overnight. No new complaints or concerns this AM. Reports she is looking forward to going home soon. ? ? ?ROS: ? ?Pt denies SOB, abd pain, CP, N/V/C/ (+)D, and vision changes, no HA. ? ? ?Objective: ?  ?No results found. ?No results for input(s): WBC, HGB, HCT, PLT in the last 72 hours. ? ?No results for input(s): NA, K, CL, CO2, GLUCOSE, BUN, CREATININE, CALCIUM in the last 72 hours. ? ? ?Intake/Output Summary (Last 24 hours) at 01/02/2022 0754 ?Last data filed at 01/02/2022 0447 ?Gross per 24 hour  ?Intake 720 ml  ?Output 1000 ml  ?Net -280 ml  ? ?  ? ?  ? ?Physical Exam: ?Vital Signs ?Blood pressure (!) 169/97, pulse 90, temperature 97.6 ?F (36.4 ?C), resp. rate 17, height 5\' 3"  (1.6 m), weight 64.3 kg, SpO2 98 %. ? ? ? ?General: awake, alert, appropriate, sitting up in bed; NAD ?HENT: conjugate gaze; oropharynx moist ?CV: regular rate; no JVD ?Pulmonary: CTA B/L; no W/R/R- good air movement, non-labored breathing ?GI: soft, NT, ND, (+)BS ?Psychiatric: appropriate, normal effect ?Neurological: Ox3 ?Skin- hip incision C/D/I ? ?Musculoskeletal:     ?   General: No swelling or tenderness.  ?   Comments: THoraco lumbar incision instumentation protruding subcutaneously along the thoracic and lumbar spine  ?Skin: ?   General: Skin is warm and dry.  ?   Comments: Right hip incision with post op dressing   ?Neurological:  ?   General: No focal deficit present.  ?   Mental Status: She is alert and oriented to person, place, and time.  ?   Comments: 5/5 in BUE and LLE ?3- Left knee ext 5/5/ ankle Df/PF, HF too painful  ? ?Assessment/Plan: ?1. Functional deficits which require 3+ hours per day of interdisciplinary therapy in a comprehensive inpatient rehab setting. ?Physiatrist is providing close team supervision and 24 hour management of active medical problems listed below. ?Physiatrist and rehab team  continue to assess barriers to discharge/monitor patient progress toward functional and medical goals ? ?Care Tool: ? ?Bathing ?   ?Body parts bathed by patient: Right arm, Left arm, Chest, Abdomen, Front perineal area, Buttocks, Right upper leg, Left upper leg, Face, Left lower leg  ? Body parts bathed by helper: Right lower leg ?  ?  ?Bathing assist Assist Level: Minimal Assistance - Patient > 75% ?  ?  ?Upper Body Dressing/Undressing ?Upper body dressing   ?What is the patient wearing?: Bra, Pull over shirt ?   ?Upper body assist Assist Level: Independent with assistive device ?   ?Lower Body Dressing/Undressing ?Lower body dressing ? ? ?   ?What is the patient wearing?: Underwear/pull up, Pants ? ?  ? ?Lower body assist Assist for lower body dressing: Minimal Assistance - Patient > 75% ?   ? ?Toileting ?Toileting    ?Toileting assist Assist for toileting: Contact Guard/Touching assist ?  ?  ?Transfers ?Chair/bed transfer ? ?Transfers assist ?   ? ?Chair/bed transfer assist level: Contact Guard/Touching assist ?Chair/bed transfer assistive device: Armrests ?  ?Locomotion ?Ambulation ? ? ?Ambulation assist ? ?   ? ?Assist level:  Contact Guard/Touching assist ?Assistive device: Walker-rolling ?Max distance: 45'  ? ?Walk 10 feet activity ? ? ?Assist ?   ? ?Assist level: Contact Guard/Touching assist ?Assistive device: Walker-rolling  ? ?Walk 50 feet activity ? ? ?Assist   ? ?Assist level: Contact Guard/Touching assist ?Assistive device: Walker-rolling  ? ? ?Walk 150 feet activity ? ? ?Assist Walk 150 feet activity did not occur: Safety/medical concerns ? ?  ?  ?  ? ?Walk 10 feet on uneven surface  ?activity ? ? ?Assist Walk 10 feet on uneven surfaces activity did not occur: Safety/medical concerns ? ? ?  ?   ? ?Wheelchair ? ? ? ? ?Assist Is the patient using a wheelchair?: Yes ?Type of Wheelchair: Manual ?  ? ?Wheelchair assist level: Supervision/Verbal cueing ?Max wheelchair distance: >100 ft  ? ? ?Wheelchair 50  feet with 2 turns activity ? ? ? ?Assist ? ?  ?  ? ? ?Assist Level: Supervision/Verbal cueing  ? ?Wheelchair 150 feet activity  ? ? ? ?Assist ?   ? ? ?Assist Level: Moderate Assistance - Patient 50 - 74%  ? ?Blood pressure (!) 169/97, pulse 90, temperature 97.6 ?F (36.4 ?C), resp. rate 17, height 5\' 3"  (1.6 m), weight 64.3 kg, SpO2 98 %. ? ?Medical Problem List and Plan: ?1. Functional deficits secondary to Right hip avascular necrosis , s/p THR, post approach ?            -patient may  shower 4d post op with wound covered ?            -ELOS/Goals: 10-12d Mod I goals ? D/c 01/04/22 ? Con't CIR- PT and OT- posterior hip precautions ?2.  Antithrombotics: ?-DVT/anticoagulation:  Pharmaceutical: Lovenox ?5/2- Ortho Surgery stopped Lovenox- and put on ASA daily and Plavix-  ?            -antiplatelet therapy: ASA 81mg  /day  ?3. Pain Management: Hydrocodone mod/sev pain, tylenol for mild, on Gabapentin chronic neuropathic pain ? 5/2- pain usually controlled- takes norco only when really bad per pt.  ? 5/4 pain reported to be controlled with current meds, continue current ?4. Mood: on hight dose zoloft as well as dextro amphetamine ? 5/1- will start Melatonin for sleep 3 mg QHS ?            -antipsychotic agents: none ? 5/4 Mood is overall good today, continue to follow ?5. Neuropsych: This patient is capable of making decisions on her own behalf. ?6. Skin/Wound Care: routine skin care , post op dressing x 7d then may remove and replace with foam dressing  ?7. Fluids/Electrolytes/Nutrition: regular diet monitor I an Os, CMET in am  ? 5/1- will give purewick at night for now and check PVRs q6 hours- x10 episodes, unless retaining. ?5/2- voiding well- no bladder scans seen  ? 5/3- still voiding a lot overnight- con't purewick as required ?8.  Hx Scoliosis s/p T2-S1 fusion ?9.  Hx cervical spine fx without SCI s/p C1-2 fusion  ?10.  Hx CAD s/p cardiac stent ASA and Plavix daily  ?11. HTN- ? 5/2- BP elevated this AM- 158/84-  will monitor for trend before changing anything. ?5/3- BP 140s/80s- if stays up, will start something ?5/4 - BP elevated in AM but was in better range yesterday, continue to monitor ?12. Hip surgery- was on Cephalosporin PO- will sotp since Cx came back (-)  ?  ? ?LOS: ?4 days ?A FACE TO FACE EVALUATION WAS PERFORMED ? ?Jennye Boroughs ?01/02/2022, 7:54 AM  ? ? ? ?
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
## 2022-01-02 NOTE — Progress Notes (Signed)
Physical Therapy Session Note ? ?Patient Details  ?Name: Deborah [name] ?MRN: [id number] ?Date of Birth: August 24, 1951 ? ?Today's Date: 01/02/2022 ?PT Individual Time: 1120-1200 and 1417-1530 ?PT Individual Time Calculation (min): 40 min and 17min ? ?Short Term Goals: ?Week 1:  PT Short Term Goal 1 (Week 1): STG=LTG due to ELOS ?Week 2:    ?Week 3:    ? ?Skilled Therapeutic Interventions/Progress Updates:  ?  AM SESSION ?Pain- pt denies pain at rest, 3/10 w/activity, treatment to tolerance.  ?Pt oob in wc, agreeable to session, makes multiple c/os and requirests prior to leaving room for items, assist arranging items in room, c/o regarding engineering working in room, etc., redirected mult times then transported to ortho gym for session w/focus on car transfer training.   ? ?Pt instructed w/ambulatory transfer w/RW, reviewed hip precautions and how to avoid violating precautions during transition.  Pt then performed car transfer w/cga and light verbal cueing, additional time for management of RLE. ? ?Pt repeated car transfer x 2, second effort w/close supervision and additional time.   ? ?At end of session, pt transportd back to room.  Pt left oob in wc w/alarm belt set and needs in reach ? ? ?PM SESSION ?Pain - 3/10, treatment to tolerance ?Session focusing on gait training and ascending/descending single step. ?Wc propulsion x 120ft mod I ? ?Reviewed technique/safety/sequencing then pt performed x 2 w/cues w/RW. ? ?Pt practiced x 2 rounds, last pass performed w/cga only, no cues required.   ? ?Reviewed hip precautions and discussed need to avoid extremes of movement/not to avoid safe movement, discussed w/functional examples, discussed positioning in bed using pillows in sidelying. ?Gait x 148ft w/RW w/close supervision. ?Wc propulsion x 3ft mod I. ?From wc pt brushed teeth w/set up only. ?Ambulatory transfer to edge of bed w/RW and supervision. ?Sit to supine w/min assist to manage RLE ? ?Pt requested to review  supine HEP exercises.  Using HEP provided, reviewed instructions then pt performed: ?AAROM hip abd/add x 10 ?AROM to AAROM R hip and knee flexion. ? ?Pt requires multiple cues to attend to tasks of session due to multitude of inner distractions throughout session.   ? ?Therapy Documentation ?Precautions:  ?Precautions ?Precautions: Posterior Hip, Fall ?Precaution Comments: patient able to teach back 3/3 posterior hip precautions ?Restrictions ?Weight Bearing Restrictions: Yes ?RLE Weight Bearing: Weight bearing as tolerated ? ?Therapy/Group: Individual Therapy ?Callie Fielding, PT ? ? ?Deborah [name] ?01/02/2022, 12:04 PM  ?

## 2022-01-03 DIAGNOSIS — I2510 Atherosclerotic heart disease of native coronary artery without angina pectoris: Secondary | ICD-10-CM

## 2022-01-03 DIAGNOSIS — I10 Essential (primary) hypertension: Secondary | ICD-10-CM

## 2022-01-03 MED ORDER — ACETAMINOPHEN 325 MG PO TABS: 325.0000 mg | ORAL_TABLET | ORAL | Status: AC | PRN

## 2022-01-03 MED ORDER — ASPIRIN 81 MG PO TBEC: 81.0000 mg | DELAYED_RELEASE_TABLET | Freq: Every day | ORAL | 12 refills | Status: DC

## 2022-01-03 MED ORDER — MELATONIN 3 MG PO TABS: 3.0000 mg | ORAL_TABLET | Freq: Every day | ORAL | 0 refills | Status: DC

## 2022-01-03 MED ORDER — METHOCARBAMOL 500 MG PO TABS
500.0000 mg | ORAL_TABLET | Freq: Four times a day (QID) | ORAL | 0 refills | Status: DC | PRN
Start: 2022-01-03 — End: 2023-11-25

## 2022-01-03 MED ORDER — SENNA 8.6 MG PO TABS: 1.0000 | ORAL_TABLET | Freq: Every day | ORAL | 0 refills | Status: AC

## 2022-01-03 NOTE — Plan of Care (Signed)
?  Problem: Sit to Stand ?Goal: LTG:  Patient will perform sit to stand in prep for activites of daily living with assistance level (OT) ?Description: LTG:  Patient will perform sit to stand in prep for activites of daily living with assistance level (OT) ?Outcome: Completed/Met ?  ?Problem: RH Bathing ?Goal: LTG Patient will bathe all body parts with assist levels (OT) ?Description: LTG: Patient will bathe all body parts with assist levels (OT) ?Outcome: Completed/Met ?  ?Problem: RH Dressing ?Goal: LTG Patient will perform upper body dressing (OT) ?Description: LTG Patient will perform upper body dressing with assist, with/without cues (OT). ?Outcome: Completed/Met ?Goal: LTG Patient will perform lower body dressing w/assist (OT) ?Description: LTG: Patient will perform lower body dressing with assist, with/without cues in positioning using equipment (OT) ?Outcome: Completed/Met ?  ?Problem: RH Toileting ?Goal: LTG Patient will perform toileting task (3/3 steps) with assistance level (OT) ?Description: LTG: Patient will perform toileting task (3/3 steps) with assistance level (OT)  ?Outcome: Completed/Met ?  ?Problem: RH Simple Meal Prep ?Goal: LTG Patient will perform simple meal prep w/assist (OT) ?Description: LTG: Patient will perform simple meal prep with assistance, with/without cues (OT). ?Outcome: Completed/Met ?  ?Problem: RH Toilet Transfers ?Goal: LTG Patient will perform toilet transfers w/assist (OT) ?Description: LTG: Patient will perform toilet transfers with assist, with/without cues using equipment (OT) ?Outcome: Completed/Met ?  ?Problem: RH Tub/Shower Transfers ?Goal: LTG Patient will perform tub/shower transfers w/assist (OT) ?Description: LTG: Patient will perform tub/shower transfers with assist, with/without cues using equipment (OT) ?Outcome: Completed/Met ?  ?

## 2022-01-03 NOTE — Progress Notes (Signed)
?                                                       PROGRESS NOTE ? ? ?Subjective/Complaints: ? ?Pt reports no complaints today. She is a little anxious about going home.  ? ?ROS: ? ?Pt denies SOB, abd pain, CP, N/V/C/ (+)D, and vision changes. No fever or chills. ? ? ?Objective: ?  ?No results found. ?No results for input(s): WBC, HGB, HCT, PLT in the last 72 hours. ? ?No results for input(s): NA, K, CL, CO2, GLUCOSE, BUN, CREATININE, CALCIUM in the last 72 hours. ? ? ?Intake/Output Summary (Last 24 hours) at 01/03/2022 0753 ?Last data filed at 01/02/2022 1900 ?Gross per 24 hour  ?Intake 480 ml  ?Output --  ?Net 480 ml  ? ?  ? ?  ? ?Physical Exam: ?Vital Signs ?Blood pressure (!) 166/83, pulse 83, temperature 97.6 ?F (36.4 ?C), resp. rate 18, height 5\' 3"  (1.6 m), weight 64.3 kg, SpO2 99 %. ? ? ? ?General: awake, alert, appropriate, sitting up in bed; NAD ?HENT: conjugate gaze; oropharynx moist ?CV: regular rate; no JVD ?Pulmonary: CTA B/L; no W/R/R- good air movement, non-labored breathing ?GI: soft, NT, ND, (+)BS ?Psychiatric: appropriate, normal effect, very pleasant ?Neurological: Ox3 ?Skin- hip incision C/D/I ?Moving all 4 extrem ? ?Musculoskeletal:     ?   General: No swelling or tenderness.  ?   Comments: THoraco lumbar incision instumentation protruding subcutaneously along the thoracic and lumbar spine  ?Skin: ?   General: Skin is warm and dry.  ?   Comments: Right hip incision with aquacell dressing ?Neurological:  ?   General: No focal deficit present.  ?   Mental Status: She is alert and oriented to person, place, and time.  ?   Comments: 5/5 in BUE and LLE ?3- Left knee ext 5/5/ ankle Df/PF, HF too painful  ? ?Assessment/Plan: ?1. Functional deficits which require 3+ hours per day of interdisciplinary therapy in a comprehensive inpatient rehab setting. ?Physiatrist is providing close team supervision and 24 hour management of active medical problems listed below. ?Physiatrist and rehab team continue  to assess barriers to discharge/monitor patient progress toward functional and medical goals ? ?Care Tool: ? ?Bathing ?   ?Body parts bathed by patient: Right arm, Left arm, Chest, Abdomen, Front perineal area, Buttocks, Right upper leg, Left upper leg, Face, Left lower leg  ? Body parts bathed by helper: Right lower leg ?  ?  ?Bathing assist Assist Level: Minimal Assistance - Patient > 75% ?  ?  ?Upper Body Dressing/Undressing ?Upper body dressing   ?What is the patient wearing?: Bra, Pull over shirt ?   ?Upper body assist Assist Level: Independent with assistive device ?   ?Lower Body Dressing/Undressing ?Lower body dressing ? ? ?   ?What is the patient wearing?: Underwear/pull up, Pants ? ?  ? ?Lower body assist Assist for lower body dressing: Minimal Assistance - Patient > 75% ?   ? ?Toileting ?Toileting    ?Toileting assist Assist for toileting: Contact Guard/Touching assist ?  ?  ?Transfers ?Chair/bed transfer ? ?Transfers assist ?   ? ?Chair/bed transfer assist level: Supervision/Verbal cueing ?Chair/bed transfer assistive device: Walker ?  ?Locomotion ?Ambulation ? ? ?Ambulation assist ? ?   ? ?Assist level: Contact Guard/Touching assist ?Assistive device: Walker-rolling ?  Max distance: 67'  ? ?Walk 10 feet activity ? ? ?Assist ?   ? ?Assist level: Contact Guard/Touching assist ?Assistive device: Walker-rolling  ? ?Walk 50 feet activity ? ? ?Assist   ? ?Assist level: Contact Guard/Touching assist ?Assistive device: Walker-rolling  ? ? ?Walk 150 feet activity ? ? ?Assist Walk 150 feet activity did not occur: Safety/medical concerns ? ?  ?  ?  ? ?Walk 10 feet on uneven surface  ?activity ? ? ?Assist Walk 10 feet on uneven surfaces activity did not occur: Safety/medical concerns ? ? ?  ?   ? ?Wheelchair ? ? ? ? ?Assist Is the patient using a wheelchair?: Yes ?Type of Wheelchair: Manual ?  ? ?Wheelchair assist level: Supervision/Verbal cueing ?Max wheelchair distance: >100 ft  ? ? ?Wheelchair 50 feet with 2  turns activity ? ? ? ?Assist ? ?  ?  ? ? ?Assist Level: Supervision/Verbal cueing  ? ?Wheelchair 150 feet activity  ? ? ? ?Assist ?   ? ? ?Assist Level: Moderate Assistance - Patient 50 - 74%  ? ?Blood pressure (!) 166/83, pulse 83, temperature 97.6 ?F (36.4 ?C), resp. rate 18, height 5\' 3"  (1.6 m), weight 64.3 kg, SpO2 99 %. ? ?Medical Problem List and Plan: ?1. Functional deficits secondary to Right hip avascular necrosis , s/p THR, post approach ?            -patient may  shower 4d post op with wound covered ?            -ELOS/Goals: 10-12d Mod I goals ? D/c 01/04/22 ? Con't CIR- PT and OT- posterior hip precautions ? -Ortho f/u with Dr Zachery Dakins 2 weeks after surgery ?2.  Antithrombotics: ?-DVT/anticoagulation:  Pharmaceutical: Lovenox ?5/2- Ortho Surgery stopped Lovenox- and put on ASA daily and Plavix-  ?            -antiplatelet therapy: ASA 81mg  /day  ?3. Pain Management: Hydrocodone mod/sev pain, tylenol for mild, on Gabapentin chronic neuropathic pain ? 5/2- pain usually controlled- takes norco only when really bad per pt.  ? 5/4 pain reported to be controlled with current meds, continue current ?4. Mood: on hight dose zoloft as well as dextro amphetamine ? 5/1- will start Melatonin for sleep 3 mg QHS ?            -antipsychotic agents: none ? 5/4 Mood is overall good today, continue to follow ? -5/5 Mood good today ?5. Neuropsych: This patient is capable of making decisions on her own behalf. ?6. Skin/Wound Care: routine skin care , post op dressing x 7d then may remove and replace with foam dressing  ?7. Fluids/Electrolytes/Nutrition: regular diet monitor I an Os, CMET in am  ? 5/1- will give purewick at night for now and check PVRs q6 hours- x10 episodes, unless retaining. ?5/2- voiding well- no bladder scans seen  ? 5/3- still voiding a lot overnight- con't purewick as required ?8.  Hx Scoliosis s/p T2-S1 fusion ?9.  Hx cervical spine fx without SCI s/p C1-2 fusion  ?10.  Hx CAD s/p cardiac stent ASA  and Plavix daily  ? -Denies chest pain, continue to monitor ?11. HTN- ? 5/2- BP elevated this AM- 158/84- will monitor for trend before changing anything. ?5/3- BP 140s/80s- if stays up, will start something ?5/5 - BP well controlled this AM, continue to monitor ?12. Hip surgery- was on Cephalosporin PO- will sotp since Cx came back (-)  ?  ?  ? ?LOS: ?5 days ?  A FACE TO FACE EVALUATION WAS PERFORMED ? ?Deborah [name] ?01/03/2022, 7:53 AM  ? ? ? ?

## 2022-01-03 NOTE — Progress Notes (Signed)
Inpatient Rehabilitation Care Coordinator ?Discharge Note DC SAT 5/6 ? ?Patient Details  ?Name: Deborah [name] ?MRN: [id number] ?Date of Birth: September 10, 1950 ? ? ?Discharge location: HOME WITH INTERMITTENT ASSIST FROM [facility], NEIGHBOR AND [facility] MEMBERS ? ?Length of Stay: 6 DAYS ? ?Discharge activity level: MOD/I LEVEL ? ?Home/community participation: ACTIVE ? ?Patient response QQ:IWLNLG Literacy - How often do you need to have someone help you when you read instructions, pamphlets, or other written material from your doctor or pharmacy?: Rarely ? ?Patient response XQ:JJHERD Isolation - How often do you feel lonely or isolated from those around you?: Never ? ?Services provided included: MD, RD, PT, OT, RN, CM, Pharmacy, SW ? ?Financial Services:  ?Field seismologist Utilized: Medicare ?  ? ?Choices offered to/list presented to: PT AND DAUGHTER ? ?Follow-up services arranged:  ?Home Health, Patient/Family has no preference for HH/DME agencies ?Home Health Agency: [facility]- PT & OT  ? HAS ALL DME PLANS TO GET A NEW ROLLATOR -WANTS TO PICK IT ON HER OWN AND PRESCRIPTION GIVEN TO HER FOR ONE ?  ?  ? ?Patient response to transportation need: ?Is the patient able to respond to transportation needs?: Yes ?In the past 12 months, has lack of transportation kept you from medical appointments or from getting medications?: No ?In the past 12 months, has lack of transportation kept you from meetings, work, or from getting things needed for daily living?: No ? ? ? ?Comments (or additional information):DAUGHTER [facility] AT [facility] AND AWARE OF MOM'S CARE NEEDS. BOTH FEEL READY TO GO HOME ? ?Patient/Family verbalized understanding of follow-up arrangements:  Yes ? ?Individual responsible for coordination of the follow-up plan: Armanda Magic [phone] ? ?Confirmed correct DME delivered: Lucy Chris 01/03/2022   ? ?Lucy Chris ?

## 2022-01-03 NOTE — Progress Notes (Signed)
? ? ? ?  Subjective: ?Patient is doing very well in rehab.  Patient reports plan for likely home tomorrow.  She has had no issues after her right hip replacement.  She does note chronic pain in the right knee that has been flared up the last few days.  She denies distal numbness and tingling.  No issues with taking the aspirin and Plavix daily.  No concerns. ?Objective:  ? ?VITALS:   ?Vitals:  ? 01/01/22 1950 01/02/22 0445 01/02/22 1543 01/03/22 0302  ?BP: 125/73 (!) 169/97 (!) 141/72 (!) 166/83  ?Pulse: 90 90 86 83  ?Resp: _0 ?Temp: 98.3 ?F (36.8 ?C) 97.6 ?F (36.4 ?C) 97.7 ?F (36.5 ?C) 97.6 ?F (36.4 ?C)  ?TempSrc: Oral  Oral   ?SpO2: 100% 98% 100% 99%  ?Weight:      ?Height:      ? ? ?Sensation intact distally ?Intact pulses distally ?Dorsiflexion/Plantar flexion intact ?Incision: dressing C/D/I ?No cellulitis present ?Compartment soft ? ? ?Lab Results  ?Component Value Date  ? WBC 8.3 12/29/2021  ? HGB 11.0 (L) 12/29/2021  ? HCT 34.0 (L) 12/29/2021  ? MCV 100.0 12/29/2021  ? PLT 203 12/29/2021  ? ?BMET ?   ?Component Value Date/Time  ? NA 134 (L) 12/29/2021 0124  ? NA 135 09/25/2021 1206  ? K 3.7 12/29/2021 0124  ? CL 101 12/29/2021 0124  ? CO2 24 12/29/2021 0124  ? GLUCOSE 125 (H) 12/29/2021 0124  ? BUN 18 12/29/2021 0124  ? BUN 14 09/25/2021 1206  ? CREATININE 0.73 12/29/2021 0124  ? CALCIUM 8.6 (L) 12/29/2021 0124  ? EGFR 92 09/25/2021 1206  ? GFRNONAA >60 12/29/2021 0124  ? ? ?Xray: Total hip arthroplasty components in good position without adverse features. ? ?Assessment/Plan: ?   ? 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 ?Post op recs: ?WB: WBAT RLE, posterior hip precautions x6 weeks ?Abx: complete 7 day course of cefadroxil 500BID ?Imaging: PACU pelvis Xray ?Dressing: Aquacell, keep intact until follow up ?DVT prophylaxis: restart patient's aspirin 97m daily POD1, and plavix 752mdaily POD2 ?Follow up: 2 weeks after surgery for a wound [facility] follow-up next Friday. ?Address: [address] [address]?Office Phone: [phone]  ? ? ? ? ?Bennie Scaff A Colvin Blatt ?01/03/2022, 6:59 AM ? ? ?DaCharlies ConstableMD ? ?Contact information:   ?Weekdays 7am-5pm epic message Dr. MaZachery Dakinsor call office for patient follow up: [phone] ?After hours and holidays please check [email or web address] for group call information for [facility] ? ?  ?

## 2022-01-03 NOTE — Progress Notes (Signed)
Physical Therapy Session Note ? ?Patient Details  ?Name: Deborah [name] ?MRN: [id number] ?Date of Birth: [birth year] ? ?Today's Date: 01/03/2022 ?PT Individual Time: 1017-5102 and 5852-7782 ?PT Individual Time Calculation (min): 60 min and ? ?Short Term Goals: ?Week 1:  PT Short Term Goal 1 (Week 1): STG=LTG due to ELOS ?Week 2:    ?Week 3:    ? ?Skilled Therapeutic Interventions/Progress Updates:  ?  AM SESSION ?Pt states she is "sore" but denies it is pain. ?Pt received as handoff from OT completing am adls at sink from wc level w/set up assist. ? ?Pt propels wc  23ft mod I. ? ?Functional assessment: ?Gait: 149ft w/RW mod I, mild antalgic gait, decreased hip extension R w/terminal stance w/compensatory pelvic/trunk retraction. ? ?Car transfer: mod I w/RW, additional time ? ?Gait on uneven surface: ambulated up/down uneven ramp x 15ft w/RW mod I. ? ?Gait in household simulated environment: 33ft mod I w/RW ? ? ?Picking up objects from floor using reacher: standing w/RW performs mod I ? ?Curb:  pt repeated ascending/descending 8in curb including ambulatory approach w/Rw mod I, repeated x 2. ? ? ?Bed mobility: performs sit to/from supine, rolling, scooting independently and also able to perform mod I using rail. ? ?Pt left supine w/rails up x 3, alarm set, bed in lowest position, and needs in reach. ? ? ? ?PM SESSION ?Pt extremely hyperverbal this pm and difficult to redirect.  Perseverated on it being her last day and insisting on telling therapist about granddaughter/gymnastics, showing therpist videos.  Therapist able to somewhat manage pt w/interrupting adequate seated rest shere pt allowed to do above and w/activity ? ?Reviewed and performed full HEP including: ?Hip abd/add ?Heel slides ?TKEs ?Standing heel raises, hip flexion, hip extension ?10 reps, 1 set each. ? ?Gait:   ? ?174ft w/RW mod I on unit including 3 turns and turn to sit. ? ?Pt ambulted short distances in room mult times using Rw independently  between standing therex, seated rest, collecting items for personal use, etc. ? ?Sit to supine mod I. ?Pt left supine w/rails up x 3, alarm set, bed in lowest position, and needs in reach. ? ?Therapy Documentation ?Precautions:  ?Precautions ?Precautions: Posterior Hip, Fall ?Precaution Comments: patient able to teach back 3/3 posterior hip precautions ?Restrictions ?Weight Bearing Restrictions: Yes ?RLE Weight Bearing: Weight bearing as tolerated ? ? ?Therapy/Group: Individual Therapy ?Rada Hay, PT ? ? ?[name] ?01/03/2022, 10:22 AM  ?

## 2022-01-03 NOTE — Progress Notes (Signed)
Physical Therapy Discharge Summary ? ?Patient Details  ?Name: Deborah [name] ?MRN: [id number] ?Date of Birth: October 08, 1950 ? ?Today's Date: 01/03/2022 ?PT Individual Time: 3419-3790 ?PT Individual Time Calculation (min): 60 min  ? ? ?Patient has met 8 of 8 long term goals due to improved activity tolerance, improved balance, increased strength, increased range of motion, decreased pain, and improved awareness.  Patient to discharge at an ambulatory level Modified Independent.   P ? ?Reasons goals not met: na ? ?Recommendation:  ?Patient will benefit from ongoing skilled PT services in home health setting to continue to advance safe functional mobility, address ongoing impairments in strength, ROM, fucntional mobility,functional gait,, and minimize fall risk. ? ?Equipment: ?No equipment provided ? ?Reasons for discharge: treatment goals met ? ?Patient/family agrees with progress made and goals achieved: Yes ? ?PT Discharge ?Precautions/Restrictions ?Precautions ?Precautions: Posterior Hip;Fall ?Restrictions ?Weight Bearing Restrictions: Yes ?RLE Weight Bearing: Weight bearing as tolerated ?Vital Signs ?na ?Pain ?Pain Assessment ?Pain Scale: 0-10 ?Pain Score: 0-No pain ?Pain Interference ?Pain Interference ?Pain Effect on Sleep: 2. Occasionally ?Pain Interference with Therapy Activities: 2. Occasionally ?Pain Interference with Day-to-Day Activities: 1. Rarely or not at all ?Vision/Perception  ?Vision - History ?Ability to See in Adequate Light: 0 Adequate  ?Cognition ?Overall Cognitive Status: History of cognitive impairments - at baseline ?Arousal/Alertness: Awake/alert ?Orientation Level: Oriented X4 ?Attention: Sustained ?Sustained Attention: Impaired ?Sensation ?Sensation ?Light Touch: Appears Intact ?Proprioception: Appears Intact ?Coordination ?Gross Motor Movements are Fluid and Coordinated: No ?Fine Motor Movements are Fluid and Coordinated: Yes ?Coordination and Movement Description: limited RLE due to weakness  at hip and knee ?Motor  ?Motor ?Motor: Abnormal postural alignment and control ?Motor - Discharge Observations: Pt w/age related postural changes but balance w/AD wfls  ?Mobility ?Bed Mobility ?Bed Mobility: Supine to Sit;Sit to Supine ?Supine to Sit: Independent with assistive device ?Sit to Supine: Independent with assistive device ?Transfers ?Transfers: Sit to Stand;Stand to [employer] Transfers ?Sit to Stand: Independent with assistive device ?Stand to Sit: Independent with assistive device ?Stand Pivot Transfers: Independent with assistive device ?Transfer (Assistive device): Rolling walker ?Locomotion  ?Gait ?Gait Assistance: Independent with assistive device ?Gait Distance (Feet): 150 Feet ?Assistive device: Rolling walker ?Gait ?Gait: Yes ?Gait Pattern: Decreased weight shift to right;Antalgic (decreased extension R hip w/terminal stance/compensates w/mild trunk rotation) ?Gait velocity: decreased ?Stairs / Additional Locomotion ?Stairs: Yes ?Stairs Assistance: Independent with assistive device ?Stair Management Technique: With walker ?Number of Stairs: 1 ?Height of Stairs: 8 ?Ramp: Independent with assistive device ?Curb: Independent with assistive device ?Pick up small object from the floor assist level: Independent with assistive device ?Pick up small object from the floor assistive device: reacher, walker ?Wheelchair Mobility ?Wheelchair Mobility: Yes ?Wheelchair Assistance: Independent with assistive device ?Wheelchair Propulsion: Both upper extremities ?Distance: 75  ?Trunk/Postural Assessment  ?Cervical Assessment ?Cervical Assessment: Exceptions to [facility] ?Cervical AROM ?Overall Cervical AROM: Deficits;Due to premorid status ?Overall Cervical AROM Comments: limited cervical rotation due to spinal fusion ?Thoracic Assessment ?Thoracic Assessment: Exceptions to [facility] (kyphosis) ?Postural Control ?Postural Control: Deficits on evaluation (delayed due to R THA, decreased strength, decrased wbing  tolerance requiring use of walker)  ?Balance ?Balance ?Balance Assessed: Yes ?Static Sitting Balance ?Static Sitting - Level of Assistance: 7: Independent ?Dynamic Sitting Balance ?Dynamic Sitting - Balance Support: Feet supported ?Dynamic Sitting - Level of Assistance: 7: Independent ?Dynamic Sitting - Balance Activities: Reaching for objects ?Static Standing Balance ?Static Standing - Balance Support: Bilateral upper extremity supported;During functional activity ?Static Standing - Level of Assistance:  7: Independent ?Extremity Assessment  ?RUE Assessment ?RUE Assessment: Within Functional Limits ?LUE Assessment ?LUE Assessment: Within Functional Limits ?RLE Assessment ?RLE Assessment: Exceptions to [facility] ?Active Range of Motion (AROM) Comments: hip abd at least 2/5, add 2-/5 w/pain, ?General Strength Comments: hip abd at least 2/5, add 2-/5 w/pain,hip flexion 3-/5 , Quads at least 4/5, HS at least 3//5, DF/PF 5/5 ?LLE Assessment ?LLE Assessment: Within Functional Limits ? ? ? ?Jerrilyn Cairo ?01/03/2022, 10:42 AM ?

## 2022-01-03 NOTE — Progress Notes (Signed)
Occupational Therapy Discharge Summary ? ?Patient Details  ?Name: Deborah [name] ?MRN: [id number] ?Date of Birth: 04/29/1951 ? ?Today's Date: 01/03/2022 ?OT Individual Time: 6599-3570 then 2nd visit 1300-1400  ?OT Individual Time Calculation (min): 60 min 1st visit 60 min 2nd visit  ? ?OT Treatment and Discharge visit info: AM visit: Pt seen for AM ADL and mobility training including bathing shower bench level, toileting with elevated toilet seat and bar and dressing and grooming sinkside both seated and standing levels. OT conducted comprehensive AE training and pt able to integrate and recall 3/3 THA posterior precautions. Pt now at Mod I level for self care, toielting and shower routine in CIR setting. Will benefit from [facility] OT to ensure indep in home setting. Pt handed off to PT for next session.  ? ?PM visit: Pt worked with OT on IADL's in demo apartment on unit. OT training with use of AE for obtaining items from kitchen cabinets, pantry closet and fridge amb level with reacher and RW. OT educated on all THA prec and patient able to teach bck with mod I. OT training on strategies to increase access to dresser drawers and needs for item placement planning at home. Pt confirmed her daughter would purchase hip kit online and was setting up bathroom for safety. Pt transported w/c level to and from the demo apartment on unit for time. No pain reported throughout sessions. OT set patient back up in bed for rest prior to PT with call bell, bed alarm and needs in place. No further OT needs in this setting with discharge completed.  ? ? ?D/c status: ?Patient has met 8 of 8 long term goals due to improved activity tolerance, improved balance, postural control, and ability to compensate for deficits.  Patient to discharge at overall Modified Independent level.  Patient's care partner is independent to provide the necessary physical assistance at discharge.   ? ?Reasons goals not met: n/a  ? ?Recommendation:  ?Patient will  benefit from ongoing skilled OT services in home health setting to continue to advance functional skills in the area of BADL and iADL. ? ?Equipment: ?Link for THA kit for AE due to posterior hip precautions info provided to daughter, pt has reacher x 2 and shower and toileting DME  ? ?Reasons for discharge: treatment goals met ? ?Patient/family agrees with progress made and goals achieved: Yes ? ?OT Discharge ?Precautions/Restrictions  ?Precautions ?Precautions: Posterior Hip;Fall ?Precaution Comments: patient able to teach back 3/3 posterior hip precautions ?Restrictions ?Weight Bearing Restrictions: Yes ?RLE Weight Bearing: Weight bearing as tolerated ?General ?  ?Vital Signs ?Therapy Vitals ?Temp: 97.9 ?F (36.6 ?C) ?Temp Source: Oral ?Pulse Rate: 96 ?Resp: 17 ?BP: 129/81 ?Patient Position (if appropriate): Lying ?Oxygen Therapy ?SpO2: 100 % ?O2 Device: Room Air ?Pain ?Pain Assessment ?Pain Scale: 0-10 ?Pain Score: 0-No pain ?ADL ?ADL ?Equipment Provided: Reacher, Sock aid, Long-handled shoe horn, Long-handled sponge ?Eating: Independent ?Where Assessed-Eating: Edge of bed ?Grooming: Modified independent ?Where Assessed-Grooming: Standing at sink ?Upper Body Bathing: Modified independent ?Where Assessed-Upper Body Bathing: Shower ?Lower Body Bathing: Modified independent ?Where Assessed-Lower Body Bathing: Shower ?Upper Body Dressing: Independent ?Where Assessed-Upper Body Dressing: Edge of bed ?Lower Body Dressing: Modified independent ?Where Assessed-Lower Body Dressing: Sitting at sink ?Toileting: Modified independent ?Where Assessed-Toileting: Toilet ?Toilet Transfer: Modified independent ?Toilet Transfer Method: Stand pivot, Ambulating ?Toilet Transfer Equipment: Raised toilet seat ?Tub/Shower Transfer: Unable to assess ?Tub/Shower Transfer Method: Unable to assess ?Walk-In Shower Transfer: Modified independent ?Walk-In Shower Transfer Method: Stand pivot, Ambulating ?  Walk-In Shower Equipment: Civil engineer, contracting  with back, Grab bars ?ADL Comments: Mod I with AE for posterior hip prec ?Vision ?Baseline Vision/History: 1 Wears glasses ?Patient Visual Report: No change from baseline ?Vision Assessment?: No apparent visual deficits ?Perception  ?Perception: Within Functional Limits ?Praxis ?Praxis: Intact ?Cognition ?Cognition ?Overall Cognitive Status: History of cognitive impairments - at baseline ?Arousal/Alertness: Awake/alert ?Orientation Level: Person;Place;Situation ?Person: Oriented ?Place: Oriented ?Situation: Oriented ?Memory: Appears intact ?Attention: Sustained ?Sustained Attention: Impaired ?Sustained Attention Impairment: Verbal complex ?Awareness: Appears intact ?Problem Solving: Appears intact ?Safety/Judgment: Appears intact ?Comments: Significantly improved due to increased knowlege of rehab routine and staff ?Brief Interview for Mental Status (BIMS) ?Repetition of Three Words (First Attempt): 3 ?Temporal Orientation: Year: Correct ?Temporal Orientation: Month: Accurate within 5 days ?Temporal Orientation: Day: Correct ?Recall: "Sock": Yes, no cue required ?Recall: "Blue": Yes, no cue required ?Recall: "Bed": Yes, no cue required ?BIMS Summary Score: 15 ?Sensation ?Sensation ?Light Touch: Appears Intact ?Hot/Cold: Appears Intact ?Proprioception: Appears Intact ?Stereognosis: Appears Intact ?Coordination ?Gross Motor Movements are Fluid and Coordinated: Yes ?Fine Motor Movements are Fluid and Coordinated: Yes ?Motor  ?Motor ?Motor: Abnormal postural alignment and control ?Motor - Skilled Clinical Observations: decreased postural control ?Motor - Discharge Observations: Pt w/age related postural changes but balance w/AD wfls ?Mobility  ?Bed Mobility ?Bed Mobility: Supine to Sit;Sit to Supine ?Rolling Right: Independent with assistive device ?Supine to Sit: Independent with assistive device ?Sit to Supine: Independent with assistive device ?Transfers ?Sit to Stand: Independent with assistive device ?Stand to Sit:  Independent with assistive device  ?Trunk/Postural Assessment  ?Cervical Assessment ?Cervical Assessment: Exceptions to [facility] ?Cervical AROM ?Overall Cervical AROM: Deficits;Due to premorid status ?Overall Cervical AROM Comments: limited cervical rotation due to spinal fusion ?Thoracic Assessment ?Thoracic Assessment: Exceptions to [facility] (due to hx of spinal fusion and degenerative changes) ?Lumbar Assessment ?Lumbar Assessment: Within Functional Limits ?Postural Control ?Postural Control: Deficits on evaluation  ?Balance ?Balance ?Balance Assessed: Yes ?Static Sitting Balance ?Static Sitting - Balance Support: Feet supported ?Static Sitting - Level of Assistance: 7: Independent ?Dynamic Sitting Balance ?Dynamic Sitting - Balance Support: Feet supported ?Dynamic Sitting - Level of Assistance: 7: Independent ?Dynamic Sitting - Balance Activities: Reaching for objects ?Static Standing Balance ?Static Standing - Balance Support: Bilateral upper extremity supported;During functional activity ?Static Standing - Level of Assistance: 7: Independent ?Dynamic Standing Balance ?Dynamic Standing - Balance Support: During functional activity;Left upper extremity supported ?Dynamic Standing - Level of Assistance: 6: Modified independent (Device/Increase time) ?Dynamic Standing - Comments: during ADL's with unilateral hand hold for support ?Extremity/Trunk Assessment ?RUE Assessment ?RUE Assessment: Within Functional Limits ?LUE Assessment ?LUE Assessment: Within Functional Limits ? ? ?Barnabas Lister ?01/03/2022, 4:10 PM ?

## 2022-01-04 NOTE — Progress Notes (Signed)
?                                                       PROGRESS NOTE ? ? ?Subjective/Complaints: ? ?Pt reports no complaints today. Daughter and granddaughter in room getting her ready for discharge. Going home today.  Reports that PT has given good exercises to use at home. ? ?ROS: ? ?Pt denies SOB, abd pain, CP, N/V/C/ (+)D, and vision changes. No fever or chills. ? ? ?Objective: ?  ?No results found. ?No results for input(s): WBC, HGB, HCT, PLT in the last 72 hours. ? ?No results for input(s): NA, K, CL, CO2, GLUCOSE, BUN, CREATININE, CALCIUM in the last 72 hours. ? ? ?Intake/Output Summary (Last 24 hours) at 01/04/2022 1128 ?Last data filed at 01/04/2022 0700 ?Gross per 24 hour  ?Intake 617 ml  ?Output --  ?Net 617 ml  ?  ? ?  ? ?Physical Exam: ?Vital Signs ?Blood pressure 140/76, pulse 75, temperature 98 ?F (36.7 ?C), resp. rate 18, height 5\' 3"  (1.6 m), weight 64.3 kg, SpO2 98 %. ? ? ? ?General: awake, alert, appropriate, sitting up in bed; NAD ?HENT: conjugate gaze; oropharynx moist ?CV: regular rate; no JVD ?Pulmonary: CTA B/L; no W/R/R- good air movement, non-labored breathing ?GI: soft, NT, ND, (+)BS ?Psychiatric: appropriate, normal effect, very pleasant ?Neurological: Ox3 ?Skin- hip incision C/D/I ?Moving all 4 extremities ? ?Musculoskeletal:     ?   General: No swelling or tenderness.  ?   Comments: Thoraco lumbar incision instumentation protruding subcutaneously along the thoracic and lumbar spine  ?Skin: ?   General: Skin is warm and dry.  ?   Comments: Right hip incision with aquacell dressing, C/D/I ?Neurological:  ?   General: No focal deficit present.  ?   Mental Status: She is alert and oriented to person, place, and time.  ?   Comments: 5/5 in BUE and LLE ?3- Left knee ext 5/5/ ankle Df/PF, HF too painful to perform  ? ?Assessment/Plan: ?1. Functional deficits which require 3+ hours per day of interdisciplinary therapy in a comprehensive inpatient rehab setting. ?Physiatrist is providing close  team supervision and 24 hour management of active medical problems listed below. ?Physiatrist and rehab team continue to assess barriers to discharge/monitor patient progress toward functional and medical goals ? ?Care Tool: ? ?Bathing ?   ?Body parts bathed by patient: Right arm, Left arm, Chest, Abdomen, Front perineal area, Buttocks, Right upper leg, Left upper leg, Face, Left lower leg, Right lower leg  ? Body parts bathed by helper: Right lower leg ?  ?  ?Bathing assist Assist Level: Independent with assistive device ?Assistive Device Comment: AE eduction for posterior hip prec trainign completed ?  ?Upper Body Dressing/Undressing ?Upper body dressing   ?What is the patient wearing?: Bra, Pull over shirt ?   ?Upper body assist Assist Level: Independent ?   ?Lower Body Dressing/Undressing ?Lower body dressing ? ? ?   ?What is the patient wearing?: Underwear/pull up, Pants ? ?  ? ?Lower body assist Assist for lower body dressing: Independent with assitive device ?Assistive Device Comment: AE eduction for posterior hip prec trainign completed  ? ?Toileting ?Toileting    ?Toileting assist Assist for toileting: Independent with assistive device ?Assistive Device Comment: elevated toilet ?  ?Transfers ?Chair/bed transfer ? ?Transfers assist ?   ? ?  Chair/bed transfer assist level: Independent with assistive device ?Chair/bed transfer assistive device: Walker ?  ?Locomotion ?Ambulation ? ? ?Ambulation assist ? ?   ? ?Assist level: Independent with assistive device ?Assistive device: Walker-rolling ?Max distance: 150  ? ?Walk 10 feet activity ? ? ?Assist ?   ? ?Assist level: Independent with assistive device ?Assistive device: Walker-rolling  ? ?Walk 50 feet activity ? ? ?Assist   ? ?Assist level: Independent with assistive device ?Assistive device: Walker-rolling  ? ? ?Walk 150 feet activity ? ? ?Assist Walk 150 feet activity did not occur: Safety/medical concerns ? ?Assist level: Independent with assistive  device ?Assistive device: Walker-rolling ?  ? ?Walk 10 feet on uneven surface  ?activity ? ? ?Assist Walk 10 feet on uneven surfaces activity did not occur: Safety/medical concerns ? ? ?Assist level: Independent with assistive device ?Assistive device: Walker-rolling  ? ?Wheelchair ? ? ? ? ?Assist Is the patient using a wheelchair?: Yes ?Type of Wheelchair: Manual ?  ? ?Wheelchair assist level: Independent ?Max wheelchair distance: 50  ? ? ?Wheelchair 50 feet with 2 turns activity ? ? ? ?Assist ? ?  ?  ? ? ?Assist Level: Independent  ? ?Wheelchair 150 feet activity  ? ? ? ?Assist ?   ? ? ?Assist Level: Moderate Assistance - Patient 50 - 74%  ? ?Blood pressure 140/76, pulse 75, temperature 98 ?F (36.7 ?C), resp. rate 18, height 5\' 3"  (1.6 m), weight 64.3 kg, SpO2 98 %. ? ?Medical Problem List and Plan: ?1. Functional deficits secondary to Right hip avascular necrosis , s/p THR, post approach ?            -patient may  shower 4d post op with wound covered ?            -ELOS/Goals: 10-12d Mod I goals ? D/c 01/04/22 ? Con't CIR- PT and OT- posterior hip precautions ? -Ortho f/u with Dr Zachery Dakins 2 weeks after surgery ?5/6 Pt has d/c plans for her follow-up appointments ?2.  Antithrombotics: ?-DVT/anticoagulation:  Pharmaceutical: Lovenox ?5/2- Ortho Surgery stopped Lovenox- and put on ASA daily and Plavix-  ?            -antiplatelet therapy: ASA 81mg  /day  ?3. Pain Management: Hydrocodone mod/sev pain, tylenol for mild, on Gabapentin chronic neuropathic pain ? 5/2- pain usually controlled- takes norco only when really bad per pt.  ? 5/4 pain reported to be controlled with current meds, continue current ?4. Mood: on hight dose zoloft as well as dextro amphetamine ? 5/1- will start Melatonin for sleep 3 mg QHS ?            -antipsychotic agents: none ? 5/4 Mood is overall good today, continue to follow ? -5/5 Mood good today ?-5/6 Good mood today, going home. ?5. Neuropsych: This patient is capable of making decisions on  her own behalf. ?6. Skin/Wound Care: routine skin care , post op dressing x 7d then may remove and replace with foam dressing  ?7. Fluids/Electrolytes/Nutrition: regular diet monitor I an Os, CMET in am  ? 5/1- will give purewick at night for now and check PVRs q6 hours- x10 episodes, unless retaining. ?5/2- voiding well- no bladder scans seen  ? 5/3- still voiding a lot overnight- con't purewick as required ?8.  Hx Scoliosis s/p T2-S1 fusion ?9.  Hx cervical spine fx without SCI s/p C1-2 fusion  ?10.  Hx CAD s/p cardiac stent ASA and Plavix daily  ? -Denies chest pain, continue to monitor ?  5/6 Currently no symptoms, stable for d/c ?11. HTN- ? 5/2- BP elevated this AM- 158/84- will monitor for trend before changing anything. ?5/3- BP 140s/80s- if stays up, will start something ?5/5 - BP well controlled this AM, continue to monitor ?5/6 B/P stable and controlled for discharge ? ?  01/04/2022  ?  2:56 AM 01/03/2022  ?  8:05 PM 01/03/2022  ?  3:56 PM  ?Vitals with BMI  ?Systolic [id number]  ?Diastolic 76 70 81  ?Pulse 75 85 96  ?  ?12. Hip surgery- was on Cephalosporin PO- will sotp since Cx came back (-)  ?  ?  ?Spent 45 min on discharge planning, patient and family education, include medication list and discharge instructions along with physical exam today. ? ? ?LOS: ?6 days ?A FACE TO FACE EVALUATION WAS PERFORMED ? ?Deborah [name] ?01/04/2022, 11:28 AM  ? ? ? ?

## 2022-01-04 NOTE — Progress Notes (Addendum)
Pt verbalizes understanding of discharge instructions given per PA yesterday.  Discharge order received per NP. Pt has no further questions regarding instructions. Pt belongings gathered, pt left per wheelchair to private vehicle. No complications noted. ?Mylo Red, LPN  ?

## 2022-01-04 NOTE — Progress Notes (Signed)
Inpatient Rehabilitation Discharge Medication Review by a Pharmacist ? ?A complete drug regimen review was completed for this patient to identify any potential clinically significant medication issues. ? ?High Risk Drug Classes Is patient taking? Indication by Medication  ?Antipsychotic No   ?Anticoagulant Yes   ?Antibiotic Yes Valtrex - herpes px  ?Opioid Yes Vicodin for pain  ?Antiplatelet Yes Plavix - CAD ?Aspirin- CAD  ?Hypoglycemics/insulin No   ?Vasoactive Medication Yes Coreg - HTN  ?Chemotherapy No   ?Other Yes Adderall - ADHD ?Zoloft - depression ?Synthroid - hypothyroidism ?Gabapentin - neuropathy  ? ? ? ?Type of Medication Issue Identified Description of Issue Recommendation(s)  ?Drug Interaction(s) (clinically significant) ?    ?Duplicate Therapy ?    ?Allergy ?    ?No Medication Administration End Date ?    ?Incorrect Dose ?    ?Additional Drug Therapy Needed ?    ?Significant med changes from prior encounter (inform family/care partners about these prior to discharge).    ?Other ?    ? ? ?Clinically significant medication issues were identified that warrant physician communication and completion of prescribed/recommended actions by midnight of the next day:  No ? ? ? ?Pharmacist comments: None ? ?Time spent performing this drug regimen review (minutes):  15 ? ? ?Rushie Goltz ?01/04/2022 7:58 AM ?

## 2022-03-18 ENCOUNTER — Telehealth: Payer: Self-pay | Admitting: Cardiology

## 2022-03-18 NOTE — Telephone Encounter (Signed)
   Patient Name: Deborah [name]  DOB: Jul 27, 1951 MRN: [id number]  Primary Cardiologist: Norman Herrlich, MD  Chart reviewed as part of pre-operative protocol coverage.  Hx of She had acute anterior MI with VF in 2006 with PCI and Taxus stent mid LAD. Subsequently in 2018 she had restenosis PCI and Cutting Balloon with ejection fraction 65%.  Patient remain on DAPT. Dr. Dulce Sellar, please provide recommendations regarding holding Plavix. Please forward your response to P CV DIV PREOP.   Thank you   Manson Passey, PA 03/18/2022, 1:44 PM

## 2022-03-18 NOTE — Telephone Encounter (Signed)
   Pre-operative Risk Assessment    Patient Name: Deborah [name]  DOB: [birth year] MRN: [id number]      Request for Surgical Clearance    Procedure:  Left Breast Stereotactic Biopsy  Date of Surgery:  Clearance  03-26-22                                  Surgeon:  Dr Antonietta Jewel Surgeon's Group or Practice Name:   Phone number:  [phone] Fax number:  [phone]   Type of Clearance Requested:   - Pharmacy:  Hold Clopidogrel (Plavix) Can patient hold her Plavix for 5 days prior   Type of Anesthesia:  None - local   Additional requests/questions:    Signed, Laurence Ferrari   03/18/2022, 12:46 PM

## 2022-03-24 NOTE — Telephone Encounter (Signed)
   Patient Name: Deborah [name]  DOB: 03-14-1951 MRN: [id number]  Primary Cardiologist: Norman Herrlich, MD  Chart reviewed as part of pre-operative protocol coverage. Given past medical history and time since last visit, based on ACC/AHA guidelines, Deborah [name] would be at acceptable risk for the planned procedure without further cardiovascular testing.   Per Dr. Dulce Sellar, primary cardiologist, patient may hold Plavix for 5 days prior to procedure.  Please resume Plavix as soon as possible postprocedure, at the discretion of the surgeon.  I will route this recommendation to the requesting party via Epic fax function and remove from pre-op pool.  Please call with questions.  Joylene Grapes, NP 03/24/2022, 11:10 AM

## 2022-07-28 ENCOUNTER — Other Ambulatory Visit: Payer: Self-pay | Admitting: Cardiology

## 2022-07-28 DIAGNOSIS — I25118 Atherosclerotic heart disease of native coronary artery with other forms of angina pectoris: Secondary | ICD-10-CM

## 2022-07-28 NOTE — Telephone Encounter (Signed)
Rx refill sent to pharmacy. 

## 2022-09-18 ENCOUNTER — Other Ambulatory Visit: Payer: Self-pay | Admitting: Cardiology

## 2022-09-18 NOTE — Telephone Encounter (Signed)
Clopidogrel 75 mg # 90 only sent to  [facility] [place], [place] AT [address]

## 2022-10-17 ENCOUNTER — Other Ambulatory Visit: Payer: Self-pay

## 2022-10-20 ENCOUNTER — Ambulatory Visit: Payer: Medicare HMO | Attending: Cardiology | Admitting: Cardiology

## 2022-10-20 ENCOUNTER — Encounter: Payer: Self-pay | Admitting: Cardiology

## 2022-10-20 VITALS — BP 114/68 | HR 72 | Ht 62.0 in | Wt 141.0 lb

## 2022-10-20 DIAGNOSIS — I2510 Atherosclerotic heart disease of native coronary artery without angina pectoris: Secondary | ICD-10-CM

## 2022-10-20 DIAGNOSIS — I10 Essential (primary) hypertension: Secondary | ICD-10-CM | POA: Diagnosis not present

## 2022-10-20 DIAGNOSIS — I25118 Atherosclerotic heart disease of native coronary artery with other forms of angina pectoris: Secondary | ICD-10-CM | POA: Diagnosis not present

## 2022-10-20 DIAGNOSIS — E782 Mixed hyperlipidemia: Secondary | ICD-10-CM

## 2022-10-20 MED ORDER — CLOPIDOGREL BISULFATE 75 MG PO TABS: 75.0000 mg | ORAL_TABLET | Freq: Every day | ORAL | 3 refills | Status: DC

## 2022-10-20 MED ORDER — CARVEDILOL 3.125 MG PO TABS: 3.1250 mg | ORAL_TABLET | Freq: Two times a day (BID) | ORAL | 3 refills | Status: DC
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
## 2022-10-20 NOTE — Patient Instructions (Signed)

## 2022-10-20 NOTE — Addendum Note (Signed)
Addended by: Jerl Santos R on: 10/20/2022 04:08 PM   Modules accepted: Orders

## 2022-12-05 ENCOUNTER — Ambulatory Visit (HOSPITAL_COMMUNITY): Payer: Medicare HMO

## 2022-12-08 ENCOUNTER — Ambulatory Visit (HOSPITAL_COMMUNITY)
Admission: RE | Admit: 2022-12-08 | Discharge: 2022-12-08 | Disposition: A | Discharge status: Home / Self Care | Payer: Medicare HMO | Source: Ambulatory Visit | Attending: Vascular Surgery | Admitting: Vascular Surgery

## 2022-12-08 ENCOUNTER — Other Ambulatory Visit (HOSPITAL_COMMUNITY): Payer: Self-pay | Admitting: Internal Medicine

## 2022-12-08 DIAGNOSIS — R609 Edema, unspecified: Secondary | ICD-10-CM

## 2022-12-27 IMAGING — CR DG HIP (WITH OR WITHOUT PELVIS) 1V PORT*R*
1 series · 1 of 1 positions shown · non-contrast
Comparison: December 25, 2021.

CLINICAL DATA: Right hip replacement.

EXAM:
DG HIP (WITH OR WITHOUT PELVIS) 1V PORT RIGHT

[AP]
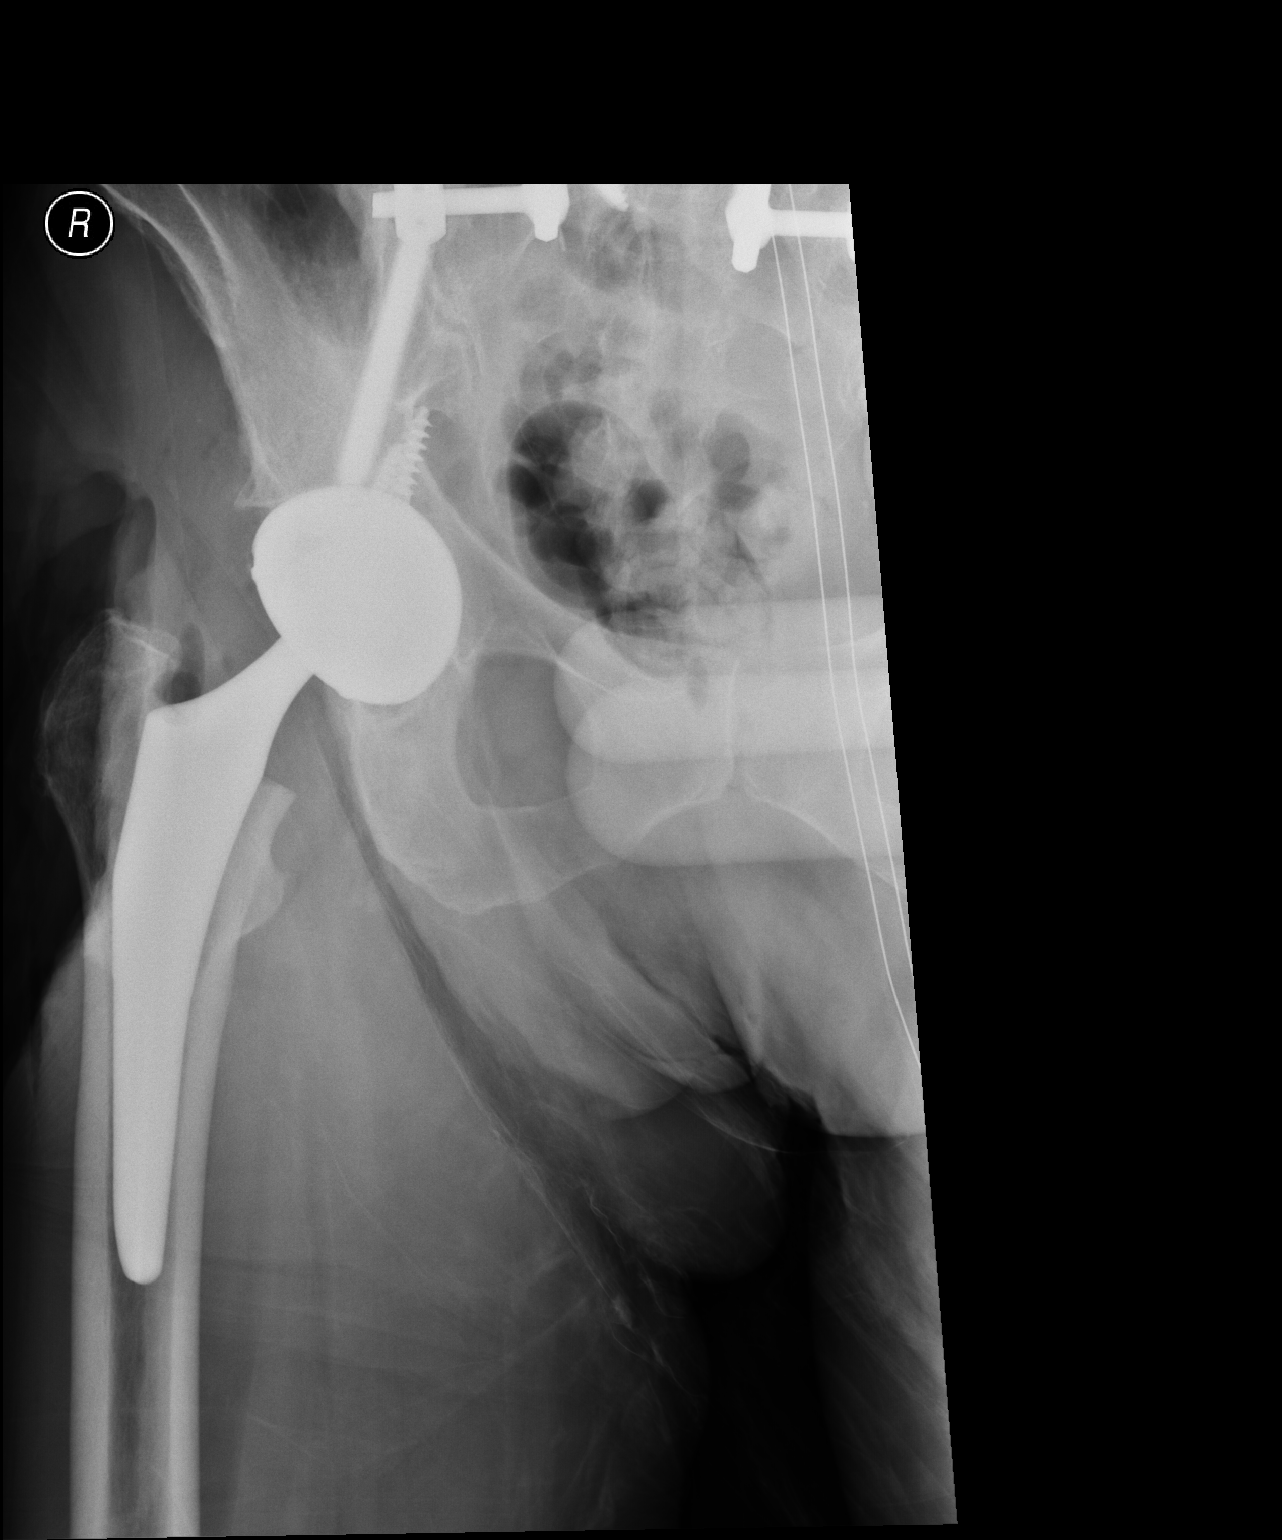

[1 of 1 positions shown; findings below may reference images not displayed]

FINDINGS: Single intraoperative cross-table lateral projection of the right
hip was obtained. Right acetabular and femoral components are well
situated. Expected postoperative changes are seen in the surrounding
soft tissues.
IMPRESSION: Status post right total hip arthroplasty.

## 2022-12-27 IMAGING — DX DG HIP (WITH OR WITHOUT PELVIS) 2-3V*R*
2 series · 2 of 2 positions shown · non-contrast
Comparison: Film from earlier in the same day.

CLINICAL DATA: Status post right hip arthroplasty

EXAM:
DG HIP (WITH OR WITHOUT PELVIS) 2V RIGHT

[pelvis]
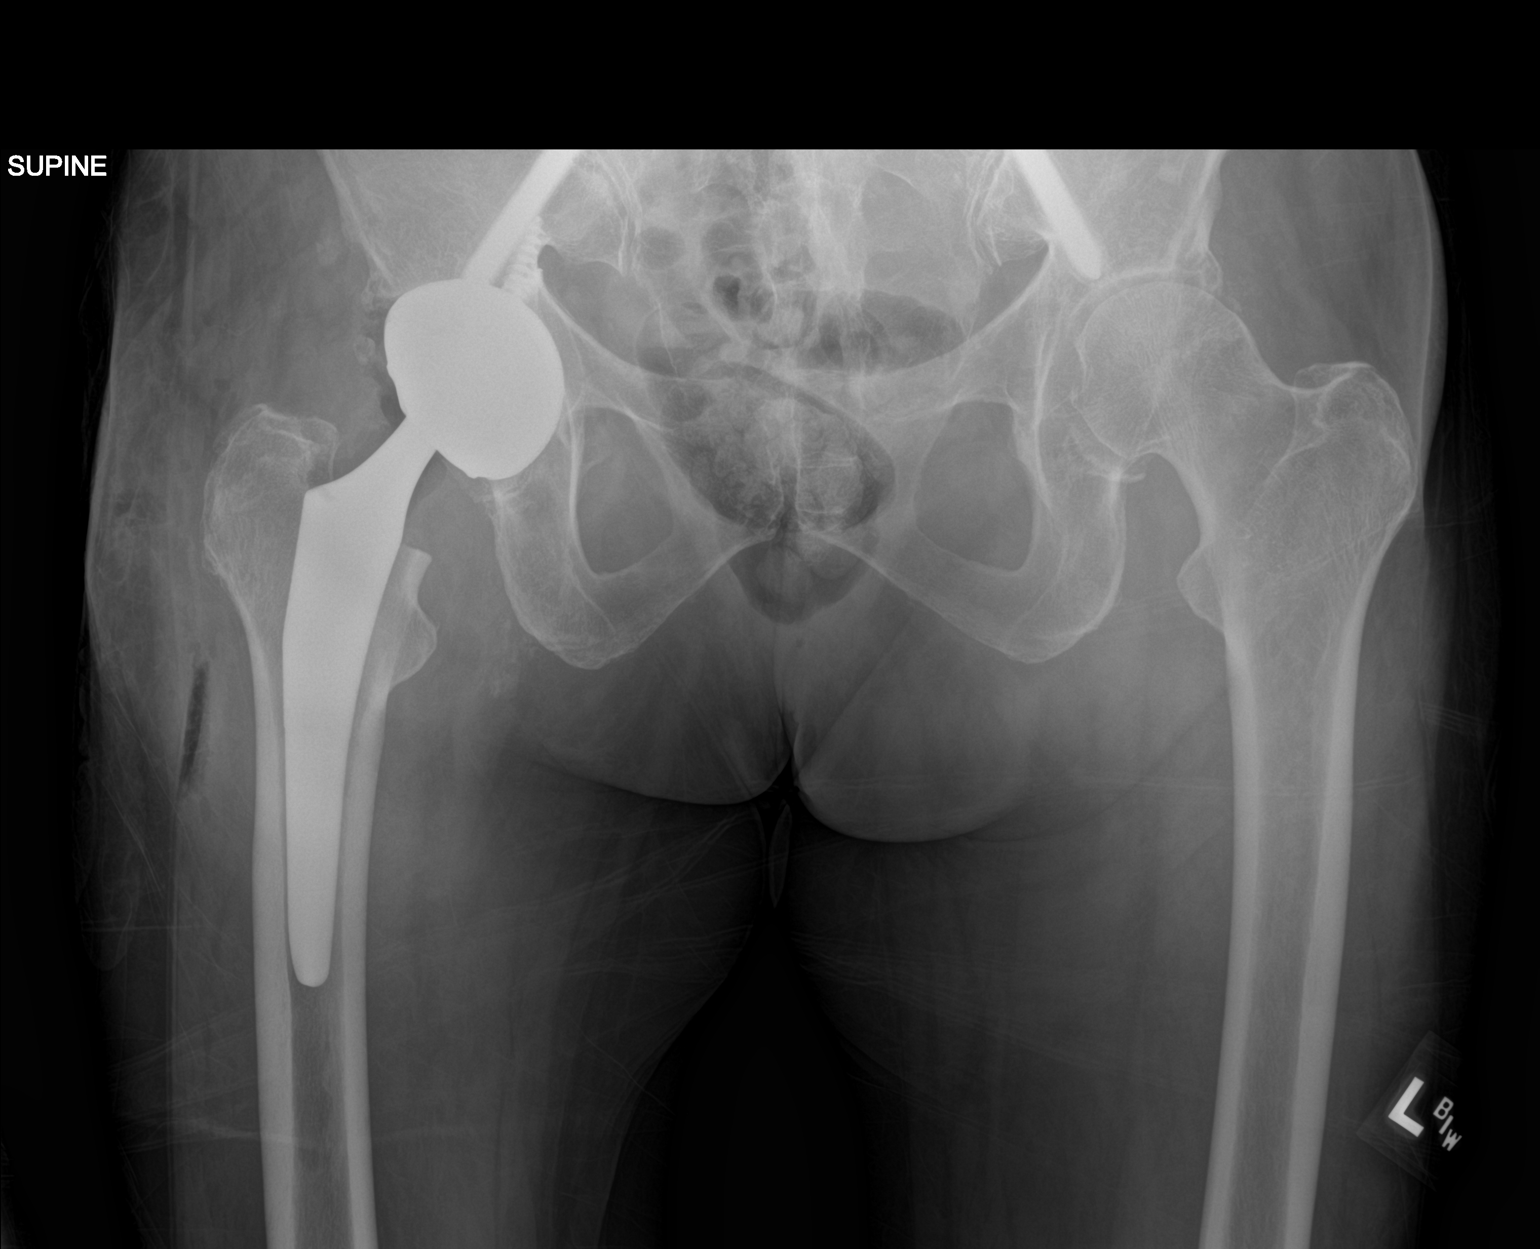

[hip]
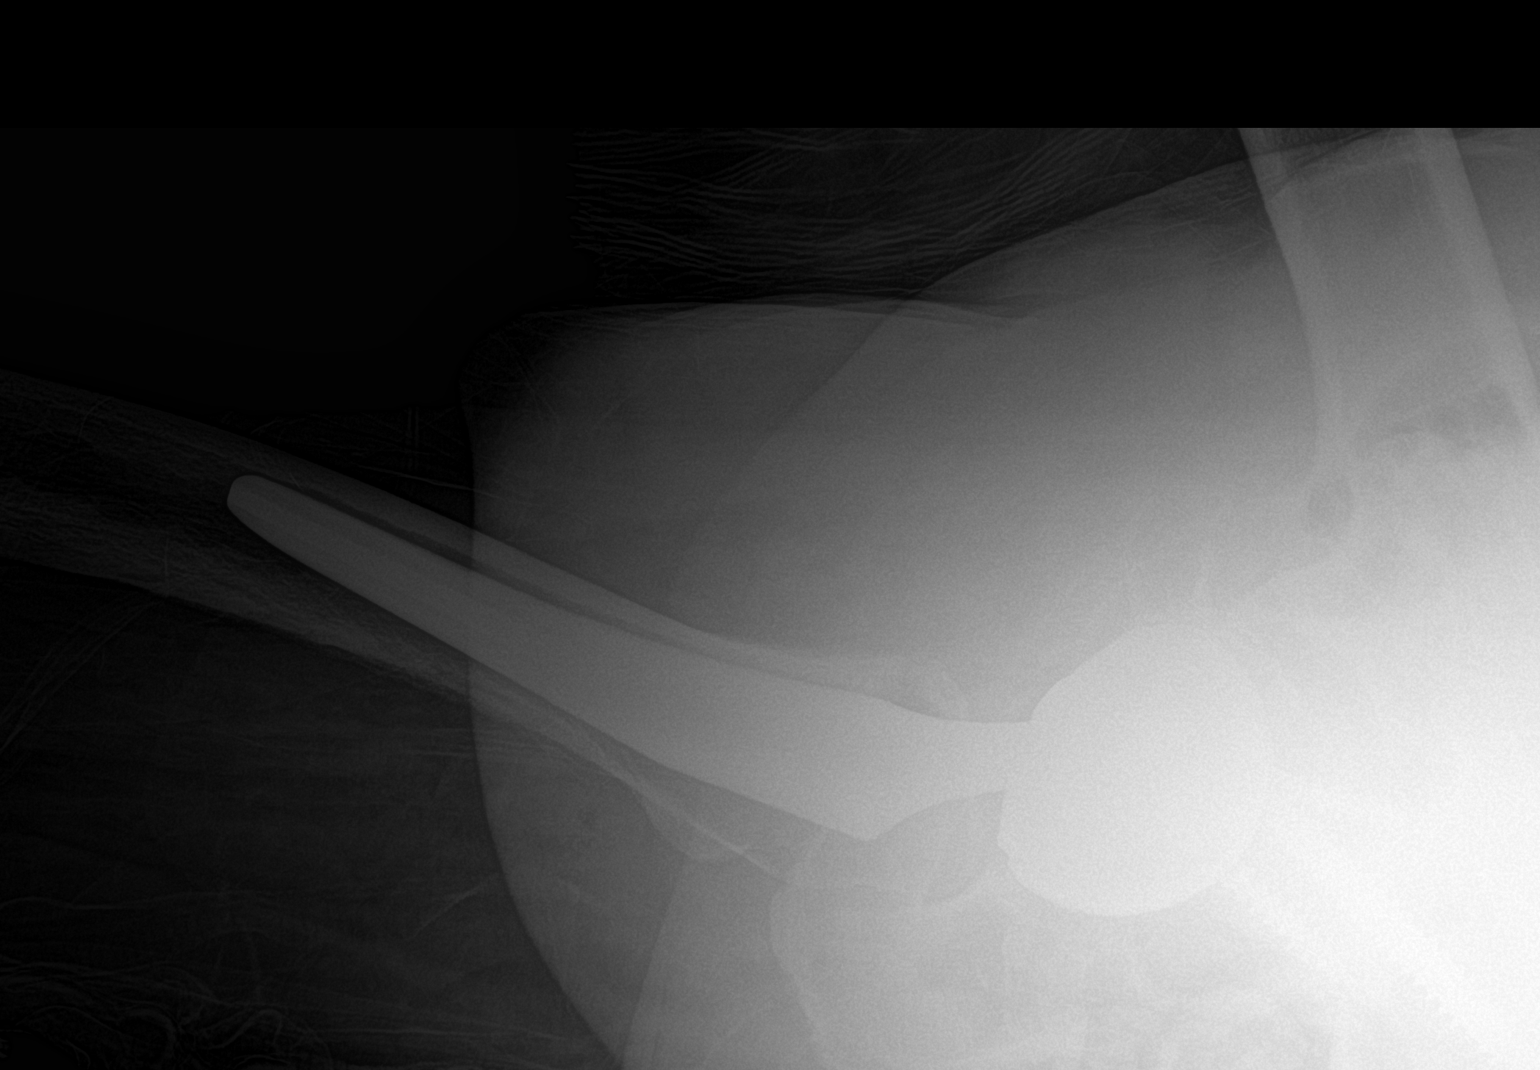

[2 of 2 positions shown; findings below may reference images not displayed]

FINDINGS: Right hip replacement is noted similar to that noted on the prior
postoperative film. Postsurgical changes in the lower lumbar spine
and sacrum are again seen and stable. No acute fracture or
dislocation is noted. No soft tissue changes are seen.
IMPRESSION: Status post right hip replacement stable from prior exam.

## 2023-03-27 ENCOUNTER — Other Ambulatory Visit: Payer: Self-pay | Admitting: Cardiology

## 2023-04-06 ENCOUNTER — Encounter: Payer: Self-pay | Admitting: Obstetrics and Gynecology

## 2023-04-06 ENCOUNTER — Other Ambulatory Visit (HOSPITAL_COMMUNITY)
Admission: RE | Admit: 2023-04-06 | Discharge: 2023-04-06 | Disposition: A | Discharge status: Home / Self Care | Payer: Medicare HMO | Source: Other Acute Inpatient Hospital | Attending: Obstetrics and Gynecology | Admitting: Obstetrics and Gynecology

## 2023-04-06 ENCOUNTER — Ambulatory Visit: Payer: Medicare HMO | Admitting: Obstetrics and Gynecology

## 2023-04-06 VITALS — BP 117/71 | HR 79 | Ht 61.3 in | Wt 131.0 lb

## 2023-04-06 DIAGNOSIS — N993 Prolapse of vaginal vault after hysterectomy: Secondary | ICD-10-CM

## 2023-04-06 DIAGNOSIS — N816 Rectocele: Secondary | ICD-10-CM

## 2023-04-06 DIAGNOSIS — N393 Stress incontinence (female) (male): Secondary | ICD-10-CM | POA: Diagnosis not present

## 2023-04-06 DIAGNOSIS — N952 Postmenopausal atrophic vaginitis: Secondary | ICD-10-CM

## 2023-04-06 DIAGNOSIS — R82998 Other abnormal findings in urine: Secondary | ICD-10-CM

## 2023-04-06 DIAGNOSIS — R350 Frequency of micturition: Secondary | ICD-10-CM | POA: Diagnosis not present

## 2023-04-06 LAB — POCT URINALYSIS DIPSTICK
Bilirubin, UA: NEGATIVE
Blood, UA: NEGATIVE
Glucose, UA: NEGATIVE
Ketones, UA: NEGATIVE
Nitrite, UA: NEGATIVE
Protein, UA: NEGATIVE
Spec Grav, UA: 1.02 (ref 1.010–1.025)
Urobilinogen, UA: 0.2 E.U./dL
pH, UA: 6.5 (ref 5.0–8.0)

## 2023-04-06 MED ORDER — ESTRADIOL 0.1 MG/GM VA CREA
0.5000 g | TOPICAL_CREAM | VAGINAL | 11 refills | Status: AC
Start: 2023-04-06 — End: ?
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
## 2023-04-06 NOTE — Patient Instructions (Addendum)
Start vaginal estrogen cream. Please use this nightly for 2 weeks and then twice weekly.   Consider options between surgical repair and pessary. We can start with pessary.   You have stage 3 out of 4 rectal wall prolapse.

## 2023-04-08 MED ORDER — SULFAMETHOXAZOLE-TRIMETHOPRIM 800-160 MG PO TABS
1.0000 | ORAL_TABLET | Freq: Two times a day (BID) | ORAL | 0 refills | Status: AC
Start: 2023-04-08 — End: 2023-04-11

## 2023-04-08 NOTE — Addendum Note (Signed)
Addended by: Selmer Dominion on: 04/08/2023 10:09 AM   Modules accepted: Orders

## 2023-05-05 ENCOUNTER — Ambulatory Visit: Payer: Medicare HMO | Admitting: Obstetrics and Gynecology

## 2023-05-05 ENCOUNTER — Encounter: Payer: Self-pay | Admitting: Obstetrics and Gynecology

## 2023-05-05 VITALS — BP 118/72 | HR 76

## 2023-05-05 DIAGNOSIS — N393 Stress incontinence (female) (male): Secondary | ICD-10-CM

## 2023-05-05 DIAGNOSIS — N816 Rectocele: Secondary | ICD-10-CM

## 2023-05-05 DIAGNOSIS — N993 Prolapse of vaginal vault after hysterectomy: Secondary | ICD-10-CM

## 2023-05-05 NOTE — Patient Instructions (Signed)
Use the estrogen cream x2 weekly.   Call us and let us know if you are uncomfortable and need it out before the follow up.
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
## 2023-06-01 ENCOUNTER — Ambulatory Visit: Payer: Medicare HMO | Admitting: Obstetrics and Gynecology

## 2023-06-18 ENCOUNTER — Ambulatory Visit: Payer: Medicare HMO | Admitting: Obstetrics and Gynecology

## 2023-06-18 ENCOUNTER — Encounter: Payer: Self-pay | Admitting: Obstetrics and Gynecology

## 2023-06-18 VITALS — BP 123/75 | HR 73

## 2023-06-18 DIAGNOSIS — N812 Incomplete uterovaginal prolapse: Secondary | ICD-10-CM

## 2023-06-18 DIAGNOSIS — N993 Prolapse of vaginal vault after hysterectomy: Secondary | ICD-10-CM

## 2023-06-18 DIAGNOSIS — N952 Postmenopausal atrophic vaginitis: Secondary | ICD-10-CM

## 2023-06-18 DIAGNOSIS — N816 Rectocele: Secondary | ICD-10-CM
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
## 2023-07-15 ENCOUNTER — Telehealth: Payer: Self-pay

## 2023-07-15 NOTE — Telephone Encounter (Signed)
   Pre-operative Risk Assessment    Patient Name: Deborah [name]  DOB: 12/20/1950 MRN: [id number]      Request for Surgical Clearance    Procedure:   left total knee arthroplasty  Date of Surgery:  Clearance TBD                                 Surgeon:  Dr. Weber Cooks Surgeon's Group or Practice Name:  [facility] Phone number:  [phone] Fax number:  [phone]   Type of Clearance Requested:   - Medical  - Pharmacy:  Hold Aspirin and Clopidogrel (Plavix) Please advise   Type of Anesthesia:  Spinal   Additional requests/questions:   Merlene Laughter   07/15/2023, 1:55 PM

## 2023-07-15 NOTE — Telephone Encounter (Signed)
   Name: Deborah [name]  DOB: [birth year]  MRN: [id number]  Primary Cardiologist: Norman Herrlich, MD   Preoperative team, please contact this patient and set up a phone call appointment for further preoperative risk assessment. Please obtain consent and complete medication review. Thank you for your help.  I confirm that guidance regarding antiplatelet and oral anticoagulation therapy has been completed and, if necessary, noted below.  Per office protocol, if patient is without any new symptoms or concerns at the time of their virtual visit, he/she may hold Plavix for 5 days and ASA for 7 days prior to procedure. Please resume ASA and Plavix as soon as possible postprocedure, at the discretion of the surgeon.    I also confirmed the patient resides in the state of [place]. As per [facility] Medical Board telemedicine laws, the patient must reside in the state in which the provider is licensed.   Joni Reining, NP 07/15/2023, 3:16 PM [facility]

## 2023-07-16 NOTE — Telephone Encounter (Signed)
Left message to call back to schedule tele pre op appt.  

## 2023-07-17 ENCOUNTER — Telehealth: Payer: Self-pay | Admitting: Cardiology

## 2023-07-17 ENCOUNTER — Telehealth: Payer: Self-pay

## 2023-07-17 NOTE — Telephone Encounter (Signed)
Spoke with patient who is agreeable to do a tele visit on 12/4 at 1:40 pm. Med rec and consent done.

## 2023-07-17 NOTE — Telephone Encounter (Signed)
Pt is returning a call for pre-op

## 2023-07-17 NOTE — Telephone Encounter (Signed)
See clearance phone note

## 2023-07-17 NOTE — Telephone Encounter (Signed)
  Patient Consent for Virtual Visit        Deborah [name] has provided verbal consent on 07/17/2023 for a virtual visit (video or telephone).   CONSENT FOR VIRTUAL VISIT FOR:  Deborah [name]  By participating in this virtual visit I agree to the following:  I hereby voluntarily request, consent and authorize [facility] and its employed or contracted physicians, physician assistants, nurse practitioners or other licensed health care professionals (the Practitioner), to provide me with telemedicine health care services (the "Services") as deemed necessary by the treating Practitioner. I acknowledge and consent to receive the Services by the Practitioner via telemedicine. I understand that the telemedicine visit will involve communicating with the Practitioner through live audiovisual communication technology and the disclosure of certain medical information by electronic transmission. I acknowledge that I have been given the opportunity to request an in-person assessment or other available alternative prior to the telemedicine visit and am voluntarily participating in the telemedicine visit.  I understand that I have the right to withhold or withdraw my consent to the use of telemedicine in the course of my care at any time, without affecting my right to future care or treatment, and that the Practitioner or I may terminate the telemedicine visit at any time. I understand that I have the right to inspect all information obtained and/or recorded in the course of the telemedicine visit and may receive copies of available information for a reasonable fee.  I understand that some of the potential risks of receiving the Services via telemedicine include:  Delay or interruption in medical evaluation due to technological equipment failure or disruption; Information transmitted may not be sufficient (e.g. poor resolution of images) to allow for appropriate medical decision making by the  Practitioner; and/or  In rare instances, security protocols could fail, causing a breach of personal health information.  Furthermore, I acknowledge that it is my responsibility to provide information about my medical history, conditions and care that is complete and accurate to the best of my ability. I acknowledge that Practitioner's advice, recommendations, and/or decision may be based on factors not within their control, such as incomplete or inaccurate data provided by me or distortions of diagnostic images or specimens that may result from electronic transmissions. I understand that the practice of medicine is not an exact science and that Practitioner makes no warranties or guarantees regarding treatment outcomes. I acknowledge that a copy of this consent can be made available to me via my patient portal [facility] MyChart), or I can request a printed copy by calling the office of [facility].    I understand that my insurance will be billed for this visit.   I have read or had this consent read to me. I understand the contents of this consent, which adequately explains the benefits and risks of the Services being provided via telemedicine.  I have been provided ample opportunity to ask questions regarding this consent and the Services and have had my questions answered to my satisfaction. I give my informed consent for the services to be provided through the use of telemedicine in my medical care

## 2023-08-05 ENCOUNTER — Ambulatory Visit: Payer: Medicare HMO | Attending: Cardiovascular Disease

## 2023-08-05 DIAGNOSIS — Z01810 Encounter for preprocedural cardiovascular examination: Secondary | ICD-10-CM | POA: Diagnosis not present
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
## 2023-08-24 ENCOUNTER — Ambulatory Visit: Payer: Medicare HMO

## 2023-09-18 LAB — LAB REPORT - SCANNED
Albumin, Urine POC: 0.9
Creatinine, POC: 88 mg/dL
EGFR: 92
Free T4: 1.8 ng/dL
Microalb Creat Ratio: 13
TSH: 2.24 (ref 0.41–5.90)

## 2023-09-21 ENCOUNTER — Other Ambulatory Visit: Payer: Self-pay

## 2023-09-22 ENCOUNTER — Ambulatory Visit: Payer: Medicare HMO

## 2023-09-22 VITALS — BP 130/72 | HR 74 | Ht 62.0 in | Wt 139.0 lb

## 2023-09-22 DIAGNOSIS — Z01810 Encounter for preprocedural cardiovascular examination: Secondary | ICD-10-CM | POA: Insufficient documentation

## 2023-09-22 DIAGNOSIS — I2510 Atherosclerotic heart disease of native coronary artery without angina pectoris: Secondary | ICD-10-CM | POA: Diagnosis not present

## 2023-09-22 DIAGNOSIS — E782 Mixed hyperlipidemia: Secondary | ICD-10-CM | POA: Diagnosis not present

## 2023-09-22 MED ORDER — NITROGLYCERIN 0.4 MG SL SUBL
0.4000 mg | SUBLINGUAL_TABLET | SUBLINGUAL | 11 refills | Status: AC | PRN
Start: 2023-09-22 — End: ?

## 2023-09-22 NOTE — Assessment & Plan Note (Signed)
Last lipid panel to review is from 09-25-2021 total cholesterol 187, HDL 65, LDL 108, triglycerides 79. She is only on rosuvastatin 10 mg every other day and that is the maximum dose she is able to tolerate. At this time we will hold off on further escalation of therapy given her relative intolerance to various medications, borderline well-controlled LDL levels on current regimen.  She is pending follow-up visit with PCP and will review annual lipid panels when available.

## 2023-09-22 NOTE — Assessment & Plan Note (Addendum)
 No active cardiac symptoms at this time. No angina, no heart failure signs or symptoms. Has significant coronary artery disease history as evident in last CAD workup was at the time of unstable angina and cath.  She underwent Cutting Balloon angioplasty of LAD in-stent restenosis.  I do not have any recent echocardiogram to assess cardiac structure or function.  Will obtain a transthoracic echocardiogram for baseline cardiac structure and function.  If stable cardiac structure and function without any significant valvular abnormalities or LV dysfunction, it is reasonable to proceed with her elective noncardiac surgery for left total knee arthroplasty as being planned.  Given her overall underlying CAD history she remains at elevated risk for perioperative cardiac complications.  Continue clopidogrel  75 mg once a day uninterrupted perioperatively given her cardiovascular risk.  She is currently taking aspirin  once a week.  This can be interrupted as needed for surgery.  Addendum 02/19/2024: Other option is to perioperatively continue aspirin  81 mg once daily. - Hold Plavix  [clopidogrel ] 7 days prior to the procedure. After the surgery transition back to Plavix .

## 2023-09-22 NOTE — Patient Instructions (Signed)
Medication Instructions:  Your physician recommends that you continue on your current medications as directed. Please refer to the Current Medication list given to you today.  *If you need a refill on your cardiac medications before your next appointment, please call your pharmacy*   Lab Work: None Ordered If you have labs (blood work) drawn today and your tests are completely normal, you will receive your results only by: MyChart Message (if you have MyChart) OR A paper copy in the mail If you have any lab test that is abnormal or we need to change your treatment, we will call you to review the results.   Testing/Procedures: Echocardiogram An echocardiogram is a test that uses sound waves (ultrasound) to produce images of the heart. Images from an echocardiogram can provide important information about: Heart size and shape. The size and thickness and movement of your heart's walls. Heart muscle function and strength. Heart valve function or if you have stenosis. Stenosis is when the heart valves are too narrow. If blood is flowing backward through the heart valves (regurgitation). A tumor or infectious growth around the heart valves. Areas of heart muscle that are not working well because of poor blood flow or injury from a heart attack. Aneurysm detection. An aneurysm is a weak or damaged part of an artery wall. The wall bulges out from the normal force of blood pumping through the body. Tell a health care provider about: Any allergies you have. All medicines you are taking, including vitamins, herbs, eye drops, creams, and over-the-counter medicines. Any blood disorders you have. Any surgeries you have had. Any medical conditions you have. Whether you are pregnant or may be pregnant. What are the risks? Generally, this is a safe test. However, problems may occur, including an allergic reaction to dye (contrast) that may be used during the test. What happens before the test? No  specific preparation is needed. You may eat and drink normally. What happens during the test?  You will take off your clothes from the waist up and put on a hospital gown. Electrodes or electrocardiogram (ECG)patches may be placed on your chest. The electrodes or patches are then connected to a device that monitors your heart rate and rhythm. You will lie down on a table for an ultrasound exam. A gel will be applied to your chest to help sound waves pass through your skin. A handheld device, called a transducer, will be pressed against your chest and moved over your heart. The transducer produces sound waves that travel to your heart and bounce back (or "echo" back) to the transducer. These sound waves will be captured in real-time and changed into images of your heart that can be viewed on a video monitor. The images will be recorded on a computer and reviewed by your health care provider. You may be asked to change positions or hold your breath for a short time. This makes it easier to get different views or better views of your heart. In some cases, you may receive contrast through an IV in one of your veins. This can improve the quality of the pictures from your heart. The procedure may vary among health care providers and hospitals. What can I expect after the test? You may return to your normal, everyday life, including diet, activities, and medicines, unless your health care provider tells you not to do that. Follow these instructions at home: It is up to you to get the results of your test. Ask your health care provider,  or the department that is doing the test, when your results will be ready. Keep all follow-up visits. This is important. Summary An echocardiogram is a test that uses sound waves (ultrasound) to produce images of the heart. Images from an echocardiogram can provide important information about the size and shape of your heart, heart muscle function, heart valve function, and  other possible heart problems. You do not need to do anything to prepare before this test. You may eat and drink normally. After the echocardiogram is completed, you may return to your normal, everyday life, unless your health care provider tells you not to do that. This information is not intended to replace advice given to you by your health care provider. Make sure you discuss any questions you have with your health care provider. Document Revised: 05/01/2021 Document Reviewed: 04/10/2020 Elsevier Patient Education  2023 Elsevier Inc.        Follow-Up: At [facility], you and your health needs are our priority.  As part of our continuing mission to provide you with exceptional heart care, we have created designated Provider Care Teams.  These Care Teams include your primary Cardiologist (physician) and Advanced Practice Providers (APPs -  Physician Assistants and Nurse Practitioners) who all work together to provide you with the care you need, when you need it.  We recommend signing up for the patient portal called "MyChart".  Sign up information is provided on this After Visit Summary.  MyChart is used to connect with patients for Virtual Visits (Telemedicine).  Patients are able to view lab/test results, encounter notes, upcoming appointments, etc.  Non-urgent messages can be sent to your provider as well.   To learn more about what you can do with MyChart, go to [email or web address].    Your next appointment:   1 year

## 2023-09-22 NOTE — Assessment & Plan Note (Signed)
Remains asymptomatic at this time. Continue with clopidogrel 75 mg once daily. Continue this uninterrupted perioperatively
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
## 2023-09-23 ENCOUNTER — Ambulatory Visit: Payer: Medicare HMO | Admitting: Obstetrics and Gynecology

## 2023-09-28 ENCOUNTER — Other Ambulatory Visit: Payer: Self-pay

## 2023-09-28 DIAGNOSIS — Z01810 Encounter for preprocedural cardiovascular examination: Secondary | ICD-10-CM

## 2023-09-28 DIAGNOSIS — I2510 Atherosclerotic heart disease of native coronary artery without angina pectoris: Secondary | ICD-10-CM

## 2023-09-28 DIAGNOSIS — E782 Mixed hyperlipidemia: Secondary | ICD-10-CM

## 2023-09-29 ENCOUNTER — Encounter: Payer: Self-pay | Admitting: Obstetrics and Gynecology

## 2023-09-29 ENCOUNTER — Ambulatory Visit: Payer: Medicare HMO | Admitting: Obstetrics and Gynecology

## 2023-09-29 ENCOUNTER — Other Ambulatory Visit (HOSPITAL_COMMUNITY)
Admission: RE | Admit: 2023-09-29 | Discharge: 2023-09-29 | Disposition: A | Discharge status: Home / Self Care | Payer: Medicare HMO | Source: Other Acute Inpatient Hospital | Attending: Obstetrics and Gynecology | Admitting: Obstetrics and Gynecology

## 2023-09-29 VITALS — BP 116/71 | HR 81

## 2023-09-29 DIAGNOSIS — R82998 Other abnormal findings in urine: Secondary | ICD-10-CM | POA: Insufficient documentation

## 2023-09-29 DIAGNOSIS — N390 Urinary tract infection, site not specified: Secondary | ICD-10-CM

## 2023-09-29 DIAGNOSIS — R350 Frequency of micturition: Secondary | ICD-10-CM

## 2023-09-29 DIAGNOSIS — R319 Hematuria, unspecified: Secondary | ICD-10-CM

## 2023-09-29 DIAGNOSIS — N812 Incomplete uterovaginal prolapse: Secondary | ICD-10-CM | POA: Diagnosis not present

## 2023-09-29 LAB — POCT URINALYSIS DIPSTICK
Bilirubin, UA: NEGATIVE
Glucose, UA: NEGATIVE
Ketones, UA: NEGATIVE
Nitrite, UA: NEGATIVE
Protein, UA: NEGATIVE
Spec Grav, UA: 1.01 (ref 1.010–1.025)
Urobilinogen, UA: 0.2 U/dL
pH, UA: 6 (ref 5.0–8.0)

## 2023-09-29 LAB — URINALYSIS, COMPLETE (UACMP) WITH MICROSCOPIC
Bilirubin Urine: NEGATIVE
Glucose, UA: NEGATIVE mg/dL
Ketones, ur: NEGATIVE mg/dL
Nitrite: NEGATIVE
Protein, ur: NEGATIVE mg/dL
Specific Gravity, Urine: 1.012 (ref 1.005–1.030)
WBC, UA: >50 WBC/hpf (ref 0–5)
pH: 6 (ref 5.0–8.0)

## 2023-09-29 MED ORDER — SULFAMETHOXAZOLE-TRIMETHOPRIM 800-160 MG PO TABS: 1.0000 | ORAL_TABLET | Freq: Two times a day (BID) | ORAL | 0 refills | Status: DC

## 2023-09-29 MED ORDER — PHENAZOPYRIDINE HCL 200 MG PO TABS: 200.0000 mg | ORAL_TABLET | Freq: Three times a day (TID) | ORAL | 0 refills | Status: AC | PRN

## 2023-09-29 MED ORDER — PHENAZOPYRIDINE HCL 200 MG PO TABS: 200.0000 mg | ORAL_TABLET | Freq: Three times a day (TID) | ORAL | 0 refills | Status: DC | PRN

## 2023-09-29 MED ORDER — SULFAMETHOXAZOLE-TRIMETHOPRIM 800-160 MG PO TABS: 1.0000 | ORAL_TABLET | Freq: Two times a day (BID) | ORAL | 0 refills | Status: AC

## 2023-09-29 NOTE — Patient Instructions (Addendum)
You have signs of a UTI.   Start antibiotic for UTI.   Use estrogen for the vaginal atrophy.
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
## 2023-10-01 ENCOUNTER — Other Ambulatory Visit: Payer: Self-pay | Admitting: Obstetrics and Gynecology

## 2023-10-01 DIAGNOSIS — A029 Salmonella infection, unspecified: Secondary | ICD-10-CM

## 2023-10-01 MED ORDER — LEVOFLOXACIN 500 MG PO TABS: 500.0000 mg | ORAL_TABLET | Freq: Every day | ORAL | 0 refills | Status: DC

## 2023-10-01 MED ORDER — LEVOFLOXACIN 500 MG PO TABS: 500.0000 mg | ORAL_TABLET | Freq: Every day | ORAL | 0 refills | Status: AC

## 2023-10-01 NOTE — Progress Notes (Signed)
Called and spoke to patient. Patient prescribed Levofloxacin for 10 days per the up to date recommendations. Encouraged her to take this with food and drink.

## 2023-10-04 ENCOUNTER — Other Ambulatory Visit: Payer: Self-pay | Admitting: Cardiology

## 2023-10-04 DIAGNOSIS — I25118 Atherosclerotic heart disease of native coronary artery with other forms of angina pectoris: Secondary | ICD-10-CM

## 2023-10-15 LAB — URINE CULTURE: Culture: 10000 — AB

## 2023-10-22 ENCOUNTER — Ambulatory Visit (HOSPITAL_BASED_OUTPATIENT_CLINIC_OR_DEPARTMENT_OTHER)
Admission: RE | Admit: 2023-10-22 | Discharge: 2023-10-22 | Disposition: A | Discharge status: Home / Self Care | Payer: Medicare HMO | Source: Ambulatory Visit

## 2023-10-22 DIAGNOSIS — I2510 Atherosclerotic heart disease of native coronary artery without angina pectoris: Secondary | ICD-10-CM | POA: Diagnosis present

## 2023-10-22 DIAGNOSIS — Z01810 Encounter for preprocedural cardiovascular examination: Secondary | ICD-10-CM | POA: Diagnosis present

## 2023-10-22 LAB — ECHOCARDIOGRAM COMPLETE
AR max vel: 1.53 cm2
AV Area VTI: 1.64 cm2
AV Area mean vel: 1.59 cm2
AV Mean grad: 6 mm[Hg]
AV Peak grad: 8.8 mm[Hg]
AV Vena cont: 0.3 cm
Ao pk vel: 1.48 m/s
Area-P 1/2: 3.68 cm2
Calc EF: 64.4 %
MV M vel: 4.76 m/s
MV Peak grad: 90.6 mm[Hg]
P 1/2 time: 897 ms
S' Lateral: 2.7 cm
Single Plane A2C EF: 60.1 %
Single Plane A4C EF: 66.5 %

## 2023-11-24 NOTE — Progress Notes (Signed)
 Surgery orders requested via Epic inbox.

## 2023-12-01 ENCOUNTER — Encounter (HOSPITAL_COMMUNITY)
Admission: RE | Admit: 2023-12-01 | Discharge: 2023-12-01 | Disposition: A | Discharge status: Home / Self Care | Payer: Medicare HMO | Source: Ambulatory Visit | Attending: Internal Medicine | Admitting: Internal Medicine

## 2023-12-14 ENCOUNTER — Ambulatory Visit (HOSPITAL_COMMUNITY): Admit: 2023-12-14 | Payer: Medicare HMO | Admitting: Orthopedic Surgery

## 2023-12-14 SURGERY — ARTHROPLASTY, KNEE, TOTAL
Anesthesia: Spinal | Site: Knee | Laterality: Left

## 2023-12-29 ENCOUNTER — Encounter: Payer: Self-pay | Admitting: Obstetrics and Gynecology

## 2023-12-29 ENCOUNTER — Ambulatory Visit: Payer: Medicare HMO | Admitting: Obstetrics and Gynecology

## 2023-12-29 VITALS — BP 107/61 | HR 77

## 2023-12-29 DIAGNOSIS — R8290 Unspecified abnormal findings in urine: Secondary | ICD-10-CM

## 2023-12-29 DIAGNOSIS — N812 Incomplete uterovaginal prolapse: Secondary | ICD-10-CM | POA: Diagnosis not present

## 2023-12-29 DIAGNOSIS — N816 Rectocele: Secondary | ICD-10-CM

## 2023-12-29 DIAGNOSIS — R350 Frequency of micturition: Secondary | ICD-10-CM

## 2023-12-29 DIAGNOSIS — N993 Prolapse of vaginal vault after hysterectomy: Secondary | ICD-10-CM

## 2023-12-29 LAB — POCT URINALYSIS DIPSTICK
Bilirubin, UA: NEGATIVE
Blood, UA: NEGATIVE
Glucose, UA: NEGATIVE
Leukocytes, UA: NEGATIVE
Nitrite, UA: NEGATIVE
Protein, UA: NEGATIVE
Spec Grav, UA: 1.02 (ref 1.010–1.025)
Urobilinogen, UA: 0.2 U/dL
pH, UA: 5.5 (ref 5.0–8.0)
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
## 2023-12-29 NOTE — Patient Instructions (Signed)
 Se use the estrogen cream the next 3 nights and then go back down to 2 times a week.   We will call you with your culture results

## 2024-02-03 ENCOUNTER — Telehealth: Payer: Self-pay

## 2024-02-03 NOTE — Telephone Encounter (Signed)
 Deborah [name] calls today with complaints of high blood pressure, headaches and bladder spasms.  Advised her to call her primary care provider for BP and headache caused by her blood pressure. She is convinced something is wrong with her bladder.denies any UTI symptoms or signs, just that she thinks she has bladder spasms. Pt is urinating and doesn't have any concerns emptying her bladder. Patient advised to come in to see a provider as this sounds more than just a UTI nurse visit. Scheduled her an appointment for 02-08-2024.

## 2024-02-04 ENCOUNTER — Ambulatory Visit (INDEPENDENT_AMBULATORY_CARE_PROVIDER_SITE_OTHER)

## 2024-02-04 ENCOUNTER — Other Ambulatory Visit (HOSPITAL_COMMUNITY)
Admission: RE | Admit: 2024-02-04 | Discharge: 2024-02-04 | Disposition: A | Discharge status: Home / Self Care | Source: Ambulatory Visit | Attending: Obstetrics and Gynecology | Admitting: Obstetrics and Gynecology

## 2024-02-04 VITALS — BP 133/71 | HR 80 | Temp 97.1°F

## 2024-02-04 DIAGNOSIS — R82998 Other abnormal findings in urine: Secondary | ICD-10-CM | POA: Insufficient documentation

## 2024-02-04 LAB — POCT URINALYSIS DIP (CLINITEK)
Bilirubin, UA: NEGATIVE
Blood, UA: NEGATIVE
Glucose, UA: NEGATIVE mg/dL
Ketones, POC UA: NEGATIVE mg/dL
Nitrite, UA: POSITIVE — AB
POC PROTEIN,UA: NEGATIVE
Spec Grav, UA: <=1.005 — AB (ref 1.010–1.025)
Urobilinogen, UA: 0.2 U/dL
pH, UA: 6 (ref 5.0–8.0)

## 2024-02-04 MED ORDER — SULFAMETHOXAZOLE-TRIMETHOPRIM 800-160 MG PO TABS: 1.0000 | ORAL_TABLET | Freq: Two times a day (BID) | ORAL | 0 refills | Status: DC

## 2024-02-04 NOTE — Progress Notes (Signed)
 Deborah [name] arrived today with lower abdominal pain. Patient is notexperiencing fever, unstable vitals and/or one-sided back flank pain. Patient has not had had a recent hospitalization due to UTI.  Last visit in the office was 02/03/2024.  Per protocol:   The most recent Urinalysis completed on 12-29-2023 and wasnormal.  Last Creatinine level  Lab Results  Component Value Date   CREATININE 0.73 12/29/2021    An urine specimen was collected and POCT urinalysis completed. [] A cath specimen was collected due to patient's current condition, symptoms or post-procedural state.  Total urine output by catheter is  Output by Drain (mL) 02/02/24 0701 - 02/02/24 1900 02/02/24 1901 - 02/03/24 0700 02/03/24 0701 - 02/03/24 1900 02/03/24 1901 - 02/04/24 0700 02/04/24 0701 - 02/04/24 1159  Patient has no LDAs of requested type attached.    [name]    POCT Urine results is not normal.  Urine micro was not sent per protocol for abnormal urinalysis.  Urine culture was sent per protocol for abnormal urinalysis.     [x] Pt was notified of positive urine results and plan for additional urine testing. We will contact you within the next 3-4 days with these results.  [] No Prescription was sent to your pharmacy.  The additional testing will indicate if a prescription is needed.   [x] Patient was notified of abnormal urine results. The following prescription is sent to your preferred pharmacy.  []  Macrobid 100mg  #10 1 tablet by mouth twice daily with food for 5 days      []  Bactrim  DS 800-160mg  #6 1 tablet by mouth twice daily for 3 days        []  Due to your current medication allergies, an alternate prescription was discussed with your provider and will be prescribed and sent to your pharmacy.  [x] You can take over the counter AZO two tablets up to three times a day for two days.  Take AZO tablets with a full glass of water . AZO will turn your urine orange, this is normal.   [] The patient was notified of negative urine  results.  If symptoms persist, you may take over the counter AZO two tablets up to three times a day for two days.  AZO will turn your urine orange, this is normal.  Contact the office back to schedule an appointment if your symptoms persist or worsen or you develop additional symptoms.       CC'd note to patient's provider.

## 2024-02-05 ENCOUNTER — Other Ambulatory Visit: Payer: Self-pay

## 2024-02-05 ENCOUNTER — Encounter (HOSPITAL_BASED_OUTPATIENT_CLINIC_OR_DEPARTMENT_OTHER): Payer: Self-pay

## 2024-02-05 ENCOUNTER — Emergency Department (HOSPITAL_BASED_OUTPATIENT_CLINIC_OR_DEPARTMENT_OTHER)

## 2024-02-05 ENCOUNTER — Ambulatory Visit: Payer: Self-pay | Admitting: Obstetrics and Gynecology

## 2024-02-05 ENCOUNTER — Emergency Department (HOSPITAL_BASED_OUTPATIENT_CLINIC_OR_DEPARTMENT_OTHER)
Admission: EM | Admit: 2024-02-05 | Discharge: 2024-02-05 | Disposition: A | Discharge status: Home / Self Care | Attending: Emergency Medicine | Admitting: Emergency Medicine

## 2024-02-05 DIAGNOSIS — S72115A Nondisplaced fracture of greater trochanter of left femur, initial encounter for closed fracture: Secondary | ICD-10-CM | POA: Diagnosis not present

## 2024-02-05 DIAGNOSIS — W010XXA Fall on same level from slipping, tripping and stumbling without subsequent striking against object, initial encounter: Secondary | ICD-10-CM | POA: Diagnosis not present

## 2024-02-05 DIAGNOSIS — Y92002 Bathroom of unspecified non-institutional (private) residence single-family (private) house as the place of occurrence of the external cause: Secondary | ICD-10-CM | POA: Insufficient documentation

## 2024-02-05 DIAGNOSIS — Z7902 Long term (current) use of antithrombotics/antiplatelets: Secondary | ICD-10-CM | POA: Diagnosis not present

## 2024-02-05 DIAGNOSIS — Z7982 Long term (current) use of aspirin: Secondary | ICD-10-CM | POA: Diagnosis not present

## 2024-02-05 DIAGNOSIS — S79912A Unspecified injury of left hip, initial encounter: Secondary | ICD-10-CM | POA: Diagnosis present

## 2024-02-05 LAB — URINE CULTURE: Culture: <10000 — AB

## 2024-02-05 NOTE — Discharge Instructions (Signed)
 You can put weight on your leg as tolerated.  Use a walker for support.  Follow-up with your orthopedist on Monday.  Return to the emergency room if you have any worsening symptoms.

## 2024-02-05 NOTE — ED Provider Notes (Signed)
 [facility] Provider Note   CSN: [id number] Arrival date & time: 02/05/24  1927     History  Chief Complaint  Patient presents with   Hip Pain    Deborah [name] is a 73 y.o. female.  Patient is a 73 year old female who presents with pain in her left hip.  She states she fell about 10 days ago and has had some pain in the outer aspect of her left hip.  It has been hurting when she turns.  It has been hurting a little bit worse over the last couple days.  This morning she turned to open the dishwasher and felt an increase in her pain.  She has had a hard time bearing weight since that time.  She denies any other injuries from the fall.  She said she fell because she tripped going over the doorway into her bathroom.  She denies any head injury.  No neck or back pain.  She is on Plavix  and baby aspirin .  She went to [facility] urgent care today and reportedly the x-rays looked normal and was told to come to the ED to get an MRI.       Home Medications Prior to Admission medications   Medication Sig Start Date End Date Taking? Authorizing Provider  acetaminophen  (TYLENOL ) 325 MG tablet Take 1-2 tablets (325-650 mg total) by mouth every 4 (four) hours as needed for mild pain. 01/03/22   Love, Renay Carota, PA-C  amphetamine -dextroamphetamine  (ADDERALL  XR) 30 MG 24 hr capsule Take 30 mg by mouth daily. 07/18/23   [provider]  aspirin  81 MG EC tablet Take 1 tablet (81 mg total) by mouth daily. For at least 30 days to prevent blood clots. Patient taking differently: Take 81 mg by mouth once a week. For at least 30 days to prevent blood clots. 01/03/22   Love, Renay Carota, PA-C  Calcium  Carb-Cholecalciferol  (CALCIUM  + D3 PO) Take 1 tablet by mouth 2 (two) times daily.    [provider]  carvedilol  (COREG ) 3.125 MG tablet Take 1 tablet (3.125 mg total) by mouth 2 (two) times daily with a meal. 10/05/23   Madireddy, Daymon Evans, MD   cetirizine (ZYRTEC) 10 MG tablet Take 10 mg by mouth daily as needed for allergies.    [provider]  clobetasol cream (TEMOVATE) 0.05 % Apply 1 application. topically daily as needed (dry skin). 05/04/20   [provider]  clopidogrel  (PLAVIX ) 75 MG tablet TAKE 1 TABLET BY MOUTH DAILY WITH BREAKFAST 03/27/23   Hassan Links, MD  diphenhydrAMINE  (BENADRYL ) 25 MG tablet Take 12.5 mg by mouth 3 (three) times daily as needed for itching or allergies.    [provider]  estradiol  (ESTRACE ) 0.1 MG/GM vaginal cream Place 0.5 g vaginally 2 (two) times a week. Place 0.5g nightly for two weeks then twice a week after 04/06/23   Zuleta, Kaitlin G, NP  estradiol  (ESTRACE ) 0.5 MG tablet Take 0.5 mg by mouth daily. 08/20/22   [provider]  fluticasone  (FLONASE ) 50 MCG/ACT nasal spray Place 2 sprays into both nostrils as needed for allergies or rhinitis.    [provider]  gabapentin  (NEURONTIN ) 300 MG capsule Take 600 mg by mouth 4 (four) times daily as needed (pain). 08/20/22   [provider]  guaiFENesin  (MUCINEX ) 600 MG 12 hr tablet Take 600 mg by mouth 2 (two) times daily as needed for to loosen phlegm.    [provider]  HYDROcodone -acetaminophen  (NORCO) 10-325 MG tablet Take 1 tablet by mouth every 6 (six) hours as needed for moderate pain.    [provider]  levothyroxine  (SYNTHROID , LEVOTHROID) 75 MCG tablet Take 75 mcg by mouth daily before breakfast.    [provider]  Magnesium  Oxide -Mg Supplement 500 MG TABS Take 500 mg by mouth daily.    [provider]  methocarbamol  (ROBAXIN ) 750 MG tablet Take 375 mg by mouth 3 (three) times daily.    [provider]  Multiple Vitamins-Minerals (ONE-A-DAY WOMENS 50+ ADVANTAGE) TABS Take 1 tablet by mouth daily.     [provider]  nitroGLYCERIN  (NITROSTAT ) 0.4 MG SL tablet Place 1 tablet (0.4 mg total) under the tongue every 5 (five) minutes as  needed for chest pain. 09/22/23   Madireddy, Daymon Evans, MD  phenazopyridine  (PYRIDIUM ) 200 MG tablet Take 1 tablet (200 mg total) by mouth 3 (three) times daily as needed for pain. 09/29/23   Zuleta, Kaitlin G, NP  rosuvastatin  (CRESTOR ) 10 MG tablet Take 10 mg by mouth every other day.    [provider]  senna (SENOKOT) 8.6 MG TABS tablet Take 1 tablet (8.6 mg total) by mouth at bedtime. Patient taking differently: Take 2 tablets by mouth at bedtime. 01/03/22   Love, Renay Carota, PA-C  sertraline  (ZOLOFT ) 100 MG tablet Take 100 mg by mouth 2 (two) times daily.    [provider]  sulfamethoxazole -trimethoprim  (BACTRIM  DS) 800-160 MG tablet Take 1 tablet by mouth 2 (two) times daily. 02/04/24   Zuleta, Kaitlin G, NP  TURMERIC PO Take 1 capsule by mouth daily.    [provider]  valACYclovir  (VALTREX ) 1000 MG tablet Take 1,000 mg by mouth daily. 05/23/20   [provider]      Allergies    Altace [ramipril], Chocolate, Meloxicam, Demerol [meperidine], and Ibuprofen    Review of Systems   Review of Systems  Constitutional:  Negative for fever.  Gastrointestinal:  Negative for nausea and vomiting.  Musculoskeletal:  Positive for arthralgias. Negative for back pain, joint swelling and neck pain.  Skin:  Negative for wound.  Neurological:  Negative for weakness, numbness and headaches.    Physical Exam Updated Vital Signs BP (!) 141/71   Pulse 71   Temp 97.6 F (36.4 C) (Oral)   Resp 16   Ht 5\' 2"  (1.575 m)   Wt 63.5 kg   SpO2 100%   BMI 25.61 kg/m  Physical Exam Constitutional:      Appearance: She is well-developed.  HENT:     Head: Normocephalic and atraumatic.  Cardiovascular:     Rate and Rhythm: Normal rate.  Pulmonary:     Effort: Pulmonary effort is normal.  Musculoskeletal:        General: Tenderness present.     Cervical back: Normal range of motion and neck supple.     Comments: Positive area of ecchymosis to the lateral aspect of her  left hip.  There is tenderness around this area.  There is not too much tenderness on rotation of the hip joint but there is tenderness on flexion of the hip joint.  There is a little bit of pain in the posterior hip but no pain along the lower spine.  No pain to the knee or ankle.  Pedal pulses are intact.  She has normal sensation and motor function distally.  Skin:    General: Skin is warm and dry.  Neurological:  Mental Status: She is alert and oriented to person, place, and time.     ED Results / Procedures / Treatments   Labs (all labs ordered are listed, but only abnormal results are displayed) Labs Reviewed - No data to display  EKG None  Radiology CT Hip Left Wo Contrast Result Date: 02/05/2024 CLINICAL DATA:  Hip trauma.  Concern for fracture. EXAM: CT OF THE LEFT HIP WITHOUT CONTRAST TECHNIQUE: Multidetector CT imaging of the left hip was performed according to the standard protocol. Multiplanar CT image reconstructions were also generated. RADIATION DOSE REDUCTION: This exam was performed according to the departmental dose-optimization program which includes automated exposure control, adjustment of the mA and/or kV according to patient size and/or use of iterative reconstruction technique. COMPARISON:  None Available. FINDINGS: Bones/Joint/Cartilage There is a nondisplaced, somewhat comminuted fracture of the greater trochanter of the left femur with extension of the fracture to the intertrochanteric ridge. No other acute fracture. Evaluation for fracture however is limited due to osteopenia. There is no dislocation. There is mild to moderate arthritic changes of the left hip. No joint effusion. Ligaments Suboptimally assessed by CT. Muscles and Tendons No acute findings. Soft tissues No acute findings.  A pessary is noted in the pelvis. IMPRESSION: Nondisplaced fracture of the greater trochanter of the left femur. Electronically Signed   By: Angus Bark M.D.   On: 02/05/2024  21:45    Procedures Procedures    Medications Ordered in ED Medications - No data to display  ED Course/ Medical Decision Making/ A&P                                 Medical Decision Making Amount and/or Complexity of Data Reviewed Radiology: ordered.   Patient is a 73 year old who presents with pain in her left hip.  No significant swelling or deformity noted to the leg.  CT scan was performed which shows a fracture through the greater trochanter of the hip.  There is some extension into the intertrochanteric area.  Discussed findings with Dr. Curtiss Dowdy who is on-call for her orthopedic group.  He has reviewed the films and says that this is nonoperative.  He recommends weightbearing as tolerated.  Discussed this with the patient.  Discussed whether she would be okay going home or whether we should admit her for pain management.  She feels comfortable going home.  She says she has pain medications to use at home.  She has hydrocodone  and gabapentin .  She has a walker to use.  Her daughters come stay with her over the weekend.  They will follow-up with her orthopedist on Monday.  She was discharged home in good condition.  Return precautions were given.  Final Clinical Impression(s) / ED Diagnoses Final diagnoses:  Closed nondisplaced fracture of greater trochanter of left femur, initial encounter [facility])    Rx / DC Orders ED Discharge Orders     None         Hershel Los, MD 02/05/24 2243

## 2024-02-05 NOTE — ED Notes (Signed)
 ED Provider at bedside.

## 2024-02-05 NOTE — ED Notes (Signed)
 Pt. Was sent here by her ortho Dr. For an MRI how ever we do not have MRI at the ED today.

## 2024-02-05 NOTE — ED Triage Notes (Signed)
 Pt fell about 10 days ago and injured her left hip. She pivoted this am and has not been able to bear weight since.

## 2024-02-08 ENCOUNTER — Ambulatory Visit: Admitting: Obstetrics and Gynecology

## 2024-02-11 ENCOUNTER — Telehealth: Payer: Self-pay

## 2024-02-11 NOTE — Telephone Encounter (Signed)
   Pre-operative Risk Assessment    Patient Name: Deborah [name]  DOB: 1950-10-22 MRN: [id number]   Date of last office visit: 09/22/23 Date of next office visit: N/A   Request for Surgical Clearance    Procedure:  left total knee arthroplasty  Date of Surgery:  Clearance TBD                                Surgeon:  Dr. Priscille Brought Surgeon's Group or Practice Name:  [facility]  Phone number:  [phone]  Fax number:  [phone]   Type of Clearance Requested:   - Medical    Type of Anesthesia:  Spinal   Additional requests/questions:    SignedBascom Lily   02/11/2024, 8:21 AM

## 2024-02-11 NOTE — Telephone Encounter (Signed)
 med rec and consent done     Patient Consent for Virtual Visit        Deborah [name] has provided verbal consent on 02/11/2024 for a virtual visit (video or telephone).   CONSENT FOR VIRTUAL VISIT FOR:  Deborah [name]  By participating in this virtual visit I agree to the following:  I hereby voluntarily request, consent and authorize [facility] and its employed or contracted physicians, physician assistants, nurse practitioners or other licensed health care professionals (the Practitioner), to provide me with telemedicine health care services (the "Services) as deemed necessary by the treating Practitioner. I acknowledge and consent to receive the Services by the Practitioner via telemedicine. I understand that the telemedicine visit will involve communicating with the Practitioner through live audiovisual communication technology and the disclosure of certain medical information by electronic transmission. I acknowledge that I have been given the opportunity to request an in-person assessment or other available alternative prior to the telemedicine visit and am voluntarily participating in the telemedicine visit.  I understand that I have the right to withhold or withdraw my consent to the use of telemedicine in the course of my care at any time, without affecting my right to future care or treatment, and that the Practitioner or I may terminate the telemedicine visit at any time. I understand that I have the right to inspect all information obtained and/or recorded in the course of the telemedicine visit and may receive copies of available information for a reasonable fee.  I understand that some of the potential risks of receiving the Services via telemedicine include:  Delay or interruption in medical evaluation due to technological equipment failure or disruption; Information transmitted may not be sufficient (e.g. poor resolution of images) to allow for appropriate medical  decision making by the Practitioner; and/or  In rare instances, security protocols could fail, causing a breach of personal health information.  Furthermore, I acknowledge that it is my responsibility to provide information about my medical history, conditions and care that is complete and accurate to the best of my ability. I acknowledge that Practitioner's advice, recommendations, and/or decision may be based on factors not within their control, such as incomplete or inaccurate data provided by me or distortions of diagnostic images or specimens that may result from electronic transmissions. I understand that the practice of medicine is not an exact science and that Practitioner makes no warranties or guarantees regarding treatment outcomes. I acknowledge that a copy of this consent can be made available to me via my patient portal [facility] MyChart), or I can request a printed copy by calling the office of [facility].    I understand that my insurance will be billed for this visit.   I have read or had this consent read to me. I understand the contents of this consent, which adequately explains the benefits and risks of the Services being provided via telemedicine.  I have been provided ample opportunity to ask questions regarding this consent and the Services and have had my questions answered to my satisfaction. I give my informed consent for the services to be provided through the use of telemedicine in my medical care

## 2024-02-11 NOTE — Telephone Encounter (Signed)
 S/W pt and scheduled TELE Preop appt 02/16/24. med rec and consent done

## 2024-02-11 NOTE — Telephone Encounter (Signed)
 Primary Cardiologist:Brian Sandee Crook, MD   Preoperative team, please contact this patient and set up a phone call appointment for further preoperative risk assessment. Please obtain consent and complete medication review. Thank you for your help. (Previous clearance given > 2 months ago).   I confirm that guidance regarding antiplatelet and oral anticoagulation therapy has been completed and, if necessary, noted below (none requested).  I also confirmed the patient resides in the state of [place] . As per [facility] Board telemedicine laws, the patient must reside in the state in which the provider is licensed.   Gerldine Koch, NP-C  02/11/2024, 9:32 AM [address], [address] [place], [place] [address] Office [phone] Fax [phone]

## 2024-02-16 ENCOUNTER — Ambulatory Visit: Attending: Cardiology | Admitting: Student

## 2024-02-16 DIAGNOSIS — Z01810 Encounter for preprocedural cardiovascular examination: Secondary | ICD-10-CM | POA: Diagnosis not present
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
## 2024-02-17 ENCOUNTER — Ambulatory Visit: Admitting: Obstetrics and Gynecology

## 2024-02-17 VITALS — BP 122/72 | HR 75

## 2024-02-17 DIAGNOSIS — N3289 Other specified disorders of bladder: Secondary | ICD-10-CM | POA: Diagnosis not present
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
## 2024-02-17 NOTE — Patient Instructions (Signed)
 Take the Pyridium  if you have bladder spasms.   If this is not helping please call and we can do a bladder instillation   After your knee replacement we will plan for you to do water  therapy or pelvic floor PT to work on your core-strength and balance related to the pelvic floor trauma you have sustained with the last few falls  Please be careful

## 2024-03-24 NOTE — Progress Notes (Signed)
 Sent message, via epic in basket, requesting orders in epic from Care[facility] adviser.

## 2024-03-29 ENCOUNTER — Ambulatory Visit: Payer: Self-pay | Admitting: Emergency Medicine

## 2024-03-29 ENCOUNTER — Encounter: Payer: Self-pay | Admitting: Obstetrics and Gynecology

## 2024-03-29 ENCOUNTER — Ambulatory Visit: Admitting: Obstetrics and Gynecology

## 2024-03-29 VITALS — BP 99/67 | HR 83

## 2024-03-29 DIAGNOSIS — N952 Postmenopausal atrophic vaginitis: Secondary | ICD-10-CM

## 2024-03-29 DIAGNOSIS — G8929 Other chronic pain: Secondary | ICD-10-CM

## 2024-03-29 DIAGNOSIS — N816 Rectocele: Secondary | ICD-10-CM

## 2024-03-29 DIAGNOSIS — N993 Prolapse of vaginal vault after hysterectomy: Secondary | ICD-10-CM

## 2024-03-29 DIAGNOSIS — N812 Incomplete uterovaginal prolapse: Secondary | ICD-10-CM
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
## 2024-03-29 NOTE — Progress Notes (Signed)
Second request for pre op orders: Left voicemail for [employer]

## 2024-03-29 NOTE — Progress Notes (Signed)
 COVID Vaccine received:  []  No [x]  Yes Date of any COVID positive Test in last 90 days: no PCP - Camellia Hutchinson MD Cardiologist - Alean Madireddy MD  Chest x-ray -  EKG -  09/22/23 Epic Stress Test -  ECHO - 10/22/23 Epic Cardiac Cath - 06/01/17 Epic  Cardiac clearance-02/16/24- Barnie Hila NP  Bowel Prep - [x]  No  []   Yes ______  Pacemaker / ICD device [x]  No []  Yes   Spinal Cord Stimulator:[x]  No []  Yes       History of Sleep Apnea? [x]  No []  Yes   CPAP used?- [x]  No []  Yes    Does the patient monitor blood sugar?          [x]  No []  Yes  []  N/A  Patient has: [x]  NO Hx DM   []  Pre-DM                 []  DM1  []   DM2 Does patient have a [employer] or Dexacom? []  No []  Yes   Fasting Blood Sugar Ranges-  Checks Blood Sugar _____ times a day  GLP1 agonist / usual dose - no GLP1 instructions:  SGLT-2 inhibitors / usual dose - no SGLT-2 instructions:   Blood Thinner / Instructions:Plavix  -will call Office for instructions of when to hold. Aspirin  Instructions:ASA once a week  Comments:   Activity level: Patient is able to climb a flight of stairs without difficulty; [x]  No CP  []  No SOB,   Patient can perform ADLs without assistance.   Anesthesia review: CAD, MI-stent 2006, HTN, on Plavix   Patient denies shortness of breath, fever, cough and chest pain at PAT appointment.  Patient verbalized understanding and agreement to the Pre-Surgical Instructions that were given to them at this PAT appointment. Patient was also educated of the need to review these PAT instructions again prior to his/her surgery.I reviewed the appropriate phone numbers to call if they have any and questions or concerns.
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
## 2024-03-29 NOTE — Patient Instructions (Signed)
 Continue your estrogen cream x2 weekly.

## 2024-03-29 NOTE — Progress Notes (Signed)
 Request sent to Dr. Edna to send pre op orders for PST visit 03/31/24.

## 2024-03-29 NOTE — Patient Instructions (Signed)
 SURGICAL WAITING ROOM VISITATION  Patients having surgery or a procedure may have no more than 2 support people in the waiting area - these visitors may rotate.    Children under the age of 72 must have an adult with them who is not the patient.  Visitors with respiratory illnesses are discouraged from visiting and should remain at home.  If the patient needs to stay at the hospital during part of their recovery, the visitor guidelines for inpatient rooms apply. Pre-op nurse will coordinate an appropriate time for 1 support person to accompany patient in pre-op.  This support person may not rotate.    Please refer to the [facility] website for the visitor guidelines for Inpatients (after your surgery is over and you are in a regular room).       Your procedure is scheduled on: 04/11/24   Report to [facility] Main Entrance    Report to admitting at  5:15 AM   Call this number if you have problems the morning of surgery [phone]   Do not eat food :After Midnight.   After Midnight you may have the following liquids until ______ AM/ PM DAY OF SURGERY  Water  Non-Citrus Juices (without pulp, NO RED-Apple, White grape, White cranberry) Black Coffee (NO MILK/CREAM OR CREAMERS, sugar ok)  Clear Tea (NO MILK/CREAM OR CREAMERS, sugar ok) regular and decaf                             Plain Jell-O (NO RED)                                           Fruit ices (not with fruit pulp, NO RED)                                     Popsicles (NO RED)                                                               Sports drinks like Gatorade (NO RED)              Drink 2 Ensure/G2 drinks AT 10:00 PM the night before surgery.        The day of surgery:  Drink ONE (1) Pre-Surgery Clear Ensure at AM the morning of surgery. Drink in one sitting. Do not sip.  This drink was given to you during your hospital  pre-op appointment visit. Nothing else to drink after completing the  Pre-Surgery  Clear Ensure    Oral Hygiene is also important to reduce your risk of infection.                                    Remember - BRUSH YOUR TEETH THE MORNING OF SURGERY WITH YOUR REGULAR TOOTHPASTE  DENTURES WILL BE REMOVED PRIOR TO SURGERY PLEASE DO NOT APPLY Poly grip OR ADHESIVES!!!   Stop all vitamins and herbal supplements 7 days before surgery.   Take these  medicines the morning of surgery with A SIP OF WATER : carvedilol . Cetirizine(zyrtec), estradiol ,gabapentin , levothyroxine , rosuvastatin , sertraline (zoloft ), Tylenol  if needed, nasal spray.             You may not have any metal on your body including hair pins, jewelry, and body piercing             Do not wear make-up, lotions, powders, perfumes/cologne, or deodorant  Do not wear nail polish including gel and S&S, artificial/acrylic nails, or any other type of covering on natural nails including finger and toenails. If you have artificial nails, gel coating, etc. that needs to be removed by a nail salon please have this removed prior to surgery or surgery may need to be canceled/ delayed if the surgeon/ anesthesia feels like they are unable to be safely monitored.   Do not shave  48 hours prior to surgery.    Do not bring valuables to the hospital. [facility] IS NOT             RESPONSIBLE   FOR VALUABLES.   Contacts, glasses, dentures or bridgework may not be worn into surgery.  DO NOT BRING YOUR HOME MEDICATIONS TO THE HOSPITAL. PHARMACY WILL DISPENSE MEDICATIONS LISTED ON YOUR MEDICATION LIST TO YOU DURING YOUR ADMISSION IN THE HOSPITAL!    Patients discharged on the day of surgery will not be allowed to drive home.  Someone NEEDS to stay with you for the first 24 hours after anesthesia.   Special Instructions: Bring a copy of your healthcare power of attorney and living will documents the day of surgery if you haven't scanned them before.              Please read over the following fact sheets you were given: IF YOU HAVE  QUESTIONS ABOUT YOUR PRE-OP INSTRUCTIONS PLEASE CALL [phone] [facility]   If you received a COVID test during your pre-op visit  it is requested that you wear a mask when out in public, stay away from anyone that may not be feeling well and notify your surgeon if you develop symptoms. If you test positive for Covid or have been in contact with anyone that has tested positive in the last 10 days please notify you surgeon.      Pre-operative 5 CHG Bath Instructions   You can play a key role in reducing the risk of infection after surgery. Your skin needs to be as free of germs as possible. You can reduce the number of germs on your skin by washing with CHG (chlorhexidine  gluconate) soap before surgery. CHG is an antiseptic soap that kills germs and continues to kill germs even after washing.   DO NOT use if you have an allergy to chlorhexidine /CHG or antibacterial soaps. If your skin becomes reddened or irritated, stop using the CHG and notify one of our RNs at [phone].   Please shower with the CHG soap starting 4 days before surgery using the following schedule:     Please keep in mind the following:  DO NOT shave, including legs and underarms, starting the day of your first shower.   You may shave your face at any point before/day of surgery.  Place clean sheets on your bed the day you start using CHG soap. Use a clean washcloth (not used since being washed) for each shower. DO NOT sleep with pets once you start using the CHG.   CHG Shower Instructions:  If you choose to wash your hair and private area, wash  first with your normal shampoo/soap.  After you use shampoo/soap, rinse your hair and body thoroughly to remove shampoo/soap residue.  Turn the water  OFF and apply about 3 tablespoons (45 ml) of CHG soap to a CLEAN washcloth.  Apply CHG soap ONLY FROM YOUR NECK DOWN TO YOUR TOES (washing for 3-5 minutes)  DO NOT use CHG soap on face, private areas, open wounds, or sores.  Pay  special attention to the area where your surgery is being performed.  If you are having back surgery, having someone wash your back for you may be helpful. Wait 2 minutes after CHG soap is applied, then you may rinse off the CHG soap.  Pat dry with a clean towel  Put on clean clothes/pajamas   If you choose to wear lotion, please use ONLY the CHG-compatible lotions on the back of this paper.     Additional instructions for the day of surgery: DO NOT APPLY any lotions, deodorants, cologne, or perfumes.   Put on clean/comfortable clothes.  Brush your teeth.  Ask your nurse before applying any prescription medications to the skin.      CHG Compatible Lotions   Aveeno Moisturizing lotion  Cetaphil Moisturizing Cream  Cetaphil Moisturizing Lotion  Clairol Herbal Essence Moisturizing Lotion, Dry Skin  Clairol Herbal Essence Moisturizing Lotion, Extra Dry Skin  Clairol Herbal Essence Moisturizing Lotion, Normal Skin  Curel Age Defying Therapeutic Moisturizing Lotion with Alpha Hydroxy  Curel Extreme Care Body Lotion  Curel Soothing Hands Moisturizing Hand Lotion  Curel Therapeutic Moisturizing Cream, Fragrance-Free  Curel Therapeutic Moisturizing Lotion, Fragrance-Free  Curel Therapeutic Moisturizing Lotion, Original Formula  Eucerin Daily Replenishing Lotion  Eucerin Dry Skin Therapy Plus Alpha Hydroxy Crme  Eucerin Dry Skin Therapy Plus Alpha Hydroxy Lotion  Eucerin Original Crme  Eucerin Original Lotion  Eucerin Plus Crme Eucerin Plus Lotion  Eucerin TriLipid Replenishing Lotion  Keri Anti-Bacterial Hand Lotion  Keri Deep Conditioning Original Lotion Dry Skin Formula Softly Scented  Keri Deep Conditioning Original Lotion, Fragrance Free Sensitive Skin Formula  Keri Lotion Fast Absorbing Fragrance Free Sensitive Skin Formula  Keri Lotion Fast Absorbing Softly Scented Dry Skin Formula  Keri Original Lotion  Keri Skin Renewal Lotion Keri Silky Smooth Lotion  Keri Silky Smooth  Sensitive Skin Lotion  Nivea Body Creamy Conditioning Oil  Nivea Body Extra Enriched Teacher, adult education Moisturizing Lotion Nivea Crme  Nivea Skin Firming Lotion  NutraDerm 30 Skin Lotion  NutraDerm Skin Lotion  NutraDerm Therapeutic Skin Cream  NutraDerm Therapeutic Skin Lotion  ProShield Protective Hand Cream  Provon moisturizing lotion

## 2024-03-31 ENCOUNTER — Encounter (HOSPITAL_COMMUNITY)
Admission: RE | Admit: 2024-03-31 | Discharge: 2024-03-31 | Disposition: A | Discharge status: Home / Self Care | Source: Ambulatory Visit | Attending: Orthopedic Surgery | Admitting: Orthopedic Surgery

## 2024-03-31 ENCOUNTER — Other Ambulatory Visit: Payer: Self-pay

## 2024-03-31 ENCOUNTER — Encounter (HOSPITAL_COMMUNITY): Payer: Self-pay

## 2024-03-31 VITALS — BP 128/82 | HR 82 | Temp 97.7°F | Resp 16 | Ht 62.0 in | Wt 138.0 lb

## 2024-03-31 DIAGNOSIS — J449 Chronic obstructive pulmonary disease, unspecified: Secondary | ICD-10-CM | POA: Insufficient documentation

## 2024-03-31 DIAGNOSIS — M25562 Pain in left knee: Secondary | ICD-10-CM | POA: Diagnosis not present

## 2024-03-31 DIAGNOSIS — I10 Essential (primary) hypertension: Secondary | ICD-10-CM | POA: Insufficient documentation

## 2024-03-31 DIAGNOSIS — G8929 Other chronic pain: Secondary | ICD-10-CM | POA: Diagnosis not present

## 2024-03-31 DIAGNOSIS — Z01818 Encounter for other preprocedural examination: Secondary | ICD-10-CM

## 2024-03-31 DIAGNOSIS — M1712 Unilateral primary osteoarthritis, left knee: Secondary | ICD-10-CM | POA: Diagnosis not present

## 2024-03-31 DIAGNOSIS — I2510 Atherosclerotic heart disease of native coronary artery without angina pectoris: Secondary | ICD-10-CM | POA: Diagnosis not present

## 2024-03-31 DIAGNOSIS — Z01812 Encounter for preprocedural laboratory examination: Secondary | ICD-10-CM | POA: Diagnosis present

## 2024-03-31 HISTORY — DX: Unspecified osteoarthritis, unspecified site: M19.90

## 2024-03-31 LAB — CBC WITH DIFFERENTIAL/PLATELET
Abs Immature Granulocytes: 0.01 K/uL (ref 0.00–0.07)
Basophils Absolute: 0.1 K/uL (ref 0.0–0.1)
Basophils Relative: 1 %
Eosinophils Absolute: 0.3 K/uL (ref 0.0–0.5)
Eosinophils Relative: 7 %
HCT: 39.1 % (ref 36.0–46.0)
Hemoglobin: 12.6 g/dL (ref 12.0–15.0)
Immature Granulocytes: 0 %
Lymphocytes Relative: 30 %
Lymphs Abs: 1.4 K/uL (ref 0.7–4.0)
MCH: 32.8 pg (ref 26.0–34.0)
MCHC: 32.2 g/dL (ref 30.0–36.0)
MCV: 101.8 fL — ABNORMAL HIGH (ref 80.0–100.0)
Monocytes Absolute: 0.5 K/uL (ref 0.1–1.0)
Monocytes Relative: 12 %
Neutro Abs: 2.3 K/uL (ref 1.7–7.7)
Neutrophils Relative %: 50 %
Platelets: 197 K/uL (ref 150–400)
RBC: 3.84 MIL/uL — ABNORMAL LOW (ref 3.87–5.11)
RDW: 12.3 % (ref 11.5–15.5)
WBC: 4.6 K/uL (ref 4.0–10.5)
nRBC: 0 % (ref 0.0–0.2)

## 2024-03-31 LAB — COMPREHENSIVE METABOLIC PANEL WITH GFR
ALT: 20 U/L (ref 0–44)
AST: 25 U/L (ref 15–41)
Albumin: 3.8 g/dL (ref 3.5–5.0)
Alkaline Phosphatase: 90 U/L (ref 38–126)
Anion gap: 9 (ref 5–15)
BUN: 16 mg/dL (ref 8–23)
CO2: 24 mmol/L (ref 22–32)
Calcium: 9.1 mg/dL (ref 8.9–10.3)
Chloride: 104 mmol/L (ref 98–111)
Creatinine, Ser: 0.74 mg/dL (ref 0.44–1.00)
GFR, Estimated: >60 mL/min (ref >60)
Glucose, Bld: 98 mg/dL (ref 70–99)
Potassium: 4.1 mmol/L (ref 3.5–5.1)
Sodium: 137 mmol/L (ref 135–145)
Total Bilirubin: 0.8 mg/dL (ref 0.0–1.2)
Total Protein: 7 g/dL (ref 6.5–8.1)

## 2024-03-31 LAB — SURGICAL PCR SCREEN
MRSA, PCR: NEGATIVE
Staphylococcus aureus: NEGATIVE

## 2024-03-31 LAB — TYPE AND SCREEN
ABO/RH(D): B POS
Antibody Screen: NEGATIVE
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
## 2024-04-05 NOTE — Anesthesia Preprocedure Evaluation (Addendum)
 Anesthesia Evaluation  Patient identified by MRN, date of birth, ID band Patient awake    Reviewed: Allergy & Precautions, H&P , NPO status , Patient's Chart, lab work & pertinent test results  Airway Mallampati: I  TM Distance: >3 FB Neck ROM: Full    Dental no notable dental hx. (+) Teeth Intact, Dental Advisory Given   Pulmonary COPD   Pulmonary exam normal breath sounds clear to auscultation       Cardiovascular Exercise Tolerance: Good hypertension, Pt. on medications and Pt. on home beta blockers + CAD, + Past MI and + Cardiac Stents   Rhythm:Regular Rate:Normal     Neuro/Psych     Bipolar Disorder   negative neurological ROS     GI/Hepatic Neg liver ROS,GERD  Medicated,,  Endo/Other  Hypothyroidism    Renal/GU negative Renal ROS  negative genitourinary   Musculoskeletal  (+) Arthritis , Osteoarthritis,    Abdominal   Peds  Hematology negative hematology ROS (+)   Anesthesia Other Findings   Reproductive/Obstetrics negative OB ROS                              Anesthesia Physical Anesthesia Plan  ASA: 3  Anesthesia Plan: General   Post-op Pain Management: Regional block* and Tylenol  PO (pre-op)*   Induction: Intravenous  PONV Risk Score and Plan: 3 and 4 or greater and Ondansetron , Dexamethasone , Treatment may vary due to age or medical condition, Propofol  infusion and TIVA  Airway Management Planned: LMA  Additional Equipment:   Intra-op Plan:   Post-operative Plan: Extubation in OR  Informed Consent: I have reviewed the patients History and Physical, chart, labs and discussed the procedure including the risks, benefits and alternatives for the proposed anesthesia with the patient or authorized representative who has indicated his/her understanding and acceptance.     Dental advisory given  Plan Discussed with: CRNA  Anesthesia Plan Comments: (See PAT note  03/31/2024)         Anesthesia Quick Evaluation

## 2024-04-12 NOTE — Progress Notes (Signed)
 Attempted to obtain medical history via telephone, unable to reach at this time. HIPAA compliant voicemail message left requesting return call to pre surgical testing department.

## 2024-04-13 NOTE — Progress Notes (Addendum)
 Updated date of surgery:  04/18/24  Updated time of arrival: 615 AM  Last dose of Plavix  04/12/2024, will remain on Aspirin   Patient will be discharged from hospital and monitored at home for 24 hours by: daughter Deborah [name]  Patient denies any changes in allergies, medications, medical history since pre op appointment on: 03/31/24  Pre op instructions reviewed, follow up questions addressed and patient verbalized understanding at this time.

## 2024-04-13 NOTE — Progress Notes (Signed)
 Attempted to obtain medical history via telephone, unable to reach at this time. HIPAA compliant voicemail message left requesting return call to pre surgical testing department.

## 2024-04-18 ENCOUNTER — Other Ambulatory Visit: Payer: Self-pay

## 2024-04-18 ENCOUNTER — Observation Stay (HOSPITAL_COMMUNITY)

## 2024-04-18 ENCOUNTER — Ambulatory Visit (HOSPITAL_BASED_OUTPATIENT_CLINIC_OR_DEPARTMENT_OTHER): Payer: Self-pay | Admitting: Anesthesiology

## 2024-04-18 ENCOUNTER — Encounter (HOSPITAL_COMMUNITY)
Admission: RE | Disposition: A | Discharge status: Home / Self Care | Payer: Self-pay | Source: Home / Self Care | Attending: Orthopedic Surgery

## 2024-04-18 ENCOUNTER — Observation Stay (HOSPITAL_COMMUNITY)
Admission: RE | Admit: 2024-04-18 | Discharge: 2024-04-19 | Disposition: A | Discharge status: Home / Self Care | Attending: Orthopedic Surgery | Admitting: Orthopedic Surgery

## 2024-04-18 ENCOUNTER — Encounter (HOSPITAL_COMMUNITY): Payer: Self-pay | Admitting: Orthopedic Surgery

## 2024-04-18 ENCOUNTER — Ambulatory Visit (HOSPITAL_COMMUNITY): Payer: Self-pay | Admitting: Medical

## 2024-04-18 DIAGNOSIS — M1712 Unilateral primary osteoarthritis, left knee: Principal | ICD-10-CM | POA: Diagnosis present

## 2024-04-18 DIAGNOSIS — I2510 Atherosclerotic heart disease of native coronary artery without angina pectoris: Secondary | ICD-10-CM | POA: Insufficient documentation

## 2024-04-18 DIAGNOSIS — I10 Essential (primary) hypertension: Secondary | ICD-10-CM

## 2024-04-18 DIAGNOSIS — E039 Hypothyroidism, unspecified: Secondary | ICD-10-CM | POA: Insufficient documentation

## 2024-04-18 DIAGNOSIS — Z7982 Long term (current) use of aspirin: Secondary | ICD-10-CM | POA: Diagnosis not present

## 2024-04-18 DIAGNOSIS — J449 Chronic obstructive pulmonary disease, unspecified: Secondary | ICD-10-CM | POA: Insufficient documentation

## 2024-04-18 DIAGNOSIS — Z01818 Encounter for other preprocedural examination: Principal | ICD-10-CM

## 2024-04-18 HISTORY — PX: TOTAL KNEE ARTHROPLASTY: SHX125

## 2024-04-18 LAB — CBC
HCT: 36.8 % (ref 36.0–46.0)
Hemoglobin: 11.7 g/dL — ABNORMAL LOW (ref 12.0–15.0)
MCH: 32.2 pg (ref 26.0–34.0)
MCHC: 31.8 g/dL (ref 30.0–36.0)
MCV: 101.4 fL — ABNORMAL HIGH (ref 80.0–100.0)
Platelets: 170 K/uL (ref 150–400)
RBC: 3.63 MIL/uL — ABNORMAL LOW (ref 3.87–5.11)
RDW: 12.4 % (ref 11.5–15.5)
WBC: 5.2 K/uL (ref 4.0–10.5)
nRBC: 0 % (ref 0.0–0.2)

## 2024-04-18 LAB — BASIC METABOLIC PANEL WITH GFR
Anion gap: 7 (ref 5–15)
BUN: 17 mg/dL (ref 8–23)
CO2: 25 mmol/L (ref 22–32)
Calcium: 8.6 mg/dL — ABNORMAL LOW (ref 8.9–10.3)
Chloride: 105 mmol/L (ref 98–111)
Creatinine, Ser: 0.61 mg/dL (ref 0.44–1.00)
GFR, Estimated: >60 mL/min (ref >60)
Glucose, Bld: 102 mg/dL — ABNORMAL HIGH (ref 70–99)
Potassium: 3.9 mmol/L (ref 3.5–5.1)
Sodium: 137 mmol/L (ref 135–145)

## 2024-04-18 LAB — TYPE AND SCREEN
ABO/RH(D): B POS
Antibody Screen: NEGATIVE

## 2024-04-18 SURGERY — ARTHROPLASTY, KNEE, TOTAL
Anesthesia: General | Site: Knee | Laterality: Left

## 2024-04-18 MED ORDER — OXYCODONE HCL 5 MG PO TABS
10.0000 mg | ORAL_TABLET | ORAL | Status: DC | PRN
  Administered 2024-04-18 – 2024-04-19 (×3): 10 mg via ORAL
  Filled 2024-04-18 (×3): qty 2

## 2024-04-18 MED ORDER — PROPOFOL 10 MG/ML IV BOLUS
INTRAVENOUS | Status: DC | PRN
  Administered 2024-04-18: 200 mg via INTRAVENOUS
  Administered 2024-04-18: 100 ug/kg/min via INTRAVENOUS

## 2024-04-18 MED ORDER — LACTATED RINGERS IV SOLN: INTRAVENOUS | Status: DC

## 2024-04-18 MED ORDER — PROPOFOL 1000 MG/100ML IV EMUL
INTRAVENOUS | Status: AC
  Filled 2024-04-18: qty 100

## 2024-04-18 MED ORDER — PHENYLEPHRINE HCL-NACL 20-0.9 MG/250ML-% IV SOLN
INTRAVENOUS | Status: AC
  Filled 2024-04-18: qty 250

## 2024-04-18 MED ORDER — PROPOFOL 10 MG/ML IV BOLUS
INTRAVENOUS | Status: AC
  Filled 2024-04-18: qty 20

## 2024-04-18 MED ORDER — SODIUM CHLORIDE (PF) 0.9 % IJ SOLN
INTRAMUSCULAR | Status: AC
  Filled 2024-04-18: qty 30

## 2024-04-18 MED ORDER — ACETAMINOPHEN 500 MG PO TABS
1000.0000 mg | ORAL_TABLET | Freq: Four times a day (QID) | ORAL | Status: DC
  Administered 2024-04-18 – 2024-04-19 (×3): 1000 mg via ORAL
  Filled 2024-04-18 (×3): qty 2

## 2024-04-18 MED ORDER — ONDANSETRON HCL 4 MG/2ML IJ SOLN: 4.0000 mg | Freq: Four times a day (QID) | INTRAMUSCULAR | Status: DC | PRN

## 2024-04-18 MED ORDER — ACETAMINOPHEN 500 MG PO TABS: 1000.0000 mg | ORAL_TABLET | Freq: Once | ORAL | Status: DC

## 2024-04-18 MED ORDER — CEFAZOLIN SODIUM-DEXTROSE 2-4 GM/100ML-% IV SOLN
2.0000 g | Freq: Four times a day (QID) | INTRAVENOUS | Status: AC
  Administered 2024-04-18 (×2): 2 g via INTRAVENOUS
  Filled 2024-04-18 (×2): qty 100

## 2024-04-18 MED ORDER — BUPIVACAINE-EPINEPHRINE (PF) 0.5% -1:200000 IJ SOLN
INTRAMUSCULAR | Status: DC | PRN
  Administered 2024-04-18: 20 mL via PERINEURAL

## 2024-04-18 MED ORDER — FENTANYL CITRATE PF 50 MCG/ML IJ SOSY
50.0000 ug | PREFILLED_SYRINGE | INTRAMUSCULAR | Status: DC
  Administered 2024-04-18: 50 ug via INTRAVENOUS

## 2024-04-18 MED ORDER — BUPIVACAINE-EPINEPHRINE (PF) 0.25% -1:200000 IJ SOLN
INTRAMUSCULAR | Status: AC
  Filled 2024-04-18: qty 30

## 2024-04-18 MED ORDER — EPHEDRINE 5 MG/ML INJ
INTRAVENOUS | Status: AC
  Filled 2024-04-18: qty 5

## 2024-04-18 MED ORDER — DEXAMETHASONE SODIUM PHOSPHATE 10 MG/ML IJ SOLN
INTRAMUSCULAR | Status: AC
  Filled 2024-04-18: qty 1

## 2024-04-18 MED ORDER — FENTANYL CITRATE PF 50 MCG/ML IJ SOSY: 25.0000 ug | PREFILLED_SYRINGE | INTRAMUSCULAR | Status: DC | PRN

## 2024-04-18 MED ORDER — NITROGLYCERIN 0.4 MG SL SUBL: 0.4000 mg | SUBLINGUAL_TABLET | SUBLINGUAL | Status: DC | PRN

## 2024-04-18 MED ORDER — ROSUVASTATIN CALCIUM 10 MG PO TABS
10.0000 mg | ORAL_TABLET | ORAL | Status: DC
  Filled 2024-04-18: qty 1

## 2024-04-18 MED ORDER — ONDANSETRON HCL 4 MG PO TABS: 4.0000 mg | ORAL_TABLET | Freq: Three times a day (TID) | ORAL | 0 refills | Status: AC | PRN

## 2024-04-18 MED ORDER — ESTRADIOL 0.1 MG/GM VA CREA
0.5000 g | TOPICAL_CREAM | VAGINAL | Status: DC
  Filled 2024-04-18: qty 42.5

## 2024-04-18 MED ORDER — SUGAMMADEX SODIUM 200 MG/2ML IV SOLN
INTRAVENOUS | Status: DC | PRN
  Administered 2024-04-18 (×2): 100 mg via INTRAVENOUS

## 2024-04-18 MED ORDER — HYDROMORPHONE HCL 2 MG/ML IJ SOLN
INTRAMUSCULAR | Status: AC
Start: 2024-04-18 — End: 2024-04-18
  Filled 2024-04-18: qty 1

## 2024-04-18 MED ORDER — OMEPRAZOLE 40 MG PO CPDR: 40.0000 mg | DELAYED_RELEASE_CAPSULE | Freq: Every day | ORAL | 0 refills | Status: AC

## 2024-04-18 MED ORDER — MENTHOL 3 MG MT LOZG: 1.0000 | LOZENGE | OROMUCOSAL | Status: DC | PRN

## 2024-04-18 MED ORDER — ONDANSETRON HCL 4 MG PO TABS: 4.0000 mg | ORAL_TABLET | Freq: Four times a day (QID) | ORAL | Status: DC | PRN

## 2024-04-18 MED ORDER — ONDANSETRON HCL 4 MG/2ML IJ SOLN
INTRAMUSCULAR | Status: DC | PRN
  Administered 2024-04-18: 4 mg via INTRAVENOUS

## 2024-04-18 MED ORDER — ONDANSETRON HCL 4 MG/2ML IJ SOLN
INTRAMUSCULAR | Status: AC
  Filled 2024-04-18: qty 2

## 2024-04-18 MED ORDER — ACETAMINOPHEN 500 MG PO TABS
1000.0000 mg | ORAL_TABLET | Freq: Once | ORAL | Status: DC
  Filled 2024-04-18: qty 2

## 2024-04-18 MED ORDER — SERTRALINE HCL 100 MG PO TABS
100.0000 mg | ORAL_TABLET | Freq: Two times a day (BID) | ORAL | Status: DC
  Administered 2024-04-18 – 2024-04-19 (×2): 100 mg via ORAL
  Filled 2024-04-18 (×2): qty 1

## 2024-04-18 MED ORDER — ASPIRIN 81 MG PO TBEC: 81.0000 mg | DELAYED_RELEASE_TABLET | Freq: Two times a day (BID) | ORAL | Status: AC

## 2024-04-18 MED ORDER — BUPIVACAINE LIPOSOME 1.3 % IJ SUSP: 20.0000 mL | Freq: Once | INTRAMUSCULAR | Status: DC

## 2024-04-18 MED ORDER — GABAPENTIN 300 MG PO CAPS
300.0000 mg | ORAL_CAPSULE | Freq: Two times a day (BID) | ORAL | Status: DC | PRN
  Administered 2024-04-18 – 2024-04-19 (×2): 600 mg via ORAL
  Filled 2024-04-18 (×2): qty 2

## 2024-04-18 MED ORDER — CLOPIDOGREL BISULFATE 75 MG PO TABS: 75.0000 mg | ORAL_TABLET | Freq: Every day | ORAL | Status: DC

## 2024-04-18 MED ORDER — CEFAZOLIN SODIUM-DEXTROSE 2-4 GM/100ML-% IV SOLN
2.0000 g | INTRAVENOUS | Status: AC
  Administered 2024-04-18: 2 g via INTRAVENOUS
  Filled 2024-04-18: qty 100

## 2024-04-18 MED ORDER — FENTANYL CITRATE (PF) 100 MCG/2ML IJ SOLN
INTRAMUSCULAR | Status: AC
Start: 2024-04-18 — End: 2024-04-18
  Filled 2024-04-18: qty 2

## 2024-04-18 MED ORDER — DEXAMETHASONE SODIUM PHOSPHATE 10 MG/ML IJ SOLN
8.0000 mg | Freq: Once | INTRAMUSCULAR | Status: AC
  Administered 2024-04-18: 8 mg via INTRAVENOUS

## 2024-04-18 MED ORDER — NALOXONE HCL 0.4 MG/ML IJ SOLN
INTRAMUSCULAR | Status: DC | PRN
Start: 2024-04-18 — End: 2024-04-18
  Administered 2024-04-18: 8 ug via INTRAVENOUS

## 2024-04-18 MED ORDER — OXYCODONE HCL 5 MG PO TABS: 5.0000 mg | ORAL_TABLET | ORAL | 0 refills | Status: AC | PRN

## 2024-04-18 MED ORDER — ORAL CARE MOUTH RINSE: 15.0000 mL | Freq: Once | OROMUCOSAL | Status: AC

## 2024-04-18 MED ORDER — ASPIRIN 81 MG PO CHEW
81.0000 mg | CHEWABLE_TABLET | Freq: Two times a day (BID) | ORAL | Status: DC
  Administered 2024-04-18 – 2024-04-19 (×2): 81 mg via ORAL
  Filled 2024-04-18 (×2): qty 1

## 2024-04-18 MED ORDER — HYDROMORPHONE HCL 1 MG/ML IJ SOLN: 0.5000 mg | INTRAMUSCULAR | Status: DC | PRN

## 2024-04-18 MED ORDER — SUGAMMADEX SODIUM 200 MG/2ML IV SOLN
INTRAVENOUS | Status: AC
  Filled 2024-04-18: qty 2

## 2024-04-18 MED ORDER — HYDROMORPHONE HCL 1 MG/ML IJ SOLN
INTRAMUSCULAR | Status: DC | PRN
  Administered 2024-04-18 (×2): 1 mg via INTRAVENOUS

## 2024-04-18 MED ORDER — CHLORHEXIDINE GLUCONATE 0.12 % MT SOLN
15.0000 mL | Freq: Once | OROMUCOSAL | Status: AC
  Administered 2024-04-18: 15 mL via OROMUCOSAL

## 2024-04-18 MED ORDER — SODIUM CHLORIDE (PF) 0.9 % IJ SOLN
INTRAMUSCULAR | Status: DC | PRN
  Administered 2024-04-18: 80 mL

## 2024-04-18 MED ORDER — METHOCARBAMOL 750 MG PO TABS: 375.0000 mg | ORAL_TABLET | Freq: Four times a day (QID) | ORAL | 0 refills | Status: AC | PRN

## 2024-04-18 MED ORDER — METHOCARBAMOL 500 MG PO TABS
500.0000 mg | ORAL_TABLET | Freq: Four times a day (QID) | ORAL | Status: DC | PRN
  Administered 2024-04-18 (×2): 500 mg via ORAL
  Filled 2024-04-18 (×3): qty 1

## 2024-04-18 MED ORDER — WATER FOR IRRIGATION, STERILE IR SOLN
Status: DC | PRN
  Administered 2024-04-18: 2000 mL

## 2024-04-18 MED ORDER — ACETAMINOPHEN 325 MG PO TABS: 325.0000 mg | ORAL_TABLET | Freq: Four times a day (QID) | ORAL | Status: DC | PRN

## 2024-04-18 MED ORDER — ISOPROPYL ALCOHOL 70 % SOLN
Status: AC
  Filled 2024-04-18: qty 480

## 2024-04-18 MED ORDER — PHENAZOPYRIDINE HCL 200 MG PO TABS: 200.0000 mg | ORAL_TABLET | Freq: Three times a day (TID) | ORAL | Status: DC | PRN

## 2024-04-18 MED ORDER — TRANEXAMIC ACID-NACL 1000-0.7 MG/100ML-% IV SOLN
1000.0000 mg | INTRAVENOUS | Status: AC
  Administered 2024-04-18: 1000 mg via INTRAVENOUS
  Filled 2024-04-18: qty 100

## 2024-04-18 MED ORDER — DIPHENHYDRAMINE HCL 12.5 MG/5ML PO ELIX: 12.5000 mg | ORAL_SOLUTION | ORAL | Status: DC | PRN

## 2024-04-18 MED ORDER — CARVEDILOL 3.125 MG PO TABS
3.1250 mg | ORAL_TABLET | Freq: Two times a day (BID) | ORAL | Status: DC
  Administered 2024-04-18 – 2024-04-19 (×2): 3.125 mg via ORAL
  Filled 2024-04-18 (×2): qty 1

## 2024-04-18 MED ORDER — LEVOTHYROXINE SODIUM 75 MCG PO TABS
75.0000 ug | ORAL_TABLET | Freq: Every day | ORAL | Status: DC
  Administered 2024-04-19: 75 ug via ORAL
  Filled 2024-04-18: qty 1

## 2024-04-18 MED ORDER — PHENYLEPHRINE 80 MCG/ML (10ML) SYRINGE FOR IV PUSH (FOR BLOOD PRESSURE SUPPORT)
PREFILLED_SYRINGE | INTRAVENOUS | Status: AC
  Filled 2024-04-18: qty 10

## 2024-04-18 MED ORDER — PHENOL 1.4 % MT LIQD: 1.0000 | OROMUCOSAL | Status: DC | PRN

## 2024-04-18 MED ORDER — POLYETHYLENE GLYCOL 3350 17 G PO PACK: 17.0000 g | PACK | Freq: Every day | ORAL | 0 refills | Status: AC

## 2024-04-18 MED ORDER — ISOPROPYL ALCOHOL 70 % SOLN
Status: DC | PRN
  Administered 2024-04-18: 1 via TOPICAL

## 2024-04-18 MED ORDER — POVIDONE-IODINE 10 % EX SWAB: 2.0000 | Freq: Once | CUTANEOUS | Status: DC

## 2024-04-18 MED ORDER — 0.9 % SODIUM CHLORIDE (POUR BTL) OPTIME
TOPICAL | Status: DC | PRN
  Administered 2024-04-18: 1000 mL

## 2024-04-18 MED ORDER — PHENYLEPHRINE HCL-NACL 20-0.9 MG/250ML-% IV SOLN
INTRAVENOUS | Status: DC | PRN
Start: 2024-04-18 — End: 2024-04-18
  Administered 2024-04-18: 25 ug/min via INTRAVENOUS
  Administered 2024-04-18: 20 ug/min via INTRAVENOUS

## 2024-04-18 MED ORDER — ROCURONIUM BROMIDE 10 MG/ML (PF) SYRINGE
PREFILLED_SYRINGE | INTRAVENOUS | Status: DC | PRN
  Administered 2024-04-18: 40 mg via INTRAVENOUS

## 2024-04-18 MED ORDER — DOCUSATE SODIUM 100 MG PO CAPS
100.0000 mg | ORAL_CAPSULE | Freq: Two times a day (BID) | ORAL | Status: DC
  Administered 2024-04-18 – 2024-04-19 (×2): 100 mg via ORAL
  Filled 2024-04-18 (×2): qty 1

## 2024-04-18 MED ORDER — PANTOPRAZOLE SODIUM 40 MG PO TBEC
40.0000 mg | DELAYED_RELEASE_TABLET | Freq: Every day | ORAL | Status: DC
  Administered 2024-04-19: 40 mg via ORAL
  Filled 2024-04-18: qty 1

## 2024-04-18 MED ORDER — BUPIVACAINE LIPOSOME 1.3 % IJ SUSP
INTRAMUSCULAR | Status: AC
  Filled 2024-04-18: qty 20

## 2024-04-18 MED ORDER — LIDOCAINE HCL (PF) 2 % IJ SOLN
INTRAMUSCULAR | Status: AC
  Filled 2024-04-18: qty 5

## 2024-04-18 MED ORDER — NALOXONE HCL 0.4 MG/ML IJ SOLN
INTRAMUSCULAR | Status: AC
  Filled 2024-04-18: qty 1

## 2024-04-18 MED ORDER — SERTRALINE HCL 100 MG PO TABS: 100.0000 mg | ORAL_TABLET | Freq: Two times a day (BID) | ORAL | Status: DC

## 2024-04-18 MED ORDER — VALACYCLOVIR HCL 500 MG PO TABS
1000.0000 mg | ORAL_TABLET | Freq: Every day | ORAL | Status: DC
  Administered 2024-04-19: 1000 mg via ORAL
  Filled 2024-04-18: qty 2

## 2024-04-18 MED ORDER — SODIUM CHLORIDE 0.9 % IV SOLN: INTRAVENOUS | Status: DC

## 2024-04-18 MED ORDER — SODIUM CHLORIDE 0.9 % IR SOLN
Status: DC | PRN
Start: 2024-04-18 — End: 2024-04-18
  Administered 2024-04-18: 3000 mL

## 2024-04-18 MED ORDER — FLUTICASONE PROPIONATE 50 MCG/ACT NA SUSP
2.0000 | Freq: Every day | NASAL | Status: DC
  Filled 2024-04-18: qty 16

## 2024-04-18 MED ORDER — LORATADINE 10 MG PO TABS
10.0000 mg | ORAL_TABLET | Freq: Every day | ORAL | Status: DC
  Administered 2024-04-18: 10 mg via ORAL
  Filled 2024-04-18: qty 1

## 2024-04-18 MED ORDER — FENTANYL CITRATE PF 50 MCG/ML IJ SOSY
50.0000 ug | PREFILLED_SYRINGE | Freq: Once | INTRAMUSCULAR | Status: DC
  Filled 2024-04-18: qty 2

## 2024-04-18 MED ORDER — LORATADINE 10 MG PO TABS: 10.0000 mg | ORAL_TABLET | Freq: Every day | ORAL | Status: DC

## 2024-04-18 MED ORDER — MIDAZOLAM HCL 2 MG/2ML IJ SOLN
1.0000 mg | Freq: Once | INTRAMUSCULAR | Status: DC
  Filled 2024-04-18: qty 2

## 2024-04-18 MED ORDER — POLYETHYLENE GLYCOL 3350 17 G PO PACK
17.0000 g | PACK | Freq: Every day | ORAL | Status: DC | PRN
Start: 2024-04-18 — End: 2024-04-19

## 2024-04-18 MED ORDER — FENTANYL CITRATE (PF) 100 MCG/2ML IJ SOLN
INTRAMUSCULAR | Status: DC | PRN
  Administered 2024-04-18 (×2): 50 ug via INTRAVENOUS

## 2024-04-18 MED ORDER — METHOCARBAMOL 1000 MG/10ML IJ SOLN: 500.0000 mg | Freq: Four times a day (QID) | INTRAMUSCULAR | Status: DC | PRN

## 2024-04-18 MED ORDER — ZOLPIDEM TARTRATE 5 MG PO TABS: 5.0000 mg | ORAL_TABLET | Freq: Every evening | ORAL | Status: DC | PRN

## 2024-04-18 MED ORDER — AMPHETAMINE-DEXTROAMPHET ER 10 MG PO CP24
30.0000 mg | ORAL_CAPSULE | Freq: Every day | ORAL | Status: DC
  Administered 2024-04-19: 30 mg via ORAL
  Filled 2024-04-18: qty 3

## 2024-04-18 MED ORDER — LIDOCAINE HCL (PF) 2 % IJ SOLN
INTRAMUSCULAR | Status: DC | PRN
Start: 2024-04-18 — End: 2024-04-18
  Administered 2024-04-18: 60 mg via INTRADERMAL

## 2024-04-18 SURGICAL SUPPLY — 52 items
BAG COUNTER SPONGE SURGICOUNT (BAG) IMPLANT
BLADE SAG 18X100X1.27 (BLADE) ×1 IMPLANT
BLADE SAW SAG 35X64 .89 (BLADE) ×1 IMPLANT
BLADE SAW SGTL 11.0X1.19X90.0M (BLADE) IMPLANT
BNDG COHESIVE 3X5 TAN ST LF (GAUZE/BANDAGES/DRESSINGS) ×1 IMPLANT
BNDG ELASTIC 6INX 5YD STR LF (GAUZE/BANDAGES/DRESSINGS) IMPLANT
BNDG ELASTIC 6X10 VLCR STRL LF (GAUZE/BANDAGES/DRESSINGS) ×1 IMPLANT
BOWL SMART MIX CTS (DISPOSABLE) IMPLANT
CEMENT BONE R 1X40 (Cement) IMPLANT
CHLORAPREP W/TINT 26 (MISCELLANEOUS) ×2 IMPLANT
COMPONENT FEM CMT CR STD 9 LT (Joint) IMPLANT
COVER SURGICAL LIGHT HANDLE (MISCELLANEOUS) ×1 IMPLANT
CUFF TRNQT CYL 34X4.125X (TOURNIQUET CUFF) ×1 IMPLANT
DERMABOND ADVANCED .7 DNX12 (GAUZE/BANDAGES/DRESSINGS) ×1 IMPLANT
DRAPE INCISE IOBAN 85X60 (DRAPES) ×1 IMPLANT
DRAPE SHEET LG 3/4 BI-LAMINATE (DRAPES) ×1 IMPLANT
DRAPE U-SHAPE 47X51 STRL (DRAPES) ×1 IMPLANT
DRSG AQUACEL AG ADV 3.5X10 (GAUZE/BANDAGES/DRESSINGS) ×1 IMPLANT
ELECT REM PT RETURN 15FT ADLT (MISCELLANEOUS) ×1 IMPLANT
GAUZE SPONGE 4X4 12PLY STRL (GAUZE/BANDAGES/DRESSINGS) ×1 IMPLANT
GLOVE BIO SURGEON STRL SZ 6.5 (GLOVE) ×2 IMPLANT
GLOVE BIOGEL PI IND STRL 6.5 (GLOVE) ×1 IMPLANT
GLOVE BIOGEL PI IND STRL 8 (GLOVE) ×1 IMPLANT
GLOVE SURG ORTHO 8.0 STRL STRW (GLOVE) ×2 IMPLANT
GOWN STRL REUS W/ TWL XL LVL3 (GOWN DISPOSABLE) ×2 IMPLANT
HOLDER FOLEY CATH W/STRAP (MISCELLANEOUS) ×1 IMPLANT
HOOD PEEL AWAY T7 (MISCELLANEOUS) ×3 IMPLANT
INSERT ARTISURF SZ 8-11 EF 13 (Insert) IMPLANT
KIT TURNOVER KIT A (KITS) ×1 IMPLANT
MANIFOLD NEPTUNE II (INSTRUMENTS) ×1 IMPLANT
MARKER SKIN DUAL TIP RULER LAB (MISCELLANEOUS) ×1 IMPLANT
NS IRRIG 1000ML POUR BTL (IV SOLUTION) ×1 IMPLANT
PACK TOTAL KNEE CUSTOM (KITS) ×1 IMPLANT
PENCIL SMOKE EVACUATOR (MISCELLANEOUS) ×1 IMPLANT
PIN DRILL HDLS TROCAR 75 4PK (PIN) IMPLANT
SCREW HEADED 33MM KNEE (MISCELLANEOUS) IMPLANT
SET HNDPC FAN SPRY TIP SCT (DISPOSABLE) ×1 IMPLANT
SOLUTION IRRIG SURGIPHOR (IV SOLUTION) IMPLANT
SOLUTION PRONTOSAN WOUND 350ML (IRRIGATION / IRRIGATOR) IMPLANT
STEM POLY PAT PLY 32M KNEE (Knees) IMPLANT
STEM TIBIA 5 DEG SZ E L KNEE (Knees) IMPLANT
STRIP CLOSURE SKIN 1/2X4 (GAUZE/BANDAGES/DRESSINGS) ×1 IMPLANT
SUT MNCRL AB 3-0 PS2 18 (SUTURE) ×1 IMPLANT
SUT STRATAFIX 14 PDO 48 VLT (SUTURE) ×1 IMPLANT
SUT VIC AB 2-0 CT2 27 (SUTURE) ×2 IMPLANT
SUTURE STRATFX 0 PDS 27 VIOLET (SUTURE) ×1 IMPLANT
SYR 50ML LL SCALE MARK (SYRINGE) ×1 IMPLANT
TRAY FOLEY MTR SLVR 14FR STAT (SET/KITS/TRAYS/PACK) IMPLANT
TUBE SUCTION HIGH CAP CLEAR NV (SUCTIONS) ×1 IMPLANT
UNDERPAD 30X36 HEAVY ABSORB (UNDERPADS AND DIAPERS) ×1 IMPLANT
WATER STERILE IRR 1000ML POUR (IV SOLUTION) IMPLANT
WRAP KNEE MAXI GEL POST OP (GAUZE/BANDAGES/DRESSINGS) ×1 IMPLANT

## 2024-04-18 NOTE — Anesthesia Procedure Notes (Signed)
 Procedure Name: Intubation Date/Time: 04/18/2024 9:36 AM  Performed by: Nada Corean CROME, CRNAPre-anesthesia Checklist: Emergency Drugs available Patient Re-evaluated:Patient Re-evaluated prior to induction Oxygen Delivery Method: Circle system utilized Preoxygenation: Pre-oxygenation with 100% oxygen Induction Type: Cricoid Pressure applied and IV induction Laryngoscope Size: Mac and 3 Grade View: Grade II Tube type: Oral Tube size: 6.5 mm Number of attempts: 1 Airway Equipment and Method: Stylet Placement Confirmation: ETT inserted through vocal cords under direct vision, positive ETCO2 and breath sounds checked- equal and bilateral Secured at: 20 cm Tube secured with: Tape Dental Injury: Teeth and Oropharynx as per pre-operative assessment  Comments: Conversion from LMA to GETA

## 2024-04-18 NOTE — Progress Notes (Signed)
 0430 Took I/2 tablet Hydrocodone  10-325 po for pain as well as the Tylenol  250 mg as mentioned.

## 2024-04-18 NOTE — Progress Notes (Signed)
 0700 Took  Tylenol  250 mg at 0430
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
## 2024-04-18 NOTE — Op Note (Signed)
 DATE OF SURGERY:  04/18/2024 TIME: 10:43 AM  PATIENT NAME:  Deborah [name]   AGE: 73 y.o.    PRE-OPERATIVE DIAGNOSIS: End-stage left knee osteoarthritis  POST-OPERATIVE DIAGNOSIS:  Same  PROCEDURE: Cemented nickel free left total Knee Arthroplasty  SURGEON:  Shuaib Corsino A Zari Cly, MD   ASSISTANT: Jon Hurst, RNFA, present and scrubbed throughout the case, critical for assistance with exposure, retraction, instrumentation, and closure.   OPERATIVE IMPLANTS:  Cemented Zimmer persona size 9 nickel free CR femur, size E left tibial baseplate, 13 mm MC poly 32 mm all poly cemented patella Implant Name Type Inv. Item Serial No. Manufacturer Lot No. LRB No. Used Action  CEMENT BONE R 1X40 - [id number] Cement CEMENT BONE R 1X40  [employer]IMMER [employer]ON(ORTH,TRAU,BIO,SG) [id number] Left 1 Implanted  CEMENT BONE R 1X40 - [id number] Cement CEMENT BONE R 1X40  [employer](ORTH,TRAU,BIO,SG) [id number] Left 1 Implanted  STEM POLY PAT PLY 57M KNEE - [id number] Knees STEM POLY PAT PLY 57M KNEE  [employer]ON(ORTH,TRAU,BIO,SG) [id number] Left 1 Implanted  STEM TIBIA 5 DEG SZ E L KNEE - [id number] Knees STEM TIBIA 5 DEG SZ E L KNEE  [employer]MER [employer]ON(ORTH,TRAU,BIO,SG) [id number] Left 1 Implanted  COMPONENT FEM CMT CR STD 9 LT - [id number] Joint COMPONENT FEM CMT CR STD 9 LT  [employer]MER [employer]ON(ORTH,TRAU,BIO,SG) [id number] Left 1 Implanted  INSERT ARTISURF SZ 8-11 EF 13 - [id number] Insert INSERT ARTISURF SZ 8-11 EF 13  [employer]IMMER [employer]ON(ORTH,TRAU,BIO,SG) [id number] Left 1 Implanted      PREOPERATIVE INDICATIONS:  Deborah [name] is a 73 y.o. year old female with end stage bone on bone degenerative arthritis of the knee who failed conservative treatment, including injections, antiinflammatories, activity modification, and assistive devices, and had significant impairment of their activities of daily living, and elected for Total Knee Arthroplasty.   The risks, benefits, and alternatives were discussed at length including but not  limited to the risks of infection, bleeding, nerve injury, stiffness, blood clots, the need for revision surgery, cardiopulmonary complications, among others, and they were willing to proceed.   ESTIMATED BLOOD LOSS: 50cc  OPERATIVE DESCRIPTION:   Once adequate anesthesia was induced, preoperative antibiotics, 2 gm of ancef ,1 gm of Tranexamic Acid , and 8 mg of Decadron  administered, the patient was positioned supine with a left thigh tourniquet placed.  The left lower extremity was prepped and draped in sterile fashion.  A time-  out was performed identifying the patient, planned procedure, and the appropriate extremity.     The leg was  exsanguinated, tourniquet elevated to 250 mmHg.  A midline incision was  made followed by median parapatellar arthrotomy. Anterior horn of the medial meniscus was released and resected. A medial release was performed, the infrapatellar fat pad was resected with care taken to protect the patellar tendon. The suprapatellar fat was removed to exposed the distal anterior femur. The anterior horn of the lateral meniscus and ACL were released.    Following initial  exposure, I first started with the femur  The femoral  canal was opened with a drill, canal was suctioned to try to prevent fat emboli.  An  intramedullary rod was passed set at 6 degrees valgus, 10 mm. The distal femur was resected.  Following this resection, the tibia was  subluxated anteriorly.  Using the extramedullary guide, 10 mm of bone was resected off   the proximal lateral tibia.  We confirmed the gap would be  stable medially and laterally with a size 10mm spacer block as well as confirmed that the  tibial cut was perpendicular in the coronal plane, checking with an alignment rod.    Once this was done, the posterior femoral referencing femoral sizer was placed under to the posterior condyles with 3 degrees of external rotational which was parallel to the transepicondylar axis and perpendicular to  Dyne[employer]. The femur was sized to be a size 9 in the anterior-  posterior dimension. The  anterior, posterior, and  chamfer cuts were made without difficulty nor   notching making certain that I was along the anterior cortex to help  with flexion gap stability. Next a laminar spreader was placed with the knee in flexion and the medial lateral menisci were resected.  5 cc of the Exparel  mixture was injected in the medial side of the back of the knee and 3 cc in the lateral side.  1/2 inch curved osteotome was used to resect posterior osteophyte that was then removed with a pituitary rongeur.       At this point, the tibia was sized to be a size E.  The size E tray was  then pinned in position. Trial reduction was now carried with a 9 femur, E tibia, a 10 mm MC insert.  There was hyperextension and laxity with the 10 mm insert felt we had appropriate stability once upsized to a 13 mm insert.  The knee had full extension and was stable to varus valgus stress in extension.  The knee was slightly tight in flexion and the PCL was partially released.   Attention was next directed to the patella.  Precut  measurement was noted to be 22 mm.  I resected down to 13 mm and used a  32mm patellar button to restore patellar height as well as cover the cut surface.     The patella lug holes were drilled and a 32mm patella poly trial was placed.    The knee was brought to full extension with good flexion stability with the patella tracking through the trochlea without application of pressure.     Next the femoral component was again assessed and determined to be seated and appropriately lateralized.  The femoral lug holes were drilled.  The femoral component was then removed. Tibial component was again assessed and felt to be seated and appropriately rotated with the medial third of the tubercle. The tibia was then drilled, and keel punched.     Final components were  opened and cement was mixed.      Final  implants were then  cemented onto cleaned and dried cut surfaces of bone with the knee brought to extension with a 13mm MC poly.  The knee was irrigated with sterile Betadine  diluted in saline as well as pulse lavage normal saline.  The synovial lining was  then injected a dilute Exparel  with 30cc of 0.25% marcaine  with epinephrine .         Once the cement had fully cured, excess cement was removed throughout the knee.  I confirmed that I was satisfied with the range of motion and stability, and the final 13 mm MC poly insert was chosen.  It was placed into the knee.         The tourniquet had been let down at 70 minutes.  No significant hemostasis was required.  The medial parapatellar arthrotomy was then reapproximated using #1 Stratafix sutures with the knee  in flexion.  The remaining wound was closed with 0 stratafix, 2-0 Vicryl, and running 3-0 Monocryl. The knee was cleaned, dried,  dressed sterilely using Dermabond and   Aquacel dressing.  The patient was then brought to recovery room in stable condition, tolerating the procedure  well. There were no complications.   Post op recs: WB: WBAT Abx: ancef  Imaging: PACU xrays DVT prophylaxis: Aspirin  81mg  BID x4 weeks Follow up: 2 weeks after surgery for a wound check with Dr. Edna at [facility].  Address: [address], [place], [place] [address]  Office Phone: [phone]  Toribio Edna, MD Orthopaedic Surgery

## 2024-04-18 NOTE — Discharge Instructions (Signed)
 INSTRUCTIONS AFTER JOINT REPLACEMENT   Remove items at home which could result in a fall. This includes throw rugs or furniture in walking pathways ICE to the affected joint every three hours while awake for 30 minutes at a time, for at least the first 3-5 days, and then as needed for pain and swelling.  Continue to use ice for pain and swelling. You may notice swelling that will progress down to the foot and ankle.  This is normal after surgery.  Elevate your leg when you are not up walking on it.   Continue to use the breathing machine you got in the hospital (incentive spirometer) which will help keep your temperature down.  It is common for your temperature to cycle up and down following surgery, especially at night when you are not up moving around and exerting yourself.  The breathing machine keeps your lungs expanded and your temperature down.  DIET:  As you were doing prior to hospitalization, we recommend a well-balanced diet.  DRESSING / WOUND CARE / SHOWERING:  Keep the surgical dressing until follow up.  The dressing is water  proof, so you can shower without any extra covering.  IF THE DRESSING FALLS OFF or the wound gets wet inside, change the dressing with sterile gauze.  Please use good hand washing techniques before changing the dressing.  Do not use any lotions or creams on the incision until instructed by your surgeon.    ACTIVITY  Increase activity slowly as tolerated, but follow the weight bearing instructions below.   No driving for 6 weeks or until further direction given by your physician.  You cannot drive while taking narcotics.  No lifting or carrying greater than 10 lbs. until further directed by your surgeon. Avoid periods of inactivity such as sitting longer than an hour when not asleep. This helps prevent blood clots.  You may return to work once you are authorized by your doctor.   WEIGHT BEARING: Weight bearing as tolerated with assist device (walker, cane, etc) as  directed, use it as long as suggested by your surgeon or therapist, typically at least 4-6 weeks.  EXERCISES  Results after joint replacement surgery are often greatly improved when you follow the exercise, range of motion and muscle strengthening exercises prescribed by your doctor. Safety measures are also important to protect the joint from further injury. Any time any of these exercises cause you to have increased pain or swelling, decrease what you are doing until you are comfortable again and then slowly increase them. If you have problems or questions, call your caregiver or physical therapist for advice.   Rehabilitation is important following a joint replacement. After just a few days of immobilization, the muscles of the leg can become weakened and shrink (atrophy).  These exercises are designed to build up the tone and strength of the thigh and leg muscles and to improve motion. Often times heat used for twenty to thirty minutes before working out will loosen up your tissues and help with improving the range of motion but do not use heat for the first two weeks following surgery (sometimes heat can increase post-operative swelling).   These exercises can be done on a training (exercise) mat, on the floor, on a table or on a bed. Use whatever works the best and is most comfortable for you.    Use music or television while you are exercising so that the exercises are a pleasant break in your day. This will make your life  better with the exercises acting as a break in your routine that you can look forward to.   Perform all exercises about fifteen times, three times per day or as directed.  You should exercise both the operative leg and the other leg as well.  Exercises include:   Quad Sets - Tighten up the muscle on the front of the thigh (Quad) and hold for 5-10 seconds.   Straight Leg Raises - With your knee straight (if you were given a brace, keep it on), lift the leg to 60 degrees, hold  for 3 seconds, and slowly lower the leg.  Perform this exercise against resistance later as your leg gets stronger.  Leg Slides: Lying on your back, slowly slide your foot toward your buttocks, bending your knee up off the floor (only go as far as is comfortable). Then slowly slide your foot back down until your leg is flat on the floor again.  Angel Wings: Lying on your back spread your legs to the side as far apart as you can without causing discomfort.  Hamstring Strength:  Lying on your back, push your heel against the floor with your leg straight by tightening up the muscles of your buttocks.  Repeat, but this time bend your knee to a comfortable angle, and push your heel against the floor.  You may put a pillow under the heel to make it more comfortable if necessary.   A rehabilitation program following joint replacement surgery can speed recovery and prevent re-injury in the future due to weakened muscles. Contact your doctor or a physical therapist for more information on knee rehabilitation.   CONSTIPATION:  Constipation is defined medically as fewer than three stools per week and severe constipation as less than one stool per week.  Even if you have a regular bowel pattern at home, your normal regimen is likely to be disrupted due to multiple reasons following surgery.  Combination of anesthesia, postoperative narcotics, change in appetite and fluid intake all can affect your bowels.   YOU MUST use at least one of the following options; they are listed in order of increasing strength to get the job done.  They are all available over the counter, and you may need to use some, POSSIBLY even all of these options:    Drink plenty of fluids (prune juice may be helpful) and high fiber foods Colace 100 mg by mouth twice a day  Senokot for constipation as directed and as needed Dulcolax (bisacodyl ), take with full glass of water   Miralax  (polyethylene glycol) once or twice a day as needed.  If you  have tried all these things and are unable to have a bowel movement in the first 3-4 days after surgery call either your surgeon or your primary doctor.    If you experience loose stools or diarrhea, hold the medications until you stool forms back up.  If your symptoms do not get better within 1 week or if they get worse, check with your doctor.  If you experience the worst abdominal pain ever or develop nausea or vomiting, please contact the office immediately for further recommendations for treatment.  ITCHING:  If you experience itching with your medications, try taking only a single pain pill, or even half a pain pill at a time.  You can also use Benadryl  over the counter for itching or also to help with sleep.   TED HOSE STOCKINGS:  Use stockings on both legs until for at least 2 weeks or  as directed by physician office. They may be removed at night for sleeping.  MEDICATIONS:  See your medication summary on the "After Visit Summary" that nursing will review with you.  You may have some home medications which will be placed on hold until you complete the course of blood thinner medication.  It is important for you to complete the blood thinner medication as prescribed.  Blood clot prevention (DVT Prophylaxis): After surgery you are at an increased risk for a blood clot.  You were prescribed a blood thinner, Aspirin  81mg , to be taken twice daily for a total of 4 weeks from surgery to help reduce your risk of getting a blood clot.  You are also to resume your Plavix  daily starting on your second day after surgery (Wednesday 04/20/2024).  After 4 weeks, you may return to taking Aspirin  81mg  as you were doing prior to surgery.  Signs of a pulmonary embolus (blood clot in the lungs) include sudden short of breath, feeling lightheaded or dizzy, chest pain with a deep breath, rapid pulse rapid breathing.  Signs of a blood clot in your arms or legs include new unexplained swelling and cramping, warm, red  or darkened skin around the painful area.  Please call the office or 911 right away if these signs or symptoms develop.  PRECAUTIONS:   If you experience chest pain or shortness of breath - call 911 immediately for transfer to the hospital emergency department.   If you develop a fever greater that 101 F, purulent drainage from wound, increased redness or drainage from wound, foul odor from the wound/dressing, or calf pain - CONTACT YOUR SURGEON.                                                   FOLLOW-UP APPOINTMENTS:  If you do not already have a post-op appointment, please call the office for an appointment to be seen by your surgeon.  Guidelines for how soon to be seen are listed in your "After Visit Summary", but are typically between 2-3 weeks after surgery.  If you have a specialized bandage, you may be told to follow up 1 week after surgery.  OTHER INSTRUCTIONS:  Knee Replacement:  Do not place pillow under knee, focus on keeping the knee straight while resting.  Place foam block, curve side up under heel at all times except when walking.  DO NOT modify, tear, cut, or change the foam block in any way.  POST-OPERATIVE OPIOID TAPER INSTRUCTIONS: It is important to wean off of your opioid medication as soon as possible. If you do not need pain medication after your surgery it is ok to stop day one. Opioids include: Codeine, Hydrocodone (Norco, Vicodin), Oxycodone (Percocet, oxycontin ) and hydromorphone  amongst others.  Long term and even short term use of opiods can cause: Increased pain response Dependence Constipation Depression Respiratory depression And more.  Withdrawal symptoms can include Flu like symptoms Nausea, vomiting And more Techniques to manage these symptoms Hydrate well Eat regular healthy meals Stay active Use relaxation techniques(deep breathing, meditating, yoga) Do Not substitute Alcohol  to help with tapering If you have been on opioids for less than two weeks  and do not have pain than it is ok to stop all together.  Plan to wean off of opioids This plan should start within one week post op of your joint  replacement. Maintain the same interval or time between taking each dose and first decrease the dose.  Cut the total daily intake of opioids by one tablet each day Next start to increase the time between doses. The last dose that should be eliminated is the evening dose.   MAKE SURE YOU:  Understand these instructions.  Get help right away if you are not doing well or get worse.    Thank you for letting us  be a part of your medical care team.  It is a privilege we respect greatly.  We hope these instructions will help you stay on track for a fast and full recovery!

## 2024-04-18 NOTE — Plan of Care (Signed)

## 2024-04-18 NOTE — Anesthesia Procedure Notes (Signed)
 Procedure Name: LMA Insertion Date/Time: 04/18/2024 8:57 AM  Performed by: Nada Corean CROME, CRNAPre-anesthesia Checklist: Emergency Drugs available, Patient identified, Suction available, Patient being monitored and Timeout performed Patient Re-evaluated:Patient Re-evaluated prior to induction Oxygen Delivery Method: Circle system utilized Preoxygenation: Pre-oxygenation with 100% oxygen Induction Type: IV induction Ventilation: Mask ventilation without difficulty LMA: LMA inserted LMA Size: 4.0 Tube type: Oral Placement Confirmation: positive ETCO2 and breath sounds checked- equal and bilateral Tube secured with: Tape Dental Injury: Teeth and Oropharynx as per pre-operative assessment

## 2024-04-18 NOTE — Interval H&P Note (Signed)

## 2024-04-18 NOTE — Anesthesia Procedure Notes (Signed)
 Anesthesia Regional Block: Adductor canal block   Pre-Anesthetic Checklist: , timeout performed,  Correct Patient, Correct Site, Correct Laterality,  Correct Procedure, Correct Position, site marked,  Risks and benefits discussed,  Pre-op evaluation,  At surgeon's request and post-op pain management  Laterality: Left  Prep: Maximum Sterile Barrier Precautions used, chloraprep       Needles:  Injection technique: Single-shot  Needle Type: Echogenic Stimulator Needle     Needle Length: 9cm  Needle Gauge: 21     Additional Needles:   Procedures:,,,, ultrasound used (permanent image in chart),,    Narrative:  Start time: 04/18/2024 8:00 AM End time: 04/18/2024 8:10 AM Injection made incrementally with aspirations every 5 mL.  Performed by: Personally  Anesthesiologist: Epifanio Fallow, MD  Additional Notes:

## 2024-04-18 NOTE — Anesthesia Postprocedure Evaluation (Signed)
 Anesthesia Post Note  Patient: Deborah [name]  Procedure(s) Performed: ARTHROPLASTY, KNEE, TOTAL (Left: Knee)     Patient location during evaluation: PACU Anesthesia Type: General and Regional Level of consciousness: awake and alert Pain management: pain level controlled Vital Signs Assessment: post-procedure vital signs reviewed and stable Respiratory status: spontaneous breathing, nonlabored ventilation, respiratory function stable and patient connected to nasal cannula oxygen Cardiovascular status: blood pressure returned to baseline and stable Postop Assessment: no apparent nausea or vomiting Anesthetic complications: no   No notable events documented.  Last Vitals:  Vitals:   04/18/24 1230 04/18/24 1245  BP: (!) 159/69 (!) 155/70  Pulse: 66 66  Resp: 12 12  Temp:  (!) 36.4 C  SpO2: 100% 99%    Last Pain:  Vitals:   04/18/24 1245  TempSrc:   PainSc: Asleep                 Trinadee Verhagen,W. EDMOND

## 2024-04-18 NOTE — Transfer of Care (Signed)
 Immediate Anesthesia Transfer of Care Note  Patient: Deborah [name]  Procedure(s) Performed: ARTHROPLASTY, KNEE, TOTAL (Left: Knee)  Patient Location: PACU  Anesthesia Type:General and Regional  Level of Consciousness: sedated, patient cooperative, and responds to stimulation  Airway & Oxygen Therapy: Patient Spontanous Breathing and Patient connected to face mask oxygen  Post-op Assessment: Report given to RN and Post -op Vital signs reviewed and stable  Post vital signs: Reviewed and stable  Last Vitals:  Vitals Value Taken Time  BP 142/92 04/18/24 11:16  Temp    Pulse 74 04/18/24 11:19  Resp 19 04/18/24 11:19  SpO2 100 % 04/18/24 11:19  Vitals shown include unfiled device data.  Last Pain:  Vitals:   04/18/24 0810  TempSrc:   PainSc: 0-No pain         Complications: No notable events documented.

## 2024-04-18 NOTE — Progress Notes (Signed)
 Orthopedic Tech Progress Note Patient Details:  Christen Wardrop Lewis-Jones August 02, 1951 [id number] Applied bone foam per order.  Ortho Devices Type of Ortho Device: Bone foam zero knee Ortho Device/Splint Location: LLE Ortho Device/Splint Interventions: Ordered, Application, Adjustment   Post Interventions Patient Tolerated: Well Instructions Provided: Care of device, Poper ambulation with device, Adjustment of device  Morna Pink 04/18/2024, 11:39 AM

## 2024-04-19 ENCOUNTER — Encounter (HOSPITAL_COMMUNITY): Payer: Self-pay | Admitting: Orthopedic Surgery

## 2024-04-19 DIAGNOSIS — M1712 Unilateral primary osteoarthritis, left knee: Secondary | ICD-10-CM | POA: Diagnosis not present

## 2024-04-19 LAB — BASIC METABOLIC PANEL WITH GFR
Anion gap: 6 (ref 5–15)
BUN: 21 mg/dL (ref 8–23)
CO2: 27 mmol/L (ref 22–32)
Calcium: 8.8 mg/dL — ABNORMAL LOW (ref 8.9–10.3)
Chloride: 105 mmol/L (ref 98–111)
Creatinine, Ser: 0.81 mg/dL (ref 0.44–1.00)
GFR, Estimated: >60 mL/min (ref >60)
Glucose, Bld: 121 mg/dL — ABNORMAL HIGH (ref 70–99)
Potassium: 3.6 mmol/L (ref 3.5–5.1)
Sodium: 138 mmol/L (ref 135–145)

## 2024-04-19 LAB — CBC
HCT: 33.1 % — ABNORMAL LOW (ref 36.0–46.0)
Hemoglobin: 10.5 g/dL — ABNORMAL LOW (ref 12.0–15.0)
MCH: 31.9 pg (ref 26.0–34.0)
MCHC: 31.7 g/dL (ref 30.0–36.0)
MCV: 100.6 fL — ABNORMAL HIGH (ref 80.0–100.0)
Platelets: 167 K/uL (ref 150–400)
RBC: 3.29 MIL/uL — ABNORMAL LOW (ref 3.87–5.11)
RDW: 12.1 % (ref 11.5–15.5)
WBC: 8.6 K/uL (ref 4.0–10.5)
nRBC: 0 % (ref 0.0–0.2)

## 2024-04-19 NOTE — Care Management Obs Status (Signed)
 MEDICARE OBSERVATION STATUS NOTIFICATION   Patient Details  Name: Deborah [name] MRN: [id number] Date of Birth: 1951-04-25   Medicare Observation Status Notification Given:  Yes    NORMAN ASPEN, LCSW 04/19/2024, 11:09 AM
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
## 2024-04-19 NOTE — Progress Notes (Signed)
     Subjective:  Patient reports pain as mild.  She did well mobilizing with physical therapy yesterday but was limited by dizziness.  Plan for physical therapy today and hopeful for discharge home.  Denies distal numbness and tingling.SABRA Simper total administered Morphine  [place] Equivalents: 115   Objective:   VITALS:   Vitals:   04/18/24 1309 04/18/24 2050 04/19/24 0205 04/19/24 0523  BP: (!) 161/76 139/67 128/64 (!) 154/75  Pulse: 75 72 70 74  Resp: 18 18 18 18   Temp:  97.7 F (36.5 C) 98 F (36.7 C) 97.7 F (36.5 C)  TempSrc:  Oral Oral Oral  SpO2: 100% 100% 98% 99%  Weight:      Height:        Sensation intact distally Intact pulses distally Dorsiflexion/Plantar flexion intact Incision: dressing C/D/I Compartment soft    Lab Results  Component Value Date   WBC 8.6 04/19/2024   HGB 10.5 (L) 04/19/2024   HCT 33.1 (L) 04/19/2024   MCV 100.6 (H) 04/19/2024   PLT 167 04/19/2024   BMET    Component Value Date/Time   NA 138 04/19/2024 0321   NA 135 09/25/2021 1206   K 3.6 04/19/2024 0321   CL 105 04/19/2024 0321   CO2 27 04/19/2024 0321   GLUCOSE 121 (H) 04/19/2024 0321   BUN 21 04/19/2024 0321   BUN 14 09/25/2021 1206   CREATININE 0.81 04/19/2024 0321   CALCIUM  8.8 (L) 04/19/2024 0321   EGFR 92.0 09/18/2023 1058   EGFR 92 09/25/2021 1206   GFRNONAA >60 04/19/2024 0321      Xray: Total knee arthroplasty components in good position no adverse features  Assessment/Plan: 1 Day Post-Op   Principal Problem:   Primary osteoarthritis of left knee  S/p L TKA 04/18/24  Post op recs: WB: WBAT Abx: ancef  Imaging: PACU xrays DVT prophylaxis: Aspirin  81mg  BID x4 weeks Follow up: 2 weeks after surgery for a wound check with Dr. Edna at [facility].  Address: [address], [place], [place] [address]  Office Phone: [phone]    TORIBIO DELENA EDNA 04/19/2024, 6:52 AM   TORIBIO Edna, MD  Contact  information:   [phone] 7am-5pm epic message Dr. Edna, or call office for patient follow up: [phone] After hours and holidays please check [email or web address] for group call information for [facility]

## 2024-04-19 NOTE — TOC Transition Note (Signed)
 Transition of Care [facility]) - Discharge Note   Patient Details  Name: Deborah [name] MRN: [id number] Date of Birth: July 30, 1951  [facility] CM/SW Contact:  NORMAN ASPEN, LCSW Phone Number: 04/19/2024, 10:01 AM   Clinical Narrative:     Met with pt who confirms she has needed DME in the home.  OPPT already arranged with [facility].  No further TOC needs.   Final next level of care: OP Rehab Barriers to Discharge: No Barriers Identified   Patient Goals and CMS Choice Patient states their goals for this hospitalization and ongoing recovery are:: return home          Discharge Placement                       Discharge Plan and Services Additional resources added to the After Visit Summary for                  DME Arranged: N/A DME Agency: NA                  Social Drivers of Health (SDOH) Interventions SDOH Screenings   Food Insecurity: No Food Insecurity (04/18/2024)  Housing: Low Risk  (04/18/2024)  Transportation Needs: No Transportation Needs (04/18/2024)  Utilities: Not At Risk (04/18/2024)  Social Connections: Unknown (04/18/2024)  Tobacco Use: Low Risk  (04/18/2024)     Readmission Risk Interventions     No data to display

## 2024-04-19 NOTE — Progress Notes (Signed)
 Discharge instructions given to patient and family questions asked and answered D Rosevelt Constable RN

## 2024-04-29 ENCOUNTER — Other Ambulatory Visit (HOSPITAL_COMMUNITY)
Admission: RE | Admit: 2024-04-29 | Discharge: 2024-04-29 | Disposition: A | Discharge status: Home / Self Care | Source: Other Acute Inpatient Hospital | Attending: Obstetrics and Gynecology | Admitting: Obstetrics and Gynecology

## 2024-04-29 ENCOUNTER — Ambulatory Visit (INDEPENDENT_AMBULATORY_CARE_PROVIDER_SITE_OTHER)

## 2024-04-29 VITALS — BP 116/74 | HR 80

## 2024-04-29 DIAGNOSIS — R350 Frequency of micturition: Secondary | ICD-10-CM

## 2024-04-29 DIAGNOSIS — R82998 Other abnormal findings in urine: Secondary | ICD-10-CM

## 2024-04-29 LAB — POCT URINALYSIS DIP (CLINITEK)
Bilirubin, UA: NEGATIVE
Blood, UA: NEGATIVE
Glucose, UA: NEGATIVE mg/dL
Ketones, POC UA: NEGATIVE mg/dL
Leukocytes, UA: NEGATIVE
Nitrite, UA: POSITIVE — AB
POC PROTEIN,UA: NEGATIVE
Spec Grav, UA: <=1.005 — AB (ref 1.010–1.025)
Urobilinogen, UA: 0.2 U/dL
pH, UA: 6 (ref 5.0–8.0)

## 2024-04-29 NOTE — Patient Instructions (Signed)
 Your Urine dip that was done in office was Negative. I am sending the urine off for culture and you can take AZO over the counter for your discomfort.  If you have any questions or concerns please feel free to call us at [phone]

## 2024-04-29 NOTE — Progress Notes (Signed)
 Deborah [name] is a 73 y.o. female  arrived today with UTI sx.  A urine specimen was collected and POCT Urine was done. POCT Urine was Negative

## 2024-04-30 LAB — URINE CULTURE: Culture: NO GROWTH

## 2024-05-03 ENCOUNTER — Ambulatory Visit: Payer: Self-pay | Admitting: Obstetrics and Gynecology

## 2024-06-08 LAB — MISCELLANEOUS TEST

## 2024-06-28 ENCOUNTER — Ambulatory Visit: Admitting: Obstetrics and Gynecology

## 2024-06-29 LAB — COLOGUARD: COLOGUARD: POSITIVE — AB

## 2024-06-30 ENCOUNTER — Other Ambulatory Visit: Payer: Self-pay

## 2024-06-30 ENCOUNTER — Other Ambulatory Visit (HOSPITAL_COMMUNITY): Payer: Self-pay | Admitting: Orthopedic Surgery

## 2024-06-30 ENCOUNTER — Ambulatory Visit (HOSPITAL_COMMUNITY)
Admission: RE | Admit: 2024-06-30 | Discharge: 2024-06-30 | Disposition: A | Discharge status: Home / Self Care | Source: Ambulatory Visit | Attending: Vascular Surgery | Admitting: Vascular Surgery

## 2024-06-30 ENCOUNTER — Other Ambulatory Visit (HOSPITAL_COMMUNITY)
Admission: RE | Admit: 2024-06-30 | Discharge: 2024-06-30 | Disposition: A | Discharge status: Home / Self Care | Source: Ambulatory Visit | Attending: Obstetrics and Gynecology | Admitting: Obstetrics and Gynecology

## 2024-06-30 ENCOUNTER — Ambulatory Visit (INDEPENDENT_AMBULATORY_CARE_PROVIDER_SITE_OTHER)

## 2024-06-30 VITALS — BP 125/68 | HR 84

## 2024-06-30 DIAGNOSIS — R350 Frequency of micturition: Secondary | ICD-10-CM | POA: Diagnosis not present

## 2024-06-30 DIAGNOSIS — I25118 Atherosclerotic heart disease of native coronary artery with other forms of angina pectoris: Secondary | ICD-10-CM

## 2024-06-30 DIAGNOSIS — M7989 Other specified soft tissue disorders: Secondary | ICD-10-CM | POA: Diagnosis present

## 2024-06-30 DIAGNOSIS — M79605 Pain in left leg: Secondary | ICD-10-CM | POA: Insufficient documentation

## 2024-06-30 DIAGNOSIS — R319 Hematuria, unspecified: Secondary | ICD-10-CM | POA: Diagnosis present

## 2024-06-30 LAB — URINALYSIS, COMPLETE (UACMP) WITH MICROSCOPIC
Bacteria, UA: NONE SEEN
Bilirubin Urine: NEGATIVE
Glucose, UA: NEGATIVE mg/dL
Hgb urine dipstick: NEGATIVE
Ketones, ur: NEGATIVE mg/dL
Leukocytes,Ua: NEGATIVE
Nitrite: NEGATIVE
Protein, ur: NEGATIVE mg/dL
Specific Gravity, Urine: 1.017 (ref 1.005–1.030)
pH: 6 (ref 5.0–8.0)

## 2024-06-30 LAB — POCT URINALYSIS DIP (CLINITEK)
Bilirubin, UA: NEGATIVE
Glucose, UA: NEGATIVE mg/dL
Ketones, POC UA: NEGATIVE mg/dL
Leukocytes, UA: NEGATIVE
Nitrite, UA: NEGATIVE
POC PROTEIN,UA: NEGATIVE
Spec Grav, UA: 1.015 (ref 1.010–1.025)
Urobilinogen, UA: 0.2 U/dL
pH, UA: 6.5 (ref 5.0–8.0)

## 2024-06-30 MED ORDER — CARVEDILOL 3.125 MG PO TABS: 3.1250 mg | ORAL_TABLET | Freq: Two times a day (BID) | ORAL | 0 refills | Status: AC

## 2024-06-30 NOTE — Patient Instructions (Addendum)
Your Urine dip that was done in office was Positive. I am sending the urine off for culture and you can take AZO over the counter for your discomfort.  We will contact you when the results are back between 3-5 days. If a different antibiotic is needed we will sent the order to the pharmacy and you will be notified. If you have any questions or concerns please feel free to call us at [phone]

## 2024-06-30 NOTE — Progress Notes (Unsigned)
 Deborah [name] is a 73 y.o. female  arrived today with UTI sx.  A urine specimen was collected and POCT Urine was done. POCT Urine was Positive for Blood in urine Urine was sent for culture

## 2024-07-01 LAB — URINE CULTURE: Culture: NO GROWTH

## 2024-07-04 ENCOUNTER — Ambulatory Visit: Payer: Self-pay | Admitting: Obstetrics and Gynecology

## 2024-07-11 ENCOUNTER — Ambulatory Visit: Admitting: Obstetrics and Gynecology

## 2024-07-21 ENCOUNTER — Ambulatory Visit: Admitting: Obstetrics and Gynecology

## 2024-07-22 ENCOUNTER — Ambulatory Visit: Admitting: Obstetrics and Gynecology

## 2024-07-22 ENCOUNTER — Encounter: Payer: Self-pay | Admitting: Obstetrics and Gynecology

## 2024-07-22 VITALS — BP 120/80 | HR 72

## 2024-07-22 DIAGNOSIS — N816 Rectocele: Secondary | ICD-10-CM

## 2024-07-22 DIAGNOSIS — N812 Incomplete uterovaginal prolapse: Secondary | ICD-10-CM | POA: Diagnosis not present

## 2024-07-22 DIAGNOSIS — N3281 Overactive bladder: Secondary | ICD-10-CM | POA: Diagnosis not present

## 2024-07-22 DIAGNOSIS — N993 Prolapse of vaginal vault after hysterectomy: Secondary | ICD-10-CM

## 2024-07-22 DIAGNOSIS — N3289 Other specified disorders of bladder: Secondary | ICD-10-CM

## 2024-07-22 DIAGNOSIS — R350 Frequency of micturition: Secondary | ICD-10-CM

## 2024-07-22 MED ORDER — VIBEGRON 75 MG PO TABS: 75.0000 mg | ORAL_TABLET | Freq: Every day | ORAL | 5 refills | Status: AC

## 2024-07-22 NOTE — Patient Instructions (Addendum)
 Start the samples of Gemtesa  75mg  daily.   We will see if we can get a tier exception. Please continue estrogen cream x2 weekly.   Pumpkin seeds are a natural food that can be helpful for overactive bladder.
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
## 2024-07-26 ENCOUNTER — Other Ambulatory Visit: Payer: Self-pay

## 2024-08-03 ENCOUNTER — Telehealth: Payer: Self-pay

## 2024-08-03 NOTE — Telephone Encounter (Signed)
   Pre-operative Risk Assessment    Patient Name: Deborah [name]  DOB: Feb 15, 1951 MRN: [id number]   Date of last office visit: 09/22/23  Dr. Liborio Date of next office visit: NA    Request for Surgical Clearance    Procedure:  Colonoscopy  Date of Surgery:  Clearance 08/11/24                               Surgeon:  Dr. Rollin Socks Group or Practice Name:  [facility] Phone number:  [phone] Fax number:  [phone]   Type of Clearance Requested:   - Medical  - Pharmacy:  Hold Clopidogrel  (Plavix )     Type of Anesthesia:  Propofol    Additional requests/questions:    [name]   08/03/2024, 12:50 PM

## 2024-08-03 NOTE — Telephone Encounter (Signed)
 Called patient and left a detailed message to schedule pre-op clearance. 1st attempt.

## 2024-08-03 NOTE — Telephone Encounter (Signed)
   Name: Deborah [name]  DOB: [birth year]  MRN: [id number]  Primary Cardiologist: Redell Leiter, MD  Chart reviewed as part of pre-operative protocol coverage. Because of Deborah [name]'s past medical history and time since last visit, she will require a follow-up in-office visit in order to better assess preoperative cardiovascular risk.  Pre-op covering staff: - Please schedule appointment and call patient to inform them. If patient already had an upcoming appointment within acceptable timeframe, please add pre-op clearance to the appointment notes so provider is aware. - Please contact requesting surgeon's office via preferred method (i.e, phone, fax) to inform them of need for appointment prior to surgery.   Pt with complex cardiac history last seen 11 months ago. Previously not cleared to hold plavix . Will need MD input for plavix  hold. ISR in distal portion of stent in LAD.   Jon Garre Dylen Mcelhannon, PA  08/03/2024, 2:15 PM

## 2024-08-04 NOTE — Telephone Encounter (Signed)
 2nd attempt to reach pt who needs an IN OFFICE APPT FOR PREOP CLEARANCE.  I will update the requesting office pt needs to call to schedule appt in office per DR. Madireddy.  Our office only received the request 08/03/24 for 08/11/24.

## 2024-08-05 NOTE — Telephone Encounter (Signed)
 Called patient to inform her that she will not be able to hold her plavix  until she sees Reche Finder. Patient understands

## 2024-08-05 NOTE — Telephone Encounter (Addendum)
 Was asked to review if patient able to hold her Plavix  prior to OV. Team was unable to reach her 12/3 for OV, now scheduled next available spot 12/9 in [facility] location. Colonoscopy being done due to positive cologuard per report. Given complex hx and prior notes about needing in person OV I looped in DOD Dr. Barbaraann as patient inquired whether she could begin holding her Plavix  before that visit. Dr. Barbaraann recommended to have OV before stopping to evaluate if safe to do so. Lesley aware to relay to patient.  Addendum: Dr. Maddireddy replied in secure chat that patient may begin holding her Plavix  and switch to aspirin , which will allow her to keep the 12/11 colonoscopy as scheduled so long as cleared at OV as planned. I asked Jolan to give the patient the update that she may begin holding Plavix  starting tomorrow (5 days before colonoscopy) and switch to aspirin  while off Plavix , and continue plan for close follow-up as scheduled.

## 2024-08-05 NOTE — Telephone Encounter (Signed)
 Called patient to schedule her an office visit for a per-op clearance on 08/09/24 @ 2:45. Advise patient to call her GI to reschedule her colonoscopy due that she needs to stop her Plavix . Patient wanted to see if she can stop Plavix  now. Waiting on the on call doctor for advise.

## 2024-08-05 NOTE — Telephone Encounter (Signed)
 Called patient to inform her that Dr. Maddireddy stated that she may begin holding Plavix  starting tomorrow (08/06/24) (5 days before colonoscopy) and switch to aspirin  while off Plavix . Informed patient to still keep her follow-up appointment of  12/9 with Caitlin. Patient verbally understood.

## 2024-08-09 ENCOUNTER — Encounter (HOSPITAL_BASED_OUTPATIENT_CLINIC_OR_DEPARTMENT_OTHER): Payer: Self-pay | Admitting: Family

## 2024-08-09 ENCOUNTER — Ambulatory Visit (INDEPENDENT_AMBULATORY_CARE_PROVIDER_SITE_OTHER): Admitting: Family

## 2024-08-09 VITALS — BP 120/66 | HR 77 | Ht 62.0 in | Wt 141.4 lb

## 2024-08-09 DIAGNOSIS — I25118 Atherosclerotic heart disease of native coronary artery with other forms of angina pectoris: Secondary | ICD-10-CM

## 2024-08-09 DIAGNOSIS — Z01810 Encounter for preprocedural cardiovascular examination: Secondary | ICD-10-CM

## 2024-08-09 DIAGNOSIS — E782 Mixed hyperlipidemia: Secondary | ICD-10-CM

## 2024-08-09 NOTE — Patient Instructions (Signed)
 Medication Instructions:  Continue your current medications.  *If you need a refill on your cardiac medications before your next appointment, please call your pharmacy*  Testing/Procedures: Your EKG today looked great!  Follow-Up: At [facility], you and your health needs are our priority.  As part of our continuing mission to provide you with exceptional heart care, our providers are all part of one team.  This team includes your primary Cardiologist (physician) and Advanced Practice Providers or APPs (Physician Assistants and Nurse Practitioners) who all work together to provide you with the care you need, when you need it.  Your next appointment:   6 month(s)  Provider:   Alean Kobus, MD    We recommend signing up for the patient portal called MyChart.  Sign up information is provided on this After Visit Summary.  MyChart is used to connect with patients for Virtual Visits (Telemedicine).  Patients are able to view lab/test results, encounter notes, upcoming appointments, etc.  Non-urgent messages can be sent to your provider as well.   To learn more about what you can do with MyChart, go to [email or web address].   Other Instructions  We have sent a note to Dr. Rollin that you are good for your colonoscopy. Hold Plavix  5 days prior. Resume after procedure when given permission by Dr. Rollin.  Heart Healthy Diet Recommendations: A low-salt diet is recommended. Meats should be grilled, baked, or boiled. Avoid fried foods. Focus on lean protein sources like fish or chicken with vegetables and fruits. The American Heart Association is a Chief Technology Officer!  American Heart Association Diet and Lifeystyle Recommendations   Exercise recommendations: The American Heart Association recommends 150 minutes of moderate intensity exercise weekly. Try 30 minutes of moderate intensity exercise 4-5 times per week. This could include walking, jogging, or swimming.

## 2024-08-09 NOTE — Progress Notes (Signed)
 Cardiology Office Note   Date:  08/09/2024  ID:  Deborah [name], DOB [birth year], MRN [id number] PCP: Addie Camellia CROME, MD  [facility] Providers Cardiologist:  Alean SAUNDERS Madireddy, MD     History of Present Illness Deborah [name] is a 72 y.o. female with a history of CAD s/p MI and V-fib arrest 9/20,006 requiring PCI LAD and most recent 06/2017 unstable angina with severe ISR of LAD and moderate nonobstructive disease of LCx requiring Cutting Balloon angioplasty of LAD.  Additional history includes HTN, ADD, hyperlipidemia, COPD, hypothyroidism, chronic back pain with prior back surgery.  She has not tolerated higher doses of rosuvastatin  and remains on 10 mg every other day.  Echocardiogram 10/22/2023 normal LVEF 60 to 65%, no RWMA, mild LVH, grade 2 diastolic dysfunction, RV normal, mildly elevated PASP, trace to mild AI.  Presents today for preop clearance for colonoscopy scheduled 08/11/2024 with Dr. Rollin. Pleasant loquacious lady.  The request was made to hold Plavix , she was given permission to start hold. Reports she did not tolerate Gemtessa with spots on her hands, encouraged to discuss with urogyn at her upcoming visit in February. Enjoys walking her dog for exercise twice per day. She has been recovering from her knee surgery in August. She is doing chair yoga at the [facility].  ROS: Please see the history of present illness.    All other systems reviewed and are negative.   Studies Reviewed EKG Interpretation Date/Time:  Tuesday August 09 2024 15:19:29 EST Ventricular Rate:  77 PR Interval:  120 QRS Duration:  74 QT Interval:  386 QTC Calculation: 436 R Axis:   -7  Text Interpretation: Normal sinus rhythm Prior septal infarct, stable from previous. No acute ST/T wave changes Confirmed by Vannie Mora ([id number]) on 08/09/2024 3:25:16 PM    Cardiac Studies & Procedures    ______________________________________________________________________________________________ CARDIAC CATHETERIZATION  CARDIAC CATHETERIZATION 06/01/2017  Conclusion  2nd Diag lesion, 20 %stenosed.  1st Mrg lesion, 40 %stenosed.  The left ventricular ejection fraction is greater than 65% by visual estimate.  The left ventricular systolic function is normal.  LV end diastolic pressure is normal.  Dist LAD lesion, 40 %stenosed.  Mid LAD lesion, 20 %stenosed.  Prox LAD lesion, 95 %stenosed.  Post intervention, there is a 0% residual stenosis.  Hyperdynamic LV function with an ejection fraction greater than 65%.  Two vessel coronary artery disease with a 95% distal in-stent restenosis in the patient's previously placed LAD stent between the first and second diagonal vessel, 20% smooth narrowing of the diagonal vessel after the stent, 20% LAD stenosis after the second diagonal vessel, 40% mid LAD stenosis, and apical 50% LAD stenosis in a small caliber apical LAD segment; 40% stenosis in the OM1 branch of the left circumflex coronary artery; and Normal RCA.  Successful percutaneous coronary intervention to the 95% in-stent restenosis treated with [facility] Cutting Balloons 2.5 x 10 and 3.0 x 10 mm with a 95% stenosis being reduced to 0%.  RECOMMENDATION: The patient will resume dual antiplatelet therapy  Ideally for at least a year.  High potency statin therapy.  Concomitant medical therapy for CAD.  The patient will return to the cardiology care of Dr. Jacques Somerset.  Findings Coronary Findings Diagnostic  Dominance: Right  Left Main Vessel is angiographically normal.  Left Anterior Descending The LAD is a moderate size vessel.  After the takeoff of a small first diagonal septal perforating artery.  The LAD is stented extending up to the takeoff of  the second diagonal vessel.  The distal portion of the stent.  There is focal 95% in-stent restenosis.  There is mild 10-20%  proximal narrowing in the diagonal branch daily after distended, 20% narrowing in the LAD after the diagonal vessel, 40% mid LAD stenosis, and at the apex.  There is 50% narrowing in the small caliber vessel (not shown on the diagram since only 2 lesions can be demonstrated in a segment of the vessel). The lesion was previously treated.  First Diagonal Branch Vessel is small in size.  First Septal Branch Vessel is small in size.  Second Diagonal Branch  Second Septal Branch Vessel is small in size.  Left Circumflex Vessel was injected. Vessel is angiographically normal. The left circumflex vessel gave rise to one major marginal branch.  There is 40% narrowing in the marginal vessel.  First Obtuse Marginal Branch  Right Coronary Artery Vessel was injected. Vessel is normal in caliber. Vessel is angiographically normal.  Intervention  Prox LAD lesion Angioplasty Angioplasty alone was performed using a BALLOON WOLVERINE 2.50X10. Supplies used: BALLOON WOLVERINE 3.00X10 There is a 0% residual stenosis post intervention.     ECHOCARDIOGRAM  ECHOCARDIOGRAM COMPLETE 10/22/2023  Narrative ECHOCARDIOGRAM REPORT    Patient Name:   Deborah [name] Date of Exam: 10/22/2023 Medical Rec #:  [id number]          Height:       62.0 in Accession #:    [id number]         Weight:       139.0 lb Date of Birth:  20-Sep-1950           BSA:          1.638 m Patient Age:    72 years           BP:           116/71 mmHg Patient Gender: F                  HR:           71 bpm. Exam Location:  [place]  Procedure: 2D Echo, Cardiac Doppler and Color Doppler (Both Spectral and Color Flow Doppler were utilized during procedure).  Indications:    Preoperative cardiovascular examination [Z01.810 (ICD-10-CM)]; Coronary artery disease involving native coronary artery of native heart without angina pectoris [I25.10 (ICD-10-CM)  History:        Patient has no prior history of Echocardiogram  examinations. CAD and Previous Myocardial Infarction, COPD; Risk Factors:Dyslipidemia, Hypertension and Non-Smoker.  Sonographer:    Alan Greenhouse RDMS, RVT, RDCS Referring Phys: [id number] ALEAN SAUNDERS MADIREDDY  IMPRESSIONS   1. Left ventricular ejection fraction, by estimation, is 60 to 65%. The left ventricle has normal function. The left ventricle has no regional wall motion abnormalities. There is mild left ventricular hypertrophy. 2. Left ventricular diastolic parameters are consistent with Grade II diastolic dysfunction (pseudonormalization). 3. Right ventricular systolic function is normal. The right ventricular size is normal. 4. There is mildly elevated pulmonary artery systolic pressure. The estimated right ventricular systolic pressure is 36.6 mmHg. 5. Left atrial size was moderately dilated. 6. Right atrial size was mildly dilated. 7. The mitral valve is grossly normal. Trivial to mild mitral valve regurgitation. No evidence of mitral stenosis. 8. The aortic valve is tricuspid. There is mild thickening of the aortic valve. Aortic valve regurgitation is trace to mild. No aortic stenosis is present. 9. The inferior vena cava is normal in size with greater  than 50% respiratory variability, suggesting right atrial pressure of 3 mmHg.  Comparison(s): Cardiac catheterization done 06/01/17 showed an EF of 65%.  FINDINGS Left Ventricle: Left ventricular ejection fraction, by estimation, is 60 to 65%. The left ventricle has normal function. The left ventricle has no regional wall motion abnormalities. Strain imaging was not performed. The left ventricular internal cavity size was normal in size. There is mild left ventricular hypertrophy. Left ventricular diastolic parameters are consistent with Grade II diastolic dysfunction (pseudonormalization).  Right Ventricle: The right ventricular size is normal. Right vetricular wall thickness was not well visualized. Right ventricular systolic  function is normal. There is mildly elevated pulmonary artery systolic pressure. The tricuspid regurgitant velocity is 2.90 m/s, and with an assumed right atrial pressure of 3 mmHg, the estimated right ventricular systolic pressure is 36.6 mmHg.  Left Atrium: Left atrial size was moderately dilated.  Right Atrium: Right atrial size was mildly dilated.  Pericardium: There is no evidence of pericardial effusion.  Mitral Valve: The mitral valve is grossly normal. Trivial mitral valve regurgitation. No evidence of mitral valve stenosis.  Tricuspid Valve: The tricuspid valve is grossly normal. Tricuspid valve regurgitation is mild . No evidence of tricuspid stenosis.  Aortic Valve: The aortic valve is tricuspid. There is mild thickening of the aortic valve. Aortic valve regurgitation is trivial. Aortic regurgitation PHT measures 897 msec. No aortic stenosis is present. Aortic valve mean gradient measures 6.0 mmHg. Aortic valve peak gradient measures 8.8 mmHg. Aortic valve area, by VTI measures 1.64 cm.  Pulmonic Valve: The pulmonic valve was not well visualized. Pulmonic valve regurgitation is not visualized. No evidence of pulmonic stenosis.  Aorta: The aortic root and ascending aorta are structurally normal, with no evidence of dilitation.  Venous: The inferior vena cava is normal in size with greater than 50% respiratory variability, suggesting right atrial pressure of 3 mmHg.  IAS/Shunts: The interatrial septum was not well visualized.  Additional Comments: 3D imaging was not performed.   LEFT VENTRICLE PLAX 2D LVIDd:         4.00 cm     Diastology LVIDs:         2.70 cm     LV e' medial:    8.16 cm/s LV PW:         1.16 cm     LV E/e' medial:  9.8 LV IVS:        1.09 cm     LV e' lateral:   8.27 cm/s LVOT diam:     1.70 cm     LV E/e' lateral: 9.7 LV SV:         60 LV SV Index:   36 LVOT Area:     2.27 cm  LV Volumes (MOD) LV vol d, MOD A2C: 62.2 ml LV vol d, MOD A4C: 66.8  ml LV vol s, MOD A2C: 24.8 ml LV vol s, MOD A4C: 22.4 ml LV SV MOD A2C:     37.4 ml LV SV MOD A4C:     66.8 ml LV SV MOD BP:      43.1 ml  RIGHT VENTRICLE RV S prime:     10.64 cm/s TAPSE (M-mode): 2.3 cm  LEFT ATRIUM             Index        RIGHT ATRIUM           Index LA diam:        3.30 cm 2.01 cm/m   RA  Area:     16.50 cm LA Vol (A2C):   74.6 ml 45.54 ml/m  RA Volume:   44.70 ml  27.29 ml/m LA Vol (A4C):   69.5 ml 42.43 ml/m LA Biplane Vol: 73.9 ml 45.12 ml/m AORTIC VALVE AV Area (Vmax):    1.53 cm AV Area (Vmean):   1.59 cm AV Area (VTI):     1.64 cm AV Vmax:           148.00 cm/s AV Vmean:          113.000 cm/s AV VTI:            0.364 m AV Peak Grad:      8.8 mmHg AV Mean Grad:      6.0 mmHg LVOT Vmax:         99.90 cm/s LVOT Vmean:        79.100 cm/s LVOT VTI:          0.263 m LVOT/AV VTI ratio: 0.72 AI PHT:            897 msec AR Vena Contracta: 0.30 cm  AORTA Ao Root diam: 2.80 cm Ao Asc diam:  3.30 cm  MITRAL VALVE               TRICUSPID VALVE MV Area (PHT): 3.68 cm    TR Peak grad:   33.6 mmHg MV Decel Time: 206 msec    TR Vmax:        290.00 cm/s MR Peak grad: 90.6 mmHg MR Vmax:      476.00 cm/s  SHUNTS MV E velocity: 80.10 cm/s  Systemic VTI:  0.26 m MV A velocity: 47.60 cm/s  Systemic Diam: 1.70 cm MV E/A ratio:  1.68  Sreedhar reddy Madireddy Electronically signed by Alean reddy Madireddy Signature Date/Time: 10/22/2023/3:31:34 PM    Final          ______________________________________________________________________________________________      Risk Assessment/Calculations           Physical Exam VS:  BP 120/66 (BP Location: Right Arm, Patient Position: Sitting, Cuff Size: Normal)   Pulse 77   Ht 5' 2 (1.575 m)   Wt 141 lb 6.4 oz (64.1 kg)   SpO2 98%   BMI 25.86 kg/m        Wt Readings from Last 3 Encounters:  08/09/24 141 lb 6.4 oz (64.1 kg)  04/18/24 138 lb (62.6 kg)  03/31/24 138 lb (62.6 kg)    GEN:  Well nourished, well developed in no acute distress NECK: No JVD; No carotid bruits CARDIAC: RRR, no murmurs, rubs, gallops RESPIRATORY:  Clear to auscultation without rales, wheezing or rhonchi  ABDOMEN: Soft, non-tender, non-distended EXTREMITIES:  No edema; No deformity   ASSESSMENT AND PLAN  Preop clearance- Pending colonoscopy. According to the Revised Cardiac Risk Index (RCRI), her Perioperative Risk of Major Cardiac Event is (%): 6.6. Her Functional Capacity in METs is: 4.64 according to the Duke Activity Status Index (DASI). Per AHA/ACC guidelines, she is deemed acceptable risk for the planned procedure without additional cardiovascular testing. Will route to surgical team so they are aware.   May hold Plavix  5 days prior, she has already begun to hold as of 08/06/24  CAD/HLD, LDL goal less than 70- Stable with no anginal symptoms. No indication for ischemic evaluation.  EKG today stable from previous. 09/2023 LDL 88. Rosuvasatin 10mg  three times per week is her max tolerated dose. Has had tolerability issues with multiple medications. Anticipate this will be  reassess at her next PCP visit. Re-discuss with Dr. Liborio at 6  month follow up and consider Zetia or bempedoic acid.  HTN- BP well controlled. Continue current antihypertensive regimen.  Coreg  3.125mg  BID. Discussed to monitor BP at home at least 2 hours after medications and sitting for 5-10 minutes.        Dispo: follow up in 6 months  Signed, Reche GORMAN Finder, NP

## 2024-08-12 ENCOUNTER — Telehealth: Payer: Self-pay | Admitting: Obstetrics and Gynecology

## 2024-08-12 NOTE — Telephone Encounter (Signed)
 Called and spoke to patient: She had a coloscopy yesterday and she reports she is clean as a whistle and that she does not need another colonoscopy.   Patient reports she is not having any issues with her bladder. She states she had a reaction with the Gemtesa , that her knuckles swelled and she had a rash. She states she has not had Gemtesa  or Trospium in over a week and feels she is doing well off everything.   She states she feels her bladder is doing well without anything at this time so she will continue without medication.

## 2024-09-23 ENCOUNTER — Other Ambulatory Visit: Payer: Self-pay

## 2024-10-24 ENCOUNTER — Ambulatory Visit: Admitting: Obstetrics and Gynecology
